# Patient Record
Sex: Male | Born: 1940
Health system: Southern US, Community
[De-identification: ages and names within clinical notes are randomized; demographics above are authoritative.]

## PROBLEM LIST (undated history)

## (undated) DIAGNOSIS — I10 Essential (primary) hypertension: Secondary | ICD-10-CM

## (undated) DIAGNOSIS — F419 Anxiety disorder, unspecified: Secondary | ICD-10-CM

## (undated) DIAGNOSIS — B399 Histoplasmosis, unspecified: Secondary | ICD-10-CM

## (undated) DIAGNOSIS — M4800 Spinal stenosis, site unspecified: Secondary | ICD-10-CM

## (undated) DIAGNOSIS — M1990 Unspecified osteoarthritis, unspecified site: Secondary | ICD-10-CM

## (undated) HISTORY — DX: Histoplasmosis, unspecified: B39.9

## (undated) HISTORY — PX: SHOULDER ARTHROSCOPY: SHX128

---

## 2016-07-20 DIAGNOSIS — H35362 Drusen (degenerative) of macula, left eye: Secondary | ICD-10-CM | POA: Diagnosis not present

## 2016-07-20 DIAGNOSIS — H35051 Retinal neovascularization, unspecified, right eye: Secondary | ICD-10-CM | POA: Diagnosis not present

## 2016-07-20 DIAGNOSIS — H43813 Vitreous degeneration, bilateral: Secondary | ICD-10-CM | POA: Diagnosis not present

## 2016-08-20 DIAGNOSIS — M25561 Pain in right knee: Secondary | ICD-10-CM | POA: Diagnosis not present

## 2016-08-20 DIAGNOSIS — R739 Hyperglycemia, unspecified: Secondary | ICD-10-CM | POA: Diagnosis not present

## 2016-08-20 DIAGNOSIS — I10 Essential (primary) hypertension: Secondary | ICD-10-CM | POA: Diagnosis not present

## 2016-08-20 DIAGNOSIS — Z79899 Other long term (current) drug therapy: Secondary | ICD-10-CM | POA: Diagnosis not present

## 2016-08-20 DIAGNOSIS — E559 Vitamin D deficiency, unspecified: Secondary | ICD-10-CM | POA: Diagnosis not present

## 2016-08-20 DIAGNOSIS — E7800 Pure hypercholesterolemia, unspecified: Secondary | ICD-10-CM | POA: Diagnosis not present

## 2016-11-10 DIAGNOSIS — H35051 Retinal neovascularization, unspecified, right eye: Secondary | ICD-10-CM | POA: Diagnosis not present

## 2016-11-10 DIAGNOSIS — H43813 Vitreous degeneration, bilateral: Secondary | ICD-10-CM | POA: Diagnosis not present

## 2016-11-10 DIAGNOSIS — H35362 Drusen (degenerative) of macula, left eye: Secondary | ICD-10-CM | POA: Diagnosis not present

## 2016-11-10 DIAGNOSIS — B399 Histoplasmosis, unspecified: Secondary | ICD-10-CM | POA: Diagnosis not present

## 2016-12-18 DIAGNOSIS — M25561 Pain in right knee: Secondary | ICD-10-CM | POA: Diagnosis not present

## 2016-12-18 DIAGNOSIS — E7800 Pure hypercholesterolemia, unspecified: Secondary | ICD-10-CM | POA: Diagnosis not present

## 2016-12-18 DIAGNOSIS — I10 Essential (primary) hypertension: Secondary | ICD-10-CM | POA: Diagnosis not present

## 2016-12-18 DIAGNOSIS — M25562 Pain in left knee: Secondary | ICD-10-CM | POA: Diagnosis not present

## 2016-12-18 DIAGNOSIS — Z79899 Other long term (current) drug therapy: Secondary | ICD-10-CM | POA: Diagnosis not present

## 2016-12-24 DIAGNOSIS — M7042 Prepatellar bursitis, left knee: Secondary | ICD-10-CM | POA: Diagnosis not present

## 2016-12-24 DIAGNOSIS — M1711 Unilateral primary osteoarthritis, right knee: Secondary | ICD-10-CM | POA: Diagnosis not present

## 2016-12-24 DIAGNOSIS — M11261 Other chondrocalcinosis, right knee: Secondary | ICD-10-CM | POA: Diagnosis not present

## 2016-12-24 DIAGNOSIS — M1712 Unilateral primary osteoarthritis, left knee: Secondary | ICD-10-CM | POA: Diagnosis not present

## 2016-12-24 DIAGNOSIS — M11262 Other chondrocalcinosis, left knee: Secondary | ICD-10-CM | POA: Diagnosis not present

## 2017-01-25 DIAGNOSIS — M94261 Chondromalacia, right knee: Secondary | ICD-10-CM | POA: Diagnosis not present

## 2017-01-25 DIAGNOSIS — M7042 Prepatellar bursitis, left knee: Secondary | ICD-10-CM | POA: Diagnosis not present

## 2017-04-18 DIAGNOSIS — I10 Essential (primary) hypertension: Secondary | ICD-10-CM | POA: Diagnosis not present

## 2017-04-18 DIAGNOSIS — F5104 Psychophysiologic insomnia: Secondary | ICD-10-CM | POA: Diagnosis not present

## 2017-04-18 DIAGNOSIS — Z79899 Other long term (current) drug therapy: Secondary | ICD-10-CM | POA: Diagnosis not present

## 2017-04-18 DIAGNOSIS — E7800 Pure hypercholesterolemia, unspecified: Secondary | ICD-10-CM | POA: Diagnosis not present

## 2017-04-27 DIAGNOSIS — H31011 Macula scars of posterior pole (postinflammatory) (post-traumatic), right eye: Secondary | ICD-10-CM | POA: Diagnosis not present

## 2017-04-27 DIAGNOSIS — B399 Histoplasmosis, unspecified: Secondary | ICD-10-CM | POA: Diagnosis not present

## 2017-04-27 DIAGNOSIS — H43813 Vitreous degeneration, bilateral: Secondary | ICD-10-CM | POA: Diagnosis not present

## 2017-04-27 DIAGNOSIS — H35362 Drusen (degenerative) of macula, left eye: Secondary | ICD-10-CM | POA: Diagnosis not present

## 2017-06-23 DIAGNOSIS — M170 Bilateral primary osteoarthritis of knee: Secondary | ICD-10-CM | POA: Diagnosis not present

## 2017-06-23 DIAGNOSIS — M659 Synovitis and tenosynovitis, unspecified: Secondary | ICD-10-CM | POA: Diagnosis not present

## 2017-06-23 DIAGNOSIS — M25461 Effusion, right knee: Secondary | ICD-10-CM | POA: Diagnosis not present

## 2017-07-27 DIAGNOSIS — H35051 Retinal neovascularization, unspecified, right eye: Secondary | ICD-10-CM | POA: Diagnosis not present

## 2017-07-27 DIAGNOSIS — B399 Histoplasmosis, unspecified: Secondary | ICD-10-CM | POA: Diagnosis not present

## 2017-07-27 DIAGNOSIS — H35362 Drusen (degenerative) of macula, left eye: Secondary | ICD-10-CM | POA: Diagnosis not present

## 2017-07-27 DIAGNOSIS — H43813 Vitreous degeneration, bilateral: Secondary | ICD-10-CM | POA: Diagnosis not present

## 2017-07-28 DIAGNOSIS — M25461 Effusion, right knee: Secondary | ICD-10-CM | POA: Diagnosis not present

## 2017-07-28 DIAGNOSIS — M1711 Unilateral primary osteoarthritis, right knee: Secondary | ICD-10-CM | POA: Diagnosis not present

## 2017-07-28 DIAGNOSIS — M1712 Unilateral primary osteoarthritis, left knee: Secondary | ICD-10-CM | POA: Diagnosis not present

## 2017-08-26 DIAGNOSIS — Z125 Encounter for screening for malignant neoplasm of prostate: Secondary | ICD-10-CM | POA: Diagnosis not present

## 2017-08-26 DIAGNOSIS — I10 Essential (primary) hypertension: Secondary | ICD-10-CM | POA: Diagnosis not present

## 2017-08-26 DIAGNOSIS — E7800 Pure hypercholesterolemia, unspecified: Secondary | ICD-10-CM | POA: Diagnosis not present

## 2017-08-26 DIAGNOSIS — Z79899 Other long term (current) drug therapy: Secondary | ICD-10-CM | POA: Diagnosis not present

## 2017-08-26 DIAGNOSIS — R0602 Shortness of breath: Secondary | ICD-10-CM | POA: Diagnosis not present

## 2017-08-26 DIAGNOSIS — Z0000 Encounter for general adult medical examination without abnormal findings: Secondary | ICD-10-CM | POA: Diagnosis not present

## 2017-09-05 DIAGNOSIS — M1711 Unilateral primary osteoarthritis, right knee: Secondary | ICD-10-CM | POA: Diagnosis not present

## 2017-12-23 DIAGNOSIS — I10 Essential (primary) hypertension: Secondary | ICD-10-CM | POA: Diagnosis not present

## 2017-12-23 DIAGNOSIS — F5104 Psychophysiologic insomnia: Secondary | ICD-10-CM | POA: Diagnosis not present

## 2017-12-23 DIAGNOSIS — Z79899 Other long term (current) drug therapy: Secondary | ICD-10-CM | POA: Diagnosis not present

## 2017-12-23 DIAGNOSIS — E349 Endocrine disorder, unspecified: Secondary | ICD-10-CM | POA: Diagnosis not present

## 2017-12-23 DIAGNOSIS — E7800 Pure hypercholesterolemia, unspecified: Secondary | ICD-10-CM | POA: Diagnosis not present

## 2018-01-25 DIAGNOSIS — H35362 Drusen (degenerative) of macula, left eye: Secondary | ICD-10-CM | POA: Diagnosis not present

## 2018-01-25 DIAGNOSIS — B399 Histoplasmosis, unspecified: Secondary | ICD-10-CM | POA: Diagnosis not present

## 2018-01-25 DIAGNOSIS — H31011 Macula scars of posterior pole (postinflammatory) (post-traumatic), right eye: Secondary | ICD-10-CM | POA: Diagnosis not present

## 2018-01-25 DIAGNOSIS — H35051 Retinal neovascularization, unspecified, right eye: Secondary | ICD-10-CM | POA: Diagnosis not present

## 2018-04-24 DIAGNOSIS — I10 Essential (primary) hypertension: Secondary | ICD-10-CM | POA: Diagnosis not present

## 2018-04-24 DIAGNOSIS — F5104 Psychophysiologic insomnia: Secondary | ICD-10-CM | POA: Diagnosis not present

## 2018-04-24 DIAGNOSIS — E349 Endocrine disorder, unspecified: Secondary | ICD-10-CM | POA: Diagnosis not present

## 2018-04-24 DIAGNOSIS — Z79899 Other long term (current) drug therapy: Secondary | ICD-10-CM | POA: Diagnosis not present

## 2018-04-24 DIAGNOSIS — E7800 Pure hypercholesterolemia, unspecified: Secondary | ICD-10-CM | POA: Diagnosis not present

## 2018-05-19 DIAGNOSIS — H35051 Retinal neovascularization, unspecified, right eye: Secondary | ICD-10-CM | POA: Diagnosis not present

## 2018-05-19 DIAGNOSIS — H35362 Drusen (degenerative) of macula, left eye: Secondary | ICD-10-CM | POA: Diagnosis not present

## 2018-05-19 DIAGNOSIS — H43813 Vitreous degeneration, bilateral: Secondary | ICD-10-CM | POA: Diagnosis not present

## 2018-05-19 DIAGNOSIS — B399 Histoplasmosis, unspecified: Secondary | ICD-10-CM | POA: Diagnosis not present

## 2018-06-21 DIAGNOSIS — H35051 Retinal neovascularization, unspecified, right eye: Secondary | ICD-10-CM | POA: Diagnosis not present

## 2018-06-21 DIAGNOSIS — B399 Histoplasmosis, unspecified: Secondary | ICD-10-CM | POA: Diagnosis not present

## 2018-06-21 DIAGNOSIS — H43813 Vitreous degeneration, bilateral: Secondary | ICD-10-CM | POA: Diagnosis not present

## 2018-06-21 DIAGNOSIS — H35362 Drusen (degenerative) of macula, left eye: Secondary | ICD-10-CM | POA: Diagnosis not present

## 2018-08-01 DIAGNOSIS — I10 Essential (primary) hypertension: Secondary | ICD-10-CM | POA: Diagnosis not present

## 2018-08-01 DIAGNOSIS — E7800 Pure hypercholesterolemia, unspecified: Secondary | ICD-10-CM | POA: Diagnosis not present

## 2018-08-01 DIAGNOSIS — F5104 Psychophysiologic insomnia: Secondary | ICD-10-CM | POA: Diagnosis not present

## 2018-08-01 DIAGNOSIS — Z79899 Other long term (current) drug therapy: Secondary | ICD-10-CM | POA: Diagnosis not present

## 2018-08-01 DIAGNOSIS — F339 Major depressive disorder, recurrent, unspecified: Secondary | ICD-10-CM | POA: Diagnosis not present

## 2018-08-02 DIAGNOSIS — B399 Histoplasmosis, unspecified: Secondary | ICD-10-CM | POA: Diagnosis not present

## 2018-08-02 DIAGNOSIS — H31011 Macula scars of posterior pole (postinflammatory) (post-traumatic), right eye: Secondary | ICD-10-CM | POA: Diagnosis not present

## 2018-08-02 DIAGNOSIS — H35051 Retinal neovascularization, unspecified, right eye: Secondary | ICD-10-CM | POA: Diagnosis not present

## 2018-08-02 DIAGNOSIS — H35362 Drusen (degenerative) of macula, left eye: Secondary | ICD-10-CM | POA: Diagnosis not present

## 2018-09-22 DIAGNOSIS — B399 Histoplasmosis, unspecified: Secondary | ICD-10-CM | POA: Diagnosis not present

## 2018-09-22 DIAGNOSIS — H35051 Retinal neovascularization, unspecified, right eye: Secondary | ICD-10-CM | POA: Diagnosis not present

## 2018-09-22 DIAGNOSIS — H31011 Macula scars of posterior pole (postinflammatory) (post-traumatic), right eye: Secondary | ICD-10-CM | POA: Diagnosis not present

## 2018-09-22 DIAGNOSIS — H35362 Drusen (degenerative) of macula, left eye: Secondary | ICD-10-CM | POA: Diagnosis not present

## 2018-11-07 DIAGNOSIS — Z79899 Other long term (current) drug therapy: Secondary | ICD-10-CM | POA: Diagnosis not present

## 2018-11-07 DIAGNOSIS — Z125 Encounter for screening for malignant neoplasm of prostate: Secondary | ICD-10-CM | POA: Diagnosis not present

## 2018-11-07 DIAGNOSIS — E7800 Pure hypercholesterolemia, unspecified: Secondary | ICD-10-CM | POA: Diagnosis not present

## 2018-11-07 DIAGNOSIS — R0602 Shortness of breath: Secondary | ICD-10-CM | POA: Diagnosis not present

## 2018-11-07 DIAGNOSIS — I10 Essential (primary) hypertension: Secondary | ICD-10-CM | POA: Diagnosis not present

## 2018-11-07 DIAGNOSIS — Z0000 Encounter for general adult medical examination without abnormal findings: Secondary | ICD-10-CM | POA: Diagnosis not present

## 2019-02-27 DIAGNOSIS — H43813 Vitreous degeneration, bilateral: Secondary | ICD-10-CM | POA: Diagnosis not present

## 2019-02-27 DIAGNOSIS — H35051 Retinal neovascularization, unspecified, right eye: Secondary | ICD-10-CM | POA: Diagnosis not present

## 2019-02-27 DIAGNOSIS — H31011 Macula scars of posterior pole (postinflammatory) (post-traumatic), right eye: Secondary | ICD-10-CM | POA: Diagnosis not present

## 2019-02-27 DIAGNOSIS — B399 Histoplasmosis, unspecified: Secondary | ICD-10-CM | POA: Diagnosis not present

## 2019-03-09 DIAGNOSIS — I10 Essential (primary) hypertension: Secondary | ICD-10-CM | POA: Diagnosis not present

## 2019-03-09 DIAGNOSIS — E7800 Pure hypercholesterolemia, unspecified: Secondary | ICD-10-CM | POA: Diagnosis not present

## 2019-03-09 DIAGNOSIS — F5104 Psychophysiologic insomnia: Secondary | ICD-10-CM | POA: Diagnosis not present

## 2019-03-09 DIAGNOSIS — Z79899 Other long term (current) drug therapy: Secondary | ICD-10-CM | POA: Diagnosis not present

## 2019-03-09 DIAGNOSIS — D539 Nutritional anemia, unspecified: Secondary | ICD-10-CM | POA: Diagnosis not present

## 2019-03-09 DIAGNOSIS — B029 Zoster without complications: Secondary | ICD-10-CM | POA: Diagnosis not present

## 2019-03-09 DIAGNOSIS — Z1159 Encounter for screening for other viral diseases: Secondary | ICD-10-CM | POA: Diagnosis not present

## 2019-06-22 DIAGNOSIS — B399 Histoplasmosis, unspecified: Secondary | ICD-10-CM | POA: Diagnosis not present

## 2019-06-22 DIAGNOSIS — H35051 Retinal neovascularization, unspecified, right eye: Secondary | ICD-10-CM | POA: Diagnosis not present

## 2019-07-09 DIAGNOSIS — Z79899 Other long term (current) drug therapy: Secondary | ICD-10-CM | POA: Diagnosis not present

## 2019-07-09 DIAGNOSIS — I10 Essential (primary) hypertension: Secondary | ICD-10-CM | POA: Diagnosis not present

## 2019-07-09 DIAGNOSIS — M129 Arthropathy, unspecified: Secondary | ICD-10-CM | POA: Diagnosis not present

## 2019-07-09 DIAGNOSIS — E7800 Pure hypercholesterolemia, unspecified: Secondary | ICD-10-CM | POA: Diagnosis not present

## 2019-07-09 DIAGNOSIS — Z1159 Encounter for screening for other viral diseases: Secondary | ICD-10-CM | POA: Diagnosis not present

## 2019-10-24 DIAGNOSIS — H31011 Macula scars of posterior pole (postinflammatory) (post-traumatic), right eye: Secondary | ICD-10-CM | POA: Diagnosis not present

## 2019-10-24 DIAGNOSIS — H35051 Retinal neovascularization, unspecified, right eye: Secondary | ICD-10-CM | POA: Diagnosis not present

## 2019-10-24 DIAGNOSIS — H35362 Drusen (degenerative) of macula, left eye: Secondary | ICD-10-CM | POA: Diagnosis not present

## 2019-10-24 DIAGNOSIS — B399 Histoplasmosis, unspecified: Secondary | ICD-10-CM | POA: Diagnosis not present

## 2019-11-27 DIAGNOSIS — Z1331 Encounter for screening for depression: Secondary | ICD-10-CM | POA: Diagnosis not present

## 2019-11-27 DIAGNOSIS — E7800 Pure hypercholesterolemia, unspecified: Secondary | ICD-10-CM | POA: Diagnosis not present

## 2019-11-27 DIAGNOSIS — Z0000 Encounter for general adult medical examination without abnormal findings: Secondary | ICD-10-CM | POA: Diagnosis not present

## 2019-11-27 DIAGNOSIS — Z1339 Encounter for screening examination for other mental health and behavioral disorders: Secondary | ICD-10-CM | POA: Diagnosis not present

## 2019-11-27 DIAGNOSIS — Z1159 Encounter for screening for other viral diseases: Secondary | ICD-10-CM | POA: Diagnosis not present

## 2019-11-27 DIAGNOSIS — Z125 Encounter for screening for malignant neoplasm of prostate: Secondary | ICD-10-CM | POA: Diagnosis not present

## 2019-11-27 DIAGNOSIS — Z79899 Other long term (current) drug therapy: Secondary | ICD-10-CM | POA: Diagnosis not present

## 2019-11-27 DIAGNOSIS — I10 Essential (primary) hypertension: Secondary | ICD-10-CM | POA: Diagnosis not present

## 2019-11-27 DIAGNOSIS — R0602 Shortness of breath: Secondary | ICD-10-CM | POA: Diagnosis not present

## 2020-03-28 DIAGNOSIS — M4800 Spinal stenosis, site unspecified: Secondary | ICD-10-CM | POA: Diagnosis not present

## 2020-03-28 DIAGNOSIS — Z79899 Other long term (current) drug therapy: Secondary | ICD-10-CM | POA: Diagnosis not present

## 2020-03-28 DIAGNOSIS — G479 Sleep disorder, unspecified: Secondary | ICD-10-CM | POA: Diagnosis not present

## 2020-03-28 DIAGNOSIS — I10 Essential (primary) hypertension: Secondary | ICD-10-CM | POA: Diagnosis not present

## 2020-03-28 DIAGNOSIS — G629 Polyneuropathy, unspecified: Secondary | ICD-10-CM | POA: Diagnosis not present

## 2020-05-21 DIAGNOSIS — M5136 Other intervertebral disc degeneration, lumbar region: Secondary | ICD-10-CM | POA: Diagnosis not present

## 2020-05-21 DIAGNOSIS — M545 Low back pain, unspecified: Secondary | ICD-10-CM | POA: Diagnosis not present

## 2020-05-27 DIAGNOSIS — G8929 Other chronic pain: Secondary | ICD-10-CM | POA: Diagnosis not present

## 2020-05-27 DIAGNOSIS — M545 Low back pain, unspecified: Secondary | ICD-10-CM | POA: Diagnosis not present

## 2020-06-03 DIAGNOSIS — G8929 Other chronic pain: Secondary | ICD-10-CM | POA: Diagnosis not present

## 2020-06-03 DIAGNOSIS — M545 Low back pain, unspecified: Secondary | ICD-10-CM | POA: Diagnosis not present

## 2020-06-09 DIAGNOSIS — M545 Low back pain, unspecified: Secondary | ICD-10-CM | POA: Diagnosis not present

## 2020-06-09 DIAGNOSIS — G8929 Other chronic pain: Secondary | ICD-10-CM | POA: Diagnosis not present

## 2020-08-10 DIAGNOSIS — I10 Essential (primary) hypertension: Secondary | ICD-10-CM | POA: Diagnosis not present

## 2020-08-10 DIAGNOSIS — G479 Sleep disorder, unspecified: Secondary | ICD-10-CM | POA: Diagnosis not present

## 2020-08-10 DIAGNOSIS — Z1159 Encounter for screening for other viral diseases: Secondary | ICD-10-CM | POA: Diagnosis not present

## 2020-08-10 DIAGNOSIS — G629 Polyneuropathy, unspecified: Secondary | ICD-10-CM | POA: Diagnosis not present

## 2020-08-10 DIAGNOSIS — Z79899 Other long term (current) drug therapy: Secondary | ICD-10-CM | POA: Diagnosis not present

## 2020-08-10 DIAGNOSIS — M4800 Spinal stenosis, site unspecified: Secondary | ICD-10-CM | POA: Diagnosis not present

## 2020-09-24 DIAGNOSIS — S93402A Sprain of unspecified ligament of left ankle, initial encounter: Secondary | ICD-10-CM | POA: Diagnosis not present

## 2020-09-24 DIAGNOSIS — I10 Essential (primary) hypertension: Secondary | ICD-10-CM | POA: Diagnosis not present

## 2020-09-24 DIAGNOSIS — M25572 Pain in left ankle and joints of left foot: Secondary | ICD-10-CM | POA: Diagnosis not present

## 2020-09-24 DIAGNOSIS — M79671 Pain in right foot: Secondary | ICD-10-CM | POA: Diagnosis not present

## 2020-10-21 DIAGNOSIS — M76821 Posterior tibial tendinitis, right leg: Secondary | ICD-10-CM | POA: Diagnosis not present

## 2020-10-21 DIAGNOSIS — M7741 Metatarsalgia, right foot: Secondary | ICD-10-CM | POA: Diagnosis not present

## 2020-10-21 DIAGNOSIS — M7742 Metatarsalgia, left foot: Secondary | ICD-10-CM | POA: Diagnosis not present

## 2020-11-22 DIAGNOSIS — G629 Polyneuropathy, unspecified: Secondary | ICD-10-CM | POA: Diagnosis not present

## 2020-11-22 DIAGNOSIS — Z1159 Encounter for screening for other viral diseases: Secondary | ICD-10-CM | POA: Diagnosis not present

## 2020-11-22 DIAGNOSIS — Z79899 Other long term (current) drug therapy: Secondary | ICD-10-CM | POA: Diagnosis not present

## 2020-11-22 DIAGNOSIS — I10 Essential (primary) hypertension: Secondary | ICD-10-CM | POA: Diagnosis not present

## 2020-11-22 DIAGNOSIS — R0602 Shortness of breath: Secondary | ICD-10-CM | POA: Diagnosis not present

## 2020-11-22 DIAGNOSIS — E559 Vitamin D deficiency, unspecified: Secondary | ICD-10-CM | POA: Diagnosis not present

## 2020-11-22 DIAGNOSIS — E7800 Pure hypercholesterolemia, unspecified: Secondary | ICD-10-CM | POA: Diagnosis not present

## 2020-12-23 DIAGNOSIS — G629 Polyneuropathy, unspecified: Secondary | ICD-10-CM | POA: Diagnosis not present

## 2020-12-23 DIAGNOSIS — F5104 Psychophysiologic insomnia: Secondary | ICD-10-CM | POA: Diagnosis not present

## 2020-12-23 DIAGNOSIS — F339 Major depressive disorder, recurrent, unspecified: Secondary | ICD-10-CM | POA: Diagnosis not present

## 2020-12-23 DIAGNOSIS — M5030 Other cervical disc degeneration, unspecified cervical region: Secondary | ICD-10-CM | POA: Diagnosis not present

## 2021-01-06 DIAGNOSIS — R0602 Shortness of breath: Secondary | ICD-10-CM | POA: Diagnosis not present

## 2021-01-06 DIAGNOSIS — R9431 Abnormal electrocardiogram [ECG] [EKG]: Secondary | ICD-10-CM | POA: Diagnosis not present

## 2021-02-23 DIAGNOSIS — Z79899 Other long term (current) drug therapy: Secondary | ICD-10-CM | POA: Diagnosis not present

## 2021-02-23 DIAGNOSIS — F339 Major depressive disorder, recurrent, unspecified: Secondary | ICD-10-CM | POA: Diagnosis not present

## 2021-02-23 DIAGNOSIS — I10 Essential (primary) hypertension: Secondary | ICD-10-CM | POA: Diagnosis not present

## 2021-02-23 DIAGNOSIS — Z1159 Encounter for screening for other viral diseases: Secondary | ICD-10-CM | POA: Diagnosis not present

## 2021-02-23 DIAGNOSIS — M5030 Other cervical disc degeneration, unspecified cervical region: Secondary | ICD-10-CM | POA: Diagnosis not present

## 2021-02-23 DIAGNOSIS — F5104 Psychophysiologic insomnia: Secondary | ICD-10-CM | POA: Diagnosis not present

## 2021-05-19 DIAGNOSIS — I639 Cerebral infarction, unspecified: Secondary | ICD-10-CM

## 2021-05-19 HISTORY — DX: Cerebral infarction, unspecified: I63.9

## 2021-05-26 DIAGNOSIS — Z1159 Encounter for screening for other viral diseases: Secondary | ICD-10-CM | POA: Diagnosis not present

## 2021-05-26 DIAGNOSIS — M5030 Other cervical disc degeneration, unspecified cervical region: Secondary | ICD-10-CM | POA: Diagnosis not present

## 2021-05-26 DIAGNOSIS — M779 Enthesopathy, unspecified: Secondary | ICD-10-CM | POA: Diagnosis not present

## 2021-05-26 DIAGNOSIS — M129 Arthropathy, unspecified: Secondary | ICD-10-CM | POA: Diagnosis not present

## 2021-05-26 DIAGNOSIS — M545 Low back pain, unspecified: Secondary | ICD-10-CM | POA: Diagnosis not present

## 2021-05-26 DIAGNOSIS — F339 Major depressive disorder, recurrent, unspecified: Secondary | ICD-10-CM | POA: Diagnosis not present

## 2021-05-26 DIAGNOSIS — I10 Essential (primary) hypertension: Secondary | ICD-10-CM | POA: Diagnosis not present

## 2021-05-26 DIAGNOSIS — R030 Elevated blood-pressure reading, without diagnosis of hypertension: Secondary | ICD-10-CM | POA: Diagnosis not present

## 2021-05-26 DIAGNOSIS — R202 Paresthesia of skin: Secondary | ICD-10-CM | POA: Diagnosis not present

## 2021-05-26 DIAGNOSIS — M79671 Pain in right foot: Secondary | ICD-10-CM | POA: Diagnosis not present

## 2021-05-26 DIAGNOSIS — Z79899 Other long term (current) drug therapy: Secondary | ICD-10-CM | POA: Diagnosis not present

## 2021-05-26 DIAGNOSIS — F5104 Psychophysiologic insomnia: Secondary | ICD-10-CM | POA: Diagnosis not present

## 2021-05-26 DIAGNOSIS — G8929 Other chronic pain: Secondary | ICD-10-CM | POA: Diagnosis not present

## 2021-06-09 DIAGNOSIS — G8929 Other chronic pain: Secondary | ICD-10-CM | POA: Diagnosis not present

## 2021-06-09 DIAGNOSIS — M545 Low back pain, unspecified: Secondary | ICD-10-CM | POA: Diagnosis not present

## 2021-06-09 DIAGNOSIS — M79671 Pain in right foot: Secondary | ICD-10-CM | POA: Diagnosis not present

## 2021-06-09 DIAGNOSIS — M779 Enthesopathy, unspecified: Secondary | ICD-10-CM | POA: Diagnosis not present

## 2021-06-09 DIAGNOSIS — I10 Essential (primary) hypertension: Secondary | ICD-10-CM | POA: Diagnosis not present

## 2021-06-15 ENCOUNTER — Encounter (HOSPITAL_BASED_OUTPATIENT_CLINIC_OR_DEPARTMENT_OTHER): Payer: Self-pay | Admitting: Urology

## 2021-06-15 ENCOUNTER — Other Ambulatory Visit: Payer: Self-pay

## 2021-06-15 ENCOUNTER — Emergency Department (HOSPITAL_BASED_OUTPATIENT_CLINIC_OR_DEPARTMENT_OTHER): Payer: PPO

## 2021-06-15 ENCOUNTER — Inpatient Hospital Stay (HOSPITAL_BASED_OUTPATIENT_CLINIC_OR_DEPARTMENT_OTHER)
Admission: EM | Admit: 2021-06-15 | Discharge: 2021-06-19 | DRG: 065 | Disposition: A | Discharge status: Skilled Nursing Facility | Payer: PPO | Source: Ambulatory Visit | Attending: Internal Medicine | Admitting: Internal Medicine

## 2021-06-15 DIAGNOSIS — E785 Hyperlipidemia, unspecified: Secondary | ICD-10-CM | POA: Diagnosis present

## 2021-06-15 DIAGNOSIS — Z20822 Contact with and (suspected) exposure to covid-19: Secondary | ICD-10-CM | POA: Diagnosis present

## 2021-06-15 DIAGNOSIS — M4800 Spinal stenosis, site unspecified: Secondary | ICD-10-CM | POA: Diagnosis present

## 2021-06-15 DIAGNOSIS — I6523 Occlusion and stenosis of bilateral carotid arteries: Secondary | ICD-10-CM | POA: Diagnosis present

## 2021-06-15 DIAGNOSIS — Z888 Allergy status to other drugs, medicaments and biological substances status: Secondary | ICD-10-CM

## 2021-06-15 DIAGNOSIS — I082 Rheumatic disorders of both aortic and tricuspid valves: Secondary | ICD-10-CM | POA: Diagnosis present

## 2021-06-15 DIAGNOSIS — I672 Cerebral atherosclerosis: Secondary | ICD-10-CM | POA: Diagnosis present

## 2021-06-15 DIAGNOSIS — I639 Cerebral infarction, unspecified: Secondary | ICD-10-CM | POA: Diagnosis not present

## 2021-06-15 DIAGNOSIS — G4700 Insomnia, unspecified: Secondary | ICD-10-CM | POA: Diagnosis present

## 2021-06-15 DIAGNOSIS — R531 Weakness: Secondary | ICD-10-CM | POA: Insufficient documentation

## 2021-06-15 DIAGNOSIS — R911 Solitary pulmonary nodule: Secondary | ICD-10-CM | POA: Diagnosis present

## 2021-06-15 DIAGNOSIS — G8194 Hemiplegia, unspecified affecting left nondominant side: Secondary | ICD-10-CM | POA: Diagnosis present

## 2021-06-15 DIAGNOSIS — R29703 NIHSS score 3: Secondary | ICD-10-CM | POA: Diagnosis present

## 2021-06-15 DIAGNOSIS — M6281 Muscle weakness (generalized): Secondary | ICD-10-CM | POA: Diagnosis not present

## 2021-06-15 DIAGNOSIS — I6330 Cerebral infarction due to thrombosis of unspecified cerebral artery: Secondary | ICD-10-CM

## 2021-06-15 DIAGNOSIS — M1990 Unspecified osteoarthritis, unspecified site: Secondary | ICD-10-CM | POA: Diagnosis present

## 2021-06-15 DIAGNOSIS — I6381 Other cerebral infarction due to occlusion or stenosis of small artery: Secondary | ICD-10-CM | POA: Diagnosis not present

## 2021-06-15 DIAGNOSIS — Z9181 History of falling: Secondary | ICD-10-CM

## 2021-06-15 DIAGNOSIS — I10 Essential (primary) hypertension: Secondary | ICD-10-CM | POA: Diagnosis present

## 2021-06-15 DIAGNOSIS — Z880 Allergy status to penicillin: Secondary | ICD-10-CM

## 2021-06-15 DIAGNOSIS — F32A Depression, unspecified: Secondary | ICD-10-CM | POA: Diagnosis present

## 2021-06-15 DIAGNOSIS — R9431 Abnormal electrocardiogram [ECG] [EKG]: Secondary | ICD-10-CM | POA: Diagnosis not present

## 2021-06-15 DIAGNOSIS — Z885 Allergy status to narcotic agent status: Secondary | ICD-10-CM

## 2021-06-15 DIAGNOSIS — R29898 Other symptoms and signs involving the musculoskeletal system: Secondary | ICD-10-CM

## 2021-06-15 DIAGNOSIS — F419 Anxiety disorder, unspecified: Secondary | ICD-10-CM | POA: Diagnosis present

## 2021-06-15 DIAGNOSIS — M25552 Pain in left hip: Secondary | ICD-10-CM | POA: Diagnosis not present

## 2021-06-15 HISTORY — DX: Essential (primary) hypertension: I10

## 2021-06-15 HISTORY — DX: Anxiety disorder, unspecified: F41.9

## 2021-06-15 HISTORY — DX: Unspecified osteoarthritis, unspecified site: M19.90

## 2021-06-15 HISTORY — DX: Spinal stenosis, site unspecified: M48.00

## 2021-06-15 LAB — CBC WITH DIFFERENTIAL/PLATELET
Abs Immature Granulocytes: 0.01 10*3/uL (ref 0.00–0.07)
Basophils Absolute: 0 10*3/uL (ref 0.0–0.1)
Basophils Relative: 1 %
Eosinophils Absolute: 0 10*3/uL (ref 0.0–0.5)
Eosinophils Relative: 0 %
HCT: 41.7 % (ref 39.0–52.0)
Hemoglobin: 14.4 g/dL (ref 13.0–17.0)
Immature Granulocytes: 0 %
Lymphocytes Relative: 9 %
Lymphs Abs: 0.4 10*3/uL — ABNORMAL LOW (ref 0.7–4.0)
MCH: 34.1 pg — ABNORMAL HIGH (ref 26.0–34.0)
MCHC: 34.5 g/dL (ref 30.0–36.0)
MCV: 98.8 fL (ref 80.0–100.0)
Monocytes Absolute: 0.3 10*3/uL (ref 0.1–1.0)
Monocytes Relative: 6 %
Neutro Abs: 3.7 10*3/uL (ref 1.7–7.7)
Neutrophils Relative %: 84 %
Platelets: 161 10*3/uL (ref 150–400)
RBC: 4.22 MIL/uL (ref 4.22–5.81)
RDW: 11.7 % (ref 11.5–15.5)
WBC: 4.4 10*3/uL (ref 4.0–10.5)
nRBC: 0 % (ref 0.0–0.2)

## 2021-06-15 LAB — URINALYSIS, ROUTINE W REFLEX MICROSCOPIC
Bilirubin Urine: NEGATIVE
Glucose, UA: NEGATIVE mg/dL
Hgb urine dipstick: NEGATIVE
Ketones, ur: 15 mg/dL — AB
Leukocytes,Ua: NEGATIVE
Nitrite: NEGATIVE
Protein, ur: NEGATIVE mg/dL
Specific Gravity, Urine: 1.03 (ref 1.005–1.030)
pH: 5.5 (ref 5.0–8.0)

## 2021-06-15 LAB — RESP PANEL BY RT-PCR (FLU A&B, COVID) ARPGX2
Influenza A by PCR: NEGATIVE
Influenza B by PCR: NEGATIVE
SARS Coronavirus 2 by RT PCR: NEGATIVE

## 2021-06-15 LAB — COMPREHENSIVE METABOLIC PANEL
ALT: 34 U/L (ref 0–44)
AST: 34 U/L (ref 15–41)
Albumin: 4.5 g/dL (ref 3.5–5.0)
Alkaline Phosphatase: 87 U/L (ref 38–126)
Anion gap: 9 (ref 5–15)
BUN: 19 mg/dL (ref 8–23)
CO2: 27 mmol/L (ref 22–32)
Calcium: 9.5 mg/dL (ref 8.9–10.3)
Chloride: 101 mmol/L (ref 98–111)
Creatinine, Ser: 0.81 mg/dL (ref 0.61–1.24)
GFR, Estimated: >60 mL/min (ref >60)
Glucose, Bld: 119 mg/dL — ABNORMAL HIGH (ref 70–99)
Potassium: 4.2 mmol/L (ref 3.5–5.1)
Sodium: 137 mmol/L (ref 135–145)
Total Bilirubin: 0.4 mg/dL (ref 0.3–1.2)
Total Protein: 7.4 g/dL (ref 6.5–8.1)

## 2021-06-15 LAB — ETHANOL: Alcohol, Ethyl (B): <10 mg/dL (ref <10)

## 2021-06-15 LAB — RAPID URINE DRUG SCREEN, HOSP PERFORMED
Amphetamines: NOT DETECTED
Barbiturates: NOT DETECTED
Benzodiazepines: POSITIVE — AB
Cocaine: NOT DETECTED
Opiates: NOT DETECTED
Tetrahydrocannabinol: NOT DETECTED

## 2021-06-15 LAB — PROTIME-INR
INR: 1.1 (ref 0.8–1.2)
Prothrombin Time: 13.9 seconds (ref 11.4–15.2)

## 2021-06-15 LAB — APTT: aPTT: 26 seconds (ref 24–36)

## 2021-06-15 MED ORDER — IOHEXOL 350 MG/ML SOLN
80.0000 mL | Freq: Once | INTRAVENOUS | Status: AC | PRN
  Administered 2021-06-15: 20:00:00 80 mL via INTRAVENOUS

## 2021-06-15 MED ORDER — ALPRAZOLAM 0.5 MG PO TABS
1.0000 mg | ORAL_TABLET | Freq: Once | ORAL | Status: AC
  Administered 2021-06-15: 22:00:00 1 mg via ORAL
  Filled 2021-06-15: qty 2

## 2021-06-15 NOTE — ED Notes (Signed)
Pt care taken, awaiting ct and x ray, no complaints at this time

## 2021-06-15 NOTE — ED Triage Notes (Signed)
Fall yesterday, states tingling and numbness to legs x 1 year.  States numbness to left leg made him fall, c/o left hip pain, unable to stand at this time, c/o left hip pain.   NAD at this time.

## 2021-06-15 NOTE — Progress Notes (Signed)
  [facility] will assume care on arrival to accepting facility. Until arrival, care as per EDP. However, [facility] available 24/7 for questions and assistance.   Nursing staff please page [facility] Admits and Consults ([phone]) as soon as the patient arrives to the hospital.  James Lafalce, DO [facility]  

## 2021-06-15 NOTE — ED Provider Notes (Signed)
Emergency Department Provider Note   I have reviewed the triage vital signs and the nursing notes.   HISTORY  Chief Complaint Fall   HPI Charles Hale is a 80 y.o. male presents to the ED with new onset left leg weakness and fall.  Patient tells me that he has had intermittent tingling/numbness to the feet primarily over the past year but he awoke yesterday with significant weakness in the left leg and difficulty walking.  He was able to use his elliptical machine the day before and walk without difficulty.  He is not experiencing left arm weakness.  He does note some tingling at times.  He is not having worsening numbness.  No back or neck pain.  No groin numbness. He had a fall today due to his difficulty walking and was sent here from his PCP with concern for CVA. No speech or vision change. No gait instability prior to yesterday.   Past Medical History:  Diagnosis Date   Anxiety    Arthritis    Hypertension    Spinal stenosis     Patient Active Problem List   Diagnosis Date Noted   Cerebral thrombosis with cerebral infarction 06/17/2021   Essential hypertension 06/16/2021   Depression 06/16/2021   Osteoarthritis 06/16/2021   Insomnia 06/16/2021   Acute left-sided weakness 06/15/2021    Past Surgical History:  Procedure Laterality Date   SHOULDER ARTHROSCOPY      Allergies Codeine, Penicillins, and Terbinafine hcl  History reviewed. No pertinent family history.  Social History Social History   Tobacco Use   Smoking status: Never    Passive exposure: Never   Smokeless tobacco: Never  Substance Use Topics   Alcohol use: Yes    Comment: occ   Drug use: Never    Review of Systems  Constitutional: No fever/chills Eyes: No visual changes. ENT: No sore throat. Cardiovascular: Denies chest pain. Respiratory: Denies shortness of breath. Gastrointestinal: No abdominal pain.  No nausea, no vomiting.  No diarrhea.  No constipation. Genitourinary: Negative  for dysuria. Musculoskeletal: Negative for back pain. Skin: Negative for rash. Neurological: Negative for headaches. Chronic bilateral foot tingling and left hand numbness intermittently. Left leg weakness and difficulty walking.   10-point ROS otherwise negative.  ____________________________________________   PHYSICAL EXAM:  VITAL SIGNS: ED Triage Vitals  Enc Vitals Group     BP 06/15/21 1338 (!) 167/77     Pulse Rate 06/15/21 1338 81     Resp 06/15/21 1338 18     Temp 06/15/21 1338 98 F (36.7 C)     Temp Source 06/15/21 1338 Oral     SpO2 06/15/21 1338 100 %     Weight 06/15/21 1333 182 lb (82.6 kg)     Height 06/15/21 1333 6\' 1"  (1.854 m)   Constitutional: Alert and oriented. Well appearing and in no acute distress. Eyes: Conjunctivae are normal.  Head: Atraumatic. Nose: No congestion/rhinnorhea. Mouth/Throat: Mucous membranes are moist.   Neck: No stridor.   Cardiovascular: Normal rate, regular rhythm. Good peripheral circulation. Grossly normal heart sounds.   Respiratory: Normal respiratory effort.  No retractions. Lungs CTAB. Gastrointestinal: Soft and nontender. No distention.  Musculoskeletal: No lower extremity tenderness nor edema. No gross deformities of extremities. Neurologic:  Normal speech and language. Left leg weakness and gait instability. No facial asymmetry or sensory deficit. Slight pronator drift in the LUE.  Skin:  Skin is warm, dry and intact. No rash noted.   ____________________________________________   LABS (all labs  ordered are listed, but only abnormal results are displayed)  Labs Reviewed  DIFFERENTIAL - Abnormal; Notable for the following components:      Result Value   Lymphs Abs 0.4 (*)    All other components within normal limits  COMPREHENSIVE METABOLIC PANEL - Abnormal; Notable for the following components:   Glucose, Bld 119 (*)    All other components within normal limits  RAPID URINE DRUG SCREEN, HOSP PERFORMED - Abnormal;  Notable for the following components:   Benzodiazepines POSITIVE (*)    All other components within normal limits  URINALYSIS, ROUTINE W REFLEX MICROSCOPIC - Abnormal; Notable for the following components:   Ketones, ur 15 (*)    All other components within normal limits  CBC WITH DIFFERENTIAL/PLATELET - Abnormal; Notable for the following components:   MCH 34.1 (*)    Lymphs Abs 0.4 (*)    All other components within normal limits  RESP PANEL BY RT-PCR (FLU A&B, COVID) ARPGX2  ETHANOL  PROTIME-INR  APTT  CBC  TSH  HEMOGLOBIN A1C  LIPID PANEL   ____________________________________________  EKG  EKG reviewed. Sinus rhythm. LAFB. Normal T waves. No STEMI.    ____________________________________________  RADIOLOGY  DG Hip Unilat With Pelvis 2-3 Views Left  Result Date: 06/15/2021 CLINICAL DATA:  Fall.  LEFT hip pain EXAM: DG HIP (WITH OR WITHOUT PELVIS) 2-3V LEFT COMPARISON:  None. FINDINGS: Hips are located. No evidence of pelvic fracture or sacral fracture. Dedicated view of the LEFT hip demonstrates no femoral neck fracture. IMPRESSION: No pelvic fracture or hip fracture Electronically Signed   By: Genevive Bi M.D.   On: 06/15/2021 14:22    CT head without acute hemorrhage. CTA reviewed with multiple vascular findings but no aneurysm or hemorrhage. NPH noted on differential as well.  ____________________________________________   PROCEDURES  Procedure(s) performed:   Procedures  None  ____________________________________________   INITIAL IMPRESSION / ASSESSMENT AND PLAN / ED COURSE  Pertinent labs & imaging results that were available during my care of the patient were reviewed by me and considered in my medical decision making (see chart for details).   Patient presents to the emergency department with leg weakness after waking yesterday.  He is outside of any stroke window.  Describes what sounds like neuropathy over the past year with symptoms today are  much different from his baseline neuropathy symptoms.  On exam he does seem weak in the left leg compared to the right with slight pronator drift in the left upper extremity.  No back or neck pain to strongly suggest spine issues.   Discussed with Dr. Wilford Corner who can place the patient on the consult list but requests TRH make him/team aware when patient arrives. Will need MRI.   Discussed patient's case with TR[facility] to request admission. Patient and family (if present) updated with plan. Care transferred to [facility] service.  I reviewed all nursing notes, vitals, pertinent old records, EKGs, labs, imaging (as available).  ____________________________________________  FINAL CLINICAL IMPRESSION(S) / ED DIAGNOSES  Final diagnoses:  Left leg weakness     MEDICATIONS GIVEN DURING THIS VISIT:  Medications  acetaminophen (TYLENOL) tablet 650 mg (has no administration in time range)    Or  acetaminophen (TYLENOL) 160 MG/5ML solution 650 mg (has no administration in time range)    Or  acetaminophen (TYLENOL) suppository 650 mg (has no administration in time range)  senna-docusate (Senokot-S) tablet 1 tablet (has no administration in time range)  enoxaparin (LOVENOX) injection 40 mg (40 mg  Subcutaneous Given 06/17/21 2005)  hydrALAZINE (APRESOLINE) injection 5 mg (has no administration in time range)  acetaminophen (TYLENOL) 500 MG tablet (has no administration in time range)  ALPRAZolam (XANAX) tablet 1-2 mg (1 mg Oral Given 06/18/21 0314)  sertraline (ZOLOFT) tablet 100 mg (100 mg Oral Given 06/17/21 2129)  traMADol (ULTRAM) tablet 50 mg (has no administration in time range)  clopidogrel (PLAVIX) tablet 75 mg (75 mg Oral Given 06/17/21 1119)  sertraline (ZOLOFT) 100 MG tablet (  Not Given 06/16/21 2219)  aspirin EC 81 MG tablet (  Not Given 06/16/21 2221)  enoxaparin (LOVENOX) 40 MG/0.4ML injection (  Not Given 06/16/21 2220)   stroke: mapping our early stages of recovery book (  Not Given 06/16/21  2221)  amLODipine (NORVASC) tablet 10 mg (10 mg Oral Given 06/17/21 1120)  atorvastatin (LIPITOR) tablet 40 mg (40 mg Oral Given 06/17/21 1120)  aspirin EC tablet 81 mg (81 mg Oral Given 06/17/21 1119)  iohexol (OMNIPAQUE) 350 MG/ML injection 80 mL (80 mLs Intravenous Contrast Given 06/15/21 2002)  ALPRAZolam (XANAX) tablet 1 mg (1 mg Oral Given 06/15/21 2207)  acetaminophen (TYLENOL) tablet 1,000 mg (1,000 mg Oral Given 06/16/21 0344)   stroke: mapping our early stages of recovery book ( Does not apply Given 06/16/21 2201)     Note:  This document was prepared using Dragon voice recognition software and may include unintentional dictation errors.  Alona Bene, MD, [facility]    Cordney Barstow, Arlyss Repress, MD 06/18/21 [phone]

## 2021-06-16 ENCOUNTER — Inpatient Hospital Stay (HOSPITAL_COMMUNITY): Payer: PPO

## 2021-06-16 DIAGNOSIS — I6389 Other cerebral infarction: Secondary | ICD-10-CM | POA: Diagnosis not present

## 2021-06-16 DIAGNOSIS — E785 Hyperlipidemia, unspecified: Secondary | ICD-10-CM | POA: Diagnosis present

## 2021-06-16 DIAGNOSIS — G319 Degenerative disease of nervous system, unspecified: Secondary | ICD-10-CM | POA: Diagnosis not present

## 2021-06-16 DIAGNOSIS — R531 Weakness: Secondary | ICD-10-CM

## 2021-06-16 DIAGNOSIS — M4800 Spinal stenosis, site unspecified: Secondary | ICD-10-CM | POA: Diagnosis present

## 2021-06-16 DIAGNOSIS — I679 Cerebrovascular disease, unspecified: Secondary | ICD-10-CM | POA: Diagnosis not present

## 2021-06-16 DIAGNOSIS — F32A Depression, unspecified: Secondary | ICD-10-CM | POA: Diagnosis present

## 2021-06-16 DIAGNOSIS — G4700 Insomnia, unspecified: Secondary | ICD-10-CM | POA: Diagnosis present

## 2021-06-16 DIAGNOSIS — Z888 Allergy status to other drugs, medicaments and biological substances status: Secondary | ICD-10-CM | POA: Diagnosis not present

## 2021-06-16 DIAGNOSIS — Z885 Allergy status to narcotic agent status: Secondary | ICD-10-CM | POA: Diagnosis not present

## 2021-06-16 DIAGNOSIS — F419 Anxiety disorder, unspecified: Secondary | ICD-10-CM | POA: Diagnosis present

## 2021-06-16 DIAGNOSIS — I6523 Occlusion and stenosis of bilateral carotid arteries: Secondary | ICD-10-CM | POA: Diagnosis present

## 2021-06-16 DIAGNOSIS — I672 Cerebral atherosclerosis: Secondary | ICD-10-CM | POA: Diagnosis present

## 2021-06-16 DIAGNOSIS — R29703 NIHSS score 3: Secondary | ICD-10-CM | POA: Diagnosis present

## 2021-06-16 DIAGNOSIS — I6330 Cerebral infarction due to thrombosis of unspecified cerebral artery: Secondary | ICD-10-CM | POA: Diagnosis not present

## 2021-06-16 DIAGNOSIS — I10 Essential (primary) hypertension: Secondary | ICD-10-CM

## 2021-06-16 DIAGNOSIS — G8194 Hemiplegia, unspecified affecting left nondominant side: Secondary | ICD-10-CM | POA: Diagnosis present

## 2021-06-16 DIAGNOSIS — M1990 Unspecified osteoarthritis, unspecified site: Secondary | ICD-10-CM

## 2021-06-16 DIAGNOSIS — Z880 Allergy status to penicillin: Secondary | ICD-10-CM | POA: Diagnosis not present

## 2021-06-16 DIAGNOSIS — R29818 Other symptoms and signs involving the nervous system: Secondary | ICD-10-CM | POA: Diagnosis not present

## 2021-06-16 DIAGNOSIS — I639 Cerebral infarction, unspecified: Secondary | ICD-10-CM

## 2021-06-16 DIAGNOSIS — I082 Rheumatic disorders of both aortic and tricuspid valves: Secondary | ICD-10-CM | POA: Diagnosis present

## 2021-06-16 DIAGNOSIS — Z20822 Contact with and (suspected) exposure to covid-19: Secondary | ICD-10-CM | POA: Diagnosis present

## 2021-06-16 DIAGNOSIS — I6381 Other cerebral infarction due to occlusion or stenosis of small artery: Secondary | ICD-10-CM | POA: Diagnosis present

## 2021-06-16 DIAGNOSIS — R911 Solitary pulmonary nodule: Secondary | ICD-10-CM | POA: Diagnosis present

## 2021-06-16 DIAGNOSIS — Z9181 History of falling: Secondary | ICD-10-CM | POA: Diagnosis not present

## 2021-06-16 LAB — CBC
HCT: 42.9 % (ref 39.0–52.0)
Hemoglobin: 14.6 g/dL (ref 13.0–17.0)
MCH: 33.3 pg (ref 26.0–34.0)
MCHC: 34 g/dL (ref 30.0–36.0)
MCV: 97.9 fL (ref 80.0–100.0)
Platelets: 173 10*3/uL (ref 150–400)
RBC: 4.38 MIL/uL (ref 4.22–5.81)
RDW: 11.6 % (ref 11.5–15.5)
WBC: 4.3 10*3/uL (ref 4.0–10.5)
nRBC: 0 % (ref 0.0–0.2)

## 2021-06-16 LAB — TSH: TSH: 1.001 u[IU]/mL (ref 0.350–4.500)

## 2021-06-16 LAB — DIFFERENTIAL
Abs Immature Granulocytes: 0.01 10*3/uL (ref 0.00–0.07)
Basophils Absolute: 0 10*3/uL (ref 0.0–0.1)
Basophils Relative: 1 %
Eosinophils Absolute: 0 10*3/uL (ref 0.0–0.5)
Eosinophils Relative: 1 %
Immature Granulocytes: 0 %
Lymphocytes Relative: 9 %
Lymphs Abs: 0.4 10*3/uL — ABNORMAL LOW (ref 0.7–4.0)
Monocytes Absolute: 0.3 10*3/uL (ref 0.1–1.0)
Monocytes Relative: 7 %
Neutro Abs: 3.6 10*3/uL (ref 1.7–7.7)
Neutrophils Relative %: 82 %

## 2021-06-16 MED ORDER — ACETAMINOPHEN 500 MG PO TABS
ORAL_TABLET | ORAL | Status: AC
  Filled 2021-06-16: qty 2

## 2021-06-16 MED ORDER — ACETAMINOPHEN 650 MG RE SUPP: 650.0000 mg | RECTAL | Status: DC | PRN

## 2021-06-16 MED ORDER — SERTRALINE HCL 100 MG PO TABS
ORAL_TABLET | ORAL | Status: AC
  Filled 2021-06-16: qty 1

## 2021-06-16 MED ORDER — ASPIRIN 325 MG PO TABS: 325.0000 mg | ORAL_TABLET | Freq: Every day | ORAL | Status: DC

## 2021-06-16 MED ORDER — ATORVASTATIN CALCIUM 80 MG PO TABS: 80.0000 mg | ORAL_TABLET | Freq: Every day | ORAL | Status: DC

## 2021-06-16 MED ORDER — SERTRALINE HCL 100 MG PO TABS
100.0000 mg | ORAL_TABLET | Freq: Every day | ORAL | Status: DC
  Administered 2021-06-16 – 2021-06-18 (×3): 100 mg via ORAL
  Filled 2021-06-16 (×2): qty 1

## 2021-06-16 MED ORDER — STROKE: EARLY STAGES OF RECOVERY BOOK: Freq: Once | Status: AC

## 2021-06-16 MED ORDER — CLOPIDOGREL BISULFATE 75 MG PO TABS
75.0000 mg | ORAL_TABLET | Freq: Every day | ORAL | Status: DC
  Administered 2021-06-17 – 2021-06-19 (×3): 75 mg via ORAL
  Filled 2021-06-16 (×3): qty 1

## 2021-06-16 MED ORDER — ENOXAPARIN SODIUM 40 MG/0.4ML IJ SOSY
PREFILLED_SYRINGE | INTRAMUSCULAR | Status: AC
  Filled 2021-06-16: qty 0.4

## 2021-06-16 MED ORDER — ASPIRIN EC 81 MG PO TBEC
DELAYED_RELEASE_TABLET | ORAL | Status: AC
  Filled 2021-06-16: qty 1

## 2021-06-16 MED ORDER — TRAMADOL HCL 50 MG PO TABS: 50.0000 mg | ORAL_TABLET | Freq: Four times a day (QID) | ORAL | Status: DC | PRN

## 2021-06-16 MED ORDER — HYDRALAZINE HCL 20 MG/ML IJ SOLN: 5.0000 mg | Freq: Four times a day (QID) | INTRAMUSCULAR | Status: DC | PRN

## 2021-06-16 MED ORDER — SENNOSIDES-DOCUSATE SODIUM 8.6-50 MG PO TABS: 1.0000 | ORAL_TABLET | Freq: Every evening | ORAL | Status: DC | PRN

## 2021-06-16 MED ORDER — ASPIRIN EC 81 MG PO TBEC
81.0000 mg | DELAYED_RELEASE_TABLET | Freq: Every day | ORAL | Status: DC
  Administered 2021-06-16: 81 mg via ORAL

## 2021-06-16 MED ORDER — STROKE: EARLY STAGES OF RECOVERY BOOK
Status: AC
  Filled 2021-06-16: qty 1

## 2021-06-16 MED ORDER — ACETAMINOPHEN 500 MG PO TABS
1000.0000 mg | ORAL_TABLET | Freq: Once | ORAL | Status: AC
  Administered 2021-06-16: 1000 mg via ORAL

## 2021-06-16 MED ORDER — ALPRAZOLAM 0.5 MG PO TABS
1.0000 mg | ORAL_TABLET | Freq: Every evening | ORAL | Status: DC | PRN
Start: 2021-06-16 — End: 2021-06-19
  Administered 2021-06-16: 2 mg via ORAL
  Administered 2021-06-17 – 2021-06-19 (×4): 1 mg via ORAL
  Filled 2021-06-16: qty 2
  Filled 2021-06-16: qty 4
  Filled 2021-06-16 (×3): qty 2

## 2021-06-16 MED ORDER — ACETAMINOPHEN 325 MG PO TABS: 650.0000 mg | ORAL_TABLET | ORAL | Status: DC | PRN

## 2021-06-16 MED ORDER — ENOXAPARIN SODIUM 40 MG/0.4ML IJ SOSY
40.0000 mg | PREFILLED_SYRINGE | INTRAMUSCULAR | Status: DC
  Administered 2021-06-16 – 2021-06-18 (×3): 40 mg via SUBCUTANEOUS
  Filled 2021-06-16 (×2): qty 0.4

## 2021-06-16 MED ORDER — ACETAMINOPHEN 160 MG/5ML PO SOLN: 650.0000 mg | ORAL | Status: DC | PRN

## 2021-06-16 NOTE — ED Notes (Signed)
Went in to check on patient and he was anxious, wanted to go home. Notified the md and he went in with me to talk to patient. Gave him something for anxiety and now he is agreeable to stay

## 2021-06-16 NOTE — ED Notes (Signed)
Filled out on wrong patient.

## 2021-06-16 NOTE — ED Notes (Signed)
Attempted to call report to [facility]. Staff states they will call back when RN is available.

## 2021-06-16 NOTE — ED Notes (Signed)
[facility] at bedside 

## 2021-06-16 NOTE — Plan of Care (Signed)

## 2021-06-16 NOTE — H&P (Addendum)
History and Physical    Charles Hale [id number] DOB: Jan 11, 1941 DOA: 06/15/2021  PCP: Egbert Garibaldi, PA-C Consultants:   Patient coming from:  [facility]. Lives at home alone   Chief Complaint: left sided weakness   HPI: Charles Hale is a 80 y.o. male with medical history significant of HTN, OA, depression and insomnia and ? Spinal stenosis that presented  to ED for left sided weakness. On Saturday night he got up to go the bathroom and reached for the door jam and he missed and fell onto his left hip. He got back up and was able to go back to bed.  Sunday morning he could not stand up and states his balance was so bad and felt like his left leg was very weak. He had to crawl because he couldn't walk initially and then was able to pull himself up and call his son. He states his left hand also feels weak. He denies any facial droop, slurred speech, confusion or dizziness.  Sensation remains intact.   He denies any fever, chills, vision changes, chest pain, or palpitations, shortness of breath or cough, abdominal pain, N/V/D, leg swelling, dysuria.   He has no known history of HLD or diabetes. He has never smoked. Has a past history of drinking, but stopped a few years ago.   ED Course: vitals: afebrile, bp: 134/72, HR: 64, RR: 18, oxygen: 99% room air  Pertinent labs: none Hip xray: no acute fracture CT head: no acute pathology. Dilation of lateral ventricles which appears out of proportion to degree of parenchymal loss.  CTA head/neck: Focal severe stenosis/occlusion of the left A2 segment with distal reconstitution. moderate stenosis of the proximal right P2 segment and severe stenosis of the left P1/P2 junction. No other high-grade stenosis of the intracranial vasculature Right carotid stenosis: 50-60% Left carotid stenosis: 40-50% 56mm pulmonary nodules in lung apices TRH was asked to admit and to let neurology know when patient arrived to [facility]    Review of Systems: As per HPI;  otherwise review of systems reviewed and negative.   Ambulatory Status:  Ambulates without assistance   Past Medical History:  Diagnosis Date   Anxiety    Arthritis    Hypertension    Spinal stenosis     Past Surgical History:  Procedure Laterality Date   SHOULDER ARTHROSCOPY      Social History   Socioeconomic History   Marital status: Widowed    Spouse name: Not on file   Number of children: Not on file   Years of education: Not on file   Highest education level: Not on file  Occupational History   Not on file  Tobacco Use   Smoking status: Never    Passive exposure: Never   Smokeless tobacco: Never  Substance and Sexual Activity   Alcohol use: Yes    Comment: occ   Drug use: Never   Sexual activity: Not on file  Other Topics Concern   Not on file  Social History Narrative   Not on file   Social Determinants of Health   Financial Resource Strain: Not on file  Food Insecurity: Not on file  Transportation Needs: Not on file  Physical Activity: Not on file  Stress: Not on file  Social Connections: Not on file  Intimate Partner Violence: Not on file    Allergies  Allergen Reactions   Codeine    Penicillins     History reviewed. No pertinent family history.  Prior to Admission  medications   Not on File    Physical Exam: Vitals:   06/16/21 1100 06/16/21 1200 06/16/21 1500 06/16/21 1702  BP: (!) 157/80 (!) 164/74 (!) 157/99 (!) 147/90  Pulse: 64 77 75 74  Resp:  17 16 18   Temp:    98.4 F (36.9 C)  TempSrc:    Oral  SpO2: 99% 98% 99% 100%  Weight:      Height:         General:  Appears calm and comfortable and is in NAD Eyes:  PERRL, EOMI, normal lids, iris ENT:  grossly normal hearing, lips & tongue, mmm; appropriate dentition Neck:  no LAD, masses or thyromegaly; no carotid bruits Cardiovascular:  RRR, no m/r/g. No LE edema.  Respiratory:   CTA bilaterally with no wheezes/rales/rhonchi.  Normal respiratory effort. Abdomen:  soft, NT,  ND, NABS Back:   normal alignment, no CVAT Skin:  no rash or induration seen on limited exam. Bruise over right lateral back, T4-5.  Musculoskeletal:  RLE: 5/5. LLE: 3/5. Can raise leg off bed, but no strength to resistance. RUE: 5/5, LUE: 4/5. Hand grip very mildly decreased on the left.  good ROM, no bony abnormality Lower extremity:  No LE edema.  Limited foot exam with no ulcerations.  2+ distal pulses. Psychiatric:  grossly normal mood and affect, speech fluent and appropriate, AOx3 Neurologic:  CN 2-12 grossly intact, moves all extremities in coordinated fashion, sensation intact. DTR 2+, heel to shin intact bilaterally. Finger to nose intact bilaterally. Negative pronator drift. Gait deferred.     Radiological Exams on Admission: Independently reviewed - see discussion in A/P where applicable  CT ANGIO HEAD NECK W WO CM  Result Date: 06/15/2021 CLINICAL DATA:  Stroke/TIA, fall yesterday with tingling and numbness in the legs EXAM: CT ANGIOGRAPHY HEAD AND NECK TECHNIQUE: Multidetector CT imaging of the head and neck was performed using the standard protocol during bolus administration of intravenous contrast. Multiplanar CT image reconstructions and MIPs were obtained to evaluate the vascular anatomy. Carotid stenosis measurements (when applicable) are obtained utilizing NASCET criteria, using the distal internal carotid diameter as the denominator. CONTRAST:  23mL OMNIPAQUE IOHEXOL 350 MG/ML SOLN COMPARISON:  None. FINDINGS: CT HEAD FINDINGS Brain: There is no evidence of acute intracranial hemorrhage, extra-axial fluid collection, or acute infarct. There is mild global parenchymal volume loss. The lateral ventricles are dilated which appears out of proportion to the degree of parenchymal volume loss, and there is crowding of the gyri at the vertex. Patchy hypodensity in the subcortical and periventricular white matter is nonspecific but likely reflects sequela of chronic white matter  microangiopathy. There is no solid mass lesion.  There is no midline shift. Vascular: There is calcification of the bilateral cavernous ICAs. Skull: Normal. Negative for fracture or focal lesion. Sinuses and orbits: The paranasal sinuses are clear. Bilateral lens implants are in place. The globes and orbits are otherwise unremarkable. Review of the MIP images confirms the above findings CTA NECK FINDINGS Aortic arch: There is mild calcified atherosclerotic plaque of the aortic arch. The origins of the major branch vessels are patent. There is no evidence of aneurysm or dissection to the level imaged. Right carotid system: There is soft and calcified atherosclerotic plaque in the proximal right internal carotid artery resulting in up to approximately 50-60% stenosis. There is severe stenosis at the origin of the right external carotid artery (13-77). The high cervical internal carotid artery is tortuous. There is no evidence of dissection or aneurysm.  Left carotid system: There is calcified atherosclerotic plaque in the proximal left internal carotid artery resulting in up to approximately 40-50% stenosis. The left external carotid artery is patent. There is no evidence of dissection or aneurysm. The high cervical left internal carotid artery is tortuous. Vertebral arteries: The vertebral arteries are patent, without hemodynamically significant stenosis, occlusion, dissection, or aneurysm. Skeleton: There is advanced multilevel degenerative change throughout the cervical spine, most severe at C5-C6. There is degenerative pannus about the dens resulting in mild narrowing of the craniocervical junction. There is no visible canal hematoma. There is no acute osseous abnormality or aggressive osseous lesion. Other neck: The soft tissues are unremarkable. Upper chest: There is biapical scarring. There are a few small nodules measuring up to 4 mm. Review of the MIP images confirms the above findings CTA HEAD FINDINGS  Anterior circulation: There is calcified atherosclerotic plaque in the bilateral intracranial ICAs without hemodynamically significant stenosis or occlusion. The bilateral MCAs are patent. There is focal high-grade stenosis/occlusion of the left A2 segment with distal reconstitution (11-282). The bilateral ACAs are otherwise patent. There is no aneurysm. Posterior circulation: There is a PICA termination of the right vertebral artery, a normal variant. There is mild calcified atherosclerotic plaque in the left V4 segment resulting in mild stenosis. The basilar artery is patent. There is focal moderate stenosis of the proximal right P2 segment (11-252). There is mild multifocal irregularity of the distal right PCA. There is focal severe stenosis at the left P1/P2 junction (11-247). The left PCA is otherwise patent with mild multifocal irregularity of the distal branches. There is no aneurysm. Venous sinuses: Patent. Anatomic variants: As above. Review of the MIP images confirms the above findings IMPRESSION: CT head: 1. No acute intracranial pathology. 2. Dilation of the lateral ventricles which appears out of proportion to degree of parenchymal volume loss. This may reflect central greater than peripheral parenchymal volume loss; however, normal pressure hydrocephalus should be considered. 3. Mild chronic white matter microangiopathy. CTA head and neck: 1. Focal severe stenosis/occlusion of the left A2 segment with distal reconstitution. 2. Additional intracranial atherosclerotic disease as detailed above resulting in up to moderate stenosis of the proximal right P2 segment and severe stenosis of the left P1/P2 junction. No other high-grade stenosis of the intracranial vasculature. 3. Calcified atherosclerotic plaque in the bilateral carotid bulbs resulting in up to approximately 50-60% stenosis on the right and 40-50% stenosis on the left. 4. Stenosis at the origin of the right external carotid artery. 5. Advanced  multilevel degenerative changes in the cervical spine, most severe at C5-C6. 6. Sub 6 mm pulmonary nodules in the lung apices. No follow-up needed if patient is low-risk. Non-contrast chest CT can be considered in 12 months if patient is high-risk. This recommendation follows the consensus statement: Guidelines for Management of Incidental Pulmonary Nodules Detected on CT Images: From the Fleischner Society 2017; Radiology 2017; 284:228-243. Electronically Signed   By: Valetta Mole M.D.   On: 06/15/2021 21:12   DG Hip Unilat With Pelvis 2-3 Views Left  Result Date: 06/15/2021 CLINICAL DATA:  Fall.  LEFT hip pain EXAM: DG HIP (WITH OR WITHOUT PELVIS) 2-3V LEFT COMPARISON:  None. FINDINGS: Hips are located. No evidence of pelvic fracture or sacral fracture. Dedicated view of the LEFT hip demonstrates no femoral neck fracture. IMPRESSION: No pelvic fracture or hip fracture Electronically Signed   By: Suzy Bouchard M.D.   On: 06/15/2021 14:22    EKG: Independently reviewed.  NSR with rate  71; nonspecific ST changes with no evidence of acute ischemia. No previous ekg.    Labs on Admission: I have personally reviewed the available labs and imaging studies at the time of the admission.  Pertinent labs:  None     Assessment/Plan Principal Problem:   Acute left-sided weakness 80 year old male presenting with acute left sided weakness and concerns for stroke vs. TIA. Risks factors include age and HTN. CT head negative for acute finging, but does focal severe stenosis/occlusion of the left A2 segment with distal reconstitution. moderate stenosis of the proximal right P2 segment and severe stenosis of the left P1/P2 junction -admitted to telemetry for TIA/stroke work-up -Neurochecks per protocol -Neurology consulted -MRI brain without contrast ordered as well as echo -start ASA 81mg  daily, f/u on neurology recommendations  -Permissive hypertension first 24 hours <220/110; however, he is about 2 days  out from symptoms onset and can likely start meds back tomorrow.  -passed bedside swallow screen  -PT/ OT consult    Active Problems:   Essential hypertension Allow for permissive HTN in setting of possible CVA. He is 2 days out from symptom onset, could start meds tomorrow.  Prn hydralazine with parameters if needed.     Depression Continue zoloft     Osteoarthritis Hold mobic. Tylenol and tramadol prn.    Insomnia  Has been on xanax for over 15 years, discussed this is not a drug for insomnia, but with longevity of use will be hard to wean him off Continue while inpatient.   Hx of spinal stenosis Hx of trauma to his back and he recalls being told he had spinal stenosis.  If MRI head negative, would MRI his lumbar spine to see if contributing to his left leg weakness.   2mm pulmonary nodule F/u outpatient. If low risk, no further screening recommended.   Body mass index is 24.01 kg/m.   Level of care: Telemetry Medical DVT prophylaxis:  Lovenox  Code Status:  Full - confirmed with patient Family Communication: daughter at bedside: denise Edison  Disposition Plan:  The patient is from: home  Anticipated d/c is to: home   Requires inpatient hospitalization and is at significant risk of neurological worsening, requires constant monitoring, assessment and MDM with specialists.    Patient is currently: stable  Consults called: neurology  Admission status:  inpatient    11m MD [facility]   How to contact the [facility] Attending or Consulting provider 7A - 7P or covering provider during after hours 7P -7A, for this patient?  Check the care team in [facility] and look for a) attending/consulting [facility] provider listed and b) the [facility] team listed Log into [email or web address] and use [place]'s universal password to access. If you do not have the password, please contact the hospital operator. Locate the [facility] provider you are looking for under [facility] and page to a  number that you can be directly reached. If you still have difficulty reaching the provider, please page the [facility] (Director on Call) for the Hospitalists listed on amion for assistance.   06/16/2021, 6:36 PM

## 2021-06-16 NOTE — ED Notes (Signed)
Medical necessity was filled out on wrong patient, please ignore previous entry.

## 2021-06-16 NOTE — Progress Notes (Signed)
MD performed bedside swallow eval, patient passed. Awaiting diet orders.

## 2021-06-16 NOTE — Consult Note (Addendum)
Neurology Consultation  Reason for Consult: Left-sided weakness Referring Physician: Dr. Orma Flaming, hospitalist/Dr. Nanda Quinton, EDP  CC: Left-sided weakness  History is obtained from: Patient, chart, patient's daughter-in-law  HPI: Charles Hale is a 80 y.o. male past medical history of hypertension, spinal stenosis, arthritis, presenting to the emergency room med center with can signs of a fall and left leg weakness.  Patient says that he was in his usual state of health somewhere on Saturday night 06/13/2021 and on the next morning he was having some difficulty more so with the left side and had a fall.  He says that he has been having trouble with his legs due to his arthritis for the past couple of weeks on his elliptical but this was very unilateral.  He said his hand grip on the left feels weak as well as left leg feels weak.  No tingling numbness or sensory issues.  He did not notice any slurred speech.  He did not notice any facial asymmetry. He lives by himself.  Family lives in [place].  Daughter-in-law was at bedside when he was in the MRI.  I spoke with her.  She corroborated the history above. No current headache chest pain nausea vomiting shortness of breath.  No preceding illnesses sicknesses fevers chills.  No abdominal pain nausea vomiting.  No bleeding or clotting disorders. Denies memory issues.  Denies any urinary incontinence.  Gait-has been somewhat worsening over the past few weeks but reports it being secondary to his arthritis.   LKW: Sometime on 06/14/2019 tpa given?: no, so the window Premorbid modified Rankin scale (mRS): 1-primarily due to arthritis  ROS: Full ROS was performed and is negative except as noted in the HPI.  Past Medical History:  Diagnosis Date   Anxiety    Arthritis    Hypertension    Spinal stenosis    History reviewed. No pertinent family history. Social History:   reports that he has never smoked. He has never been exposed to tobacco  smoke. He has never used smokeless tobacco. He reports current alcohol use. He reports that he does not use drugs.  Medications  Current Facility-Administered Medications:     stroke: mapping our early stages of recovery book, , Does not apply, Once, Orma Flaming, MD   acetaminophen (TYLENOL) tablet 650 mg, 650 mg, Oral, Q4H PRN **OR** acetaminophen (TYLENOL) 160 MG/5ML solution 650 mg, 650 mg, Per Tube, Q4H PRN **OR** acetaminophen (TYLENOL) suppository 650 mg, 650 mg, Rectal, Q4H PRN, Orma Flaming, MD   ALPRAZolam Duanne Moron) tablet 1-2 mg, 1-2 mg, Oral, QHS PRN,MR X 1, Orma Flaming, MD   aspirin EC tablet 81 mg, 81 mg, Oral, Daily, Orma Flaming, MD   enoxaparin (LOVENOX) injection 40 mg, 40 mg, Subcutaneous, Q24H, Orma Flaming, MD   hydrALAZINE (APRESOLINE) injection 5 mg, 5 mg, Intravenous, Q6H PRN, Orma Flaming, MD   senna-docusate (Senokot-S) tablet 1 tablet, 1 tablet, Oral, QHS PRN, Orma Flaming, MD   sertraline (ZOLOFT) tablet 100 mg, 100 mg, Oral, QHS, Orma Flaming, MD   traMADol Veatrice Bourbon) tablet 50 mg, 50 mg, Oral, Q6H PRN, Orma Flaming, MD   Exam: Current vital signs: BP (!) 147/90 (BP Location: Left Arm)   Pulse 74   Temp 98.4 F (36.9 C) (Oral)   Resp 18   Ht 6\' 1"  (1.854 m)   Wt 82.6 kg   SpO2 100%   BMI 24.01 kg/m  Vital signs in last 24 hours: Temp:  [98.2 F (36.8 C)-98.4 F (36.9  C)] 98.4 F (36.9 C) (11/29 1702) Pulse Rate:  [59-84] 74 (11/29 1702) Resp:  [10-23] 18 (11/29 1702) BP: (132-169)/(72-130) 147/90 (11/29 1702) SpO2:  [96 %-100 %] 100 % (11/29 1702) General: Awake alert in no distress HEENT: Normocephalic/atraumatic Lungs: Clear next cardiovascular: Regular rate rhythm Abdomen nondistended nontender soft with normoactive bowel sounds Extremities warm well perfused with intact pulses Neurological exam Mental status: Awake alert oriented x3 Speech and language: No evidence of aphasia.  No evidence of dysarthria. Cranial nerves II to  XII: Grossly intact with question subtle left lower facial weakness but symmetric smile. Motor examination with mild drift in the left upper extremity with weak left grip strength compared to the right.  Noticeable drift in the left lower extremity.  Normal right upper and lower extremity strength. Sensation intact to light touch all over without extinction No dysmetria NIH stroke scale 1a Level of Conscious.: 0 1b LOC Questions: 0 1c LOC Commands: 0 2 Best Gaze: 0 3 Visual: 0 4 Facial Palsy: 1 5a Motor Arm - left: 1 5b Motor Arm - Right: 0 6a Motor Leg - Left: 1 6b Motor Leg - Right: 0 7 Limb Ataxia: 0 8 Sensory: 00 9 Best Language: 0 10 Dysarthria: 0 11 Extinct. and Inatten.: 0 TOTAL:3    Labs I have reviewed labs in epic and the results pertinent to this consultation are:  CBC    Component Value Date/Time   WBC 4.3 06/16/2021 1715   RBC 4.38 06/16/2021 1715   HGB 14.6 06/16/2021 1715   HCT 42.9 06/16/2021 1715   PLT 173 06/16/2021 1715   MCV 97.9 06/16/2021 1715   MCH 33.3 06/16/2021 1715   MCHC 34.0 06/16/2021 1715   RDW 11.6 06/16/2021 1715   LYMPHSABS 0.4 (L) 06/16/2021 1715   MONOABS 0.3 06/16/2021 1715   EOSABS 0.0 06/16/2021 1715   BASOSABS 0.0 06/16/2021 1715    CMP     Component Value Date/Time   NA 137 06/15/2021 1902   K 4.2 06/15/2021 1902   CL 101 06/15/2021 1902   CO2 27 06/15/2021 1902   GLUCOSE 119 (H) 06/15/2021 1902   BUN 19 06/15/2021 1902   CREATININE 0.81 06/15/2021 1902   CALCIUM 9.5 06/15/2021 1902   PROT 7.4 06/15/2021 1902   ALBUMIN 4.5 06/15/2021 1902   AST 34 06/15/2021 1902   ALT 34 06/15/2021 1902   ALKPHOS 87 06/15/2021 1902   BILITOT 0.4 06/15/2021 1902   GFRNONAA >60 06/15/2021 1902   Imaging I have reviewed the images obtained:  CT head:  1. No acute intracranial pathology. 2. Dilation of the lateral ventricles which appears out of proportion to degree of parenchymal volume loss. This may reflect central greater  than peripheral parenchymal volume loss; however, normal pressure hydrocephalus should be considered. 3. Mild chronic white matter microangiopathy.   CTA head and neck:  1. Focal severe stenosis/occlusion of the left A2 segment with distal reconstitution. 2. Additional intracranial atherosclerotic disease as detailed above resulting in up to moderate stenosis of the proximal right P2 segment and severe stenosis of the left P1/P2 junction. No other high-grade stenosis of the intracranial vasculature. 3. Calcified atherosclerotic plaque in the bilateral carotid bulbs resulting in up to approximately 50-60% stenosis on the right and 40-50% stenosis on the left. 4. Stenosis at the origin of the right external carotid artery. 5. Advanced multilevel degenerative changes in the cervical spine, most severe at C5-C6. 6. Sub 6 mm pulmonary nodules in the lung apices. No  follow-up needed if patient is low-risk. Non-contrast chest CT can be considered in 12 months if patient is high-risk. This recommendation follows the consensus statement: Guidelines for Management of Incidental Pulmonary Nodules Detected on CT Images: From the Fleischner Society 2017; Radiology 2017; 284:228-243.  MRI BRAIN IMPRESSION: 1. Small focus of acute ischemia within the ventral right medulla oblongata. No hemorrhage or mass effect. 2. Advanced atrophy and chronic ischemic microangiopathy.  Assessment: 80 year old with above past medical history with sudden onset of left-sided weakness leading to fall.  On examination has mild left hemiparesis.  CT angiography head and neck with multifocal intracranial atherosclerosis as well as cervical carotid bulb plaque resulting in 50 to 60% stenosis on the right and 40 to 50% stenosis on the left.  Multiple intracranial atherosclerosis in the PCA territory. MRI of the brain with a small ventral right medullary acute/subacute stroke-likely the culprit for his  presentation.  Impression: - Acute ischemic stroke involving the right medulla oblongata-likely secondary to small vessel disease versus from the multiple intracranial atherosclerotic lesions. -CT head with concern for disproportionate dilatation of the ventricles.  Clinically, other than gait disturbance which he attributes to arthritis, no evidence of urinary incontinence or memory issues to make NPH as high differential but in the future, if he has gait issues, memory issues or urinary incontinence with it-outpatient neurological and neurosurgical work-up should be pursued.  Recommendations: Admit to hospitalist Frequent neurochecks Telemetry 2D echo A1c Lipid panel Aspirin 325+ Plavix 75+ high intensity statin for 3 months followed by single antiplatelet and statin only. PT OT Speech therapy No need for permissive hypertension-he is already day 3 from his last known well and at this point his blood pressures should be normalized. Stroke team to follow  -- Milon Dikes, MD Neurologist [facility] Pager: [phone]

## 2021-06-17 ENCOUNTER — Inpatient Hospital Stay (HOSPITAL_COMMUNITY): Payer: PPO

## 2021-06-17 DIAGNOSIS — I6389 Other cerebral infarction: Secondary | ICD-10-CM | POA: Diagnosis not present

## 2021-06-17 DIAGNOSIS — I6330 Cerebral infarction due to thrombosis of unspecified cerebral artery: Secondary | ICD-10-CM

## 2021-06-17 DIAGNOSIS — I10 Essential (primary) hypertension: Secondary | ICD-10-CM

## 2021-06-17 LAB — LIPID PANEL
Cholesterol: 176 mg/dL (ref 0–200)
HDL: 59 mg/dL (ref >40)
LDL Cholesterol: 96 mg/dL (ref 0–99)
Total CHOL/HDL Ratio: 3 RATIO
Triglycerides: 107 mg/dL (ref <150)
VLDL: 21 mg/dL (ref 0–40)

## 2021-06-17 LAB — HEMOGLOBIN A1C
Hgb A1c MFr Bld: 5.5 % (ref 4.8–5.6)
Mean Plasma Glucose: 111 mg/dL

## 2021-06-17 LAB — ECHOCARDIOGRAM COMPLETE
Area-P 1/2: 2.6 cm2
Height: 73 in
S' Lateral: 3.6 cm
Weight: 2912 oz

## 2021-06-17 MED ORDER — AMLODIPINE BESYLATE 10 MG PO TABS
10.0000 mg | ORAL_TABLET | Freq: Every day | ORAL | Status: DC
  Administered 2021-06-17 – 2021-06-19 (×3): 10 mg via ORAL
  Filled 2021-06-17 (×3): qty 1

## 2021-06-17 MED ORDER — ASPIRIN EC 81 MG PO TBEC
81.0000 mg | DELAYED_RELEASE_TABLET | Freq: Every day | ORAL | Status: DC
  Administered 2021-06-17 – 2021-06-19 (×3): 81 mg via ORAL
  Filled 2021-06-17 (×4): qty 1

## 2021-06-17 MED ORDER — ATORVASTATIN CALCIUM 40 MG PO TABS
40.0000 mg | ORAL_TABLET | Freq: Every day | ORAL | Status: DC
  Administered 2021-06-17 – 2021-06-19 (×3): 40 mg via ORAL
  Filled 2021-06-17 (×3): qty 1

## 2021-06-17 NOTE — Progress Notes (Addendum)
STROKE TEAM PROGRESS NOTE   ATTENDING NOTE: I reviewed above note and agree with the assessment and plan. Pt was seen and examined.   80 year old male with history of hypertension, spinal stenosis, admitted for left-sided weakness and a fall at home.  MRI showed ventral right medullary infarct.  CTA head and neck left V4 atherosclerosis, right VA ends up as PICA, bilateral PCA P1/P2 stenosis, left A2 stenosis, right ICA 50 to 60% and left ICA 40 to 50% stenosis.  LDL 96, UDS negative, A1c pending, creatinine 0.81, EF 60 to 65%.  On exam, patient awake alert, orientated x3, no aphasia, follows simple commands, no dysarthria.  No gaze palsy, no diplopia, visual fields full, facial symmetrical.  Left upper extremity and lower extremity 4+/5, left ankle DF 3-/5 and PF 4/5.  Sensation symmetrical, left FTN mild dysmetria not out of proportion to weakness.  Gait not tested.  Etiology for patient stroke likely due to small vessel disease in the setting of diffuse intracranial stenosis.  Recommend aspirin 81 and Plavix 75 DAPT for 3 weeks and then aspirin alone.  Add Lipitor 40.  PT OT recommend CIR.  Progressive risk factor modification.  For detailed assessment and plan, please refer to above as I have made changes wherever appropriate.   Neurology will sign off. Please call with questions. Pt will follow up with stroke clinic NP at [facility] in about 4 weeks. Thanks for the consult.   Marvel Plan, MD PhD Stroke Neurology 06/17/2021 7:40 PM    INTERVAL HISTORY Patient is seen in his room with no family at the bedside.  Yesterday, he experienced a fall and left-sided weakness.  He was admitted and found to have a right medullary stroke. He was not offered TNK as he presented outside of the window (LKN 3 days ago). He has been hemodynamically stable and his neurological exam has been stable.  Vitals:   06/16/21 2350 06/17/21 0417 06/17/21 0812 06/17/21 1318  BP: (!) 158/74 139/82 117/80 (!) 145/76   Pulse: 68 60 73 69  Resp: 16 14  16   Temp: 98.6 F (37 C) (!) 97.5 F (36.4 C) 97.9 F (36.6 C) 98.2 F (36.8 C)  TempSrc: Oral Oral Oral Oral  SpO2: 97% 97%  99%  Weight:      Height:       CBC:  Recent Labs  Lab 06/15/21 1902 06/16/21 1715  WBC 4.4 4.3  NEUTROABS 3.7 3.6  HGB 14.4 14.6  HCT 41.7 42.9  MCV 98.8 97.9  PLT 161 173   Basic Metabolic Panel:  Recent Labs  Lab 06/15/21 1902  NA 137  K 4.2  CL 101  CO2 27  GLUCOSE 119*  BUN 19  CREATININE 0.81  CALCIUM 9.5   Lipid Panel:  Recent Labs  Lab 06/17/21 0405  CHOL 176  TRIG 107  HDL 59  CHOLHDL 3.0  VLDL 21  LDLCALC 96   HgbA1c: No results for input(s): HGBA1C in the last 168 hours. Urine Drug Screen:  Recent Labs  Lab 06/15/21 1902  LABOPIA NONE DETECTED  COCAINSCRNUR NONE DETECTED  LABBENZ POSITIVE*  AMPHETMU NONE DETECTED  THCU NONE DETECTED  LABBARB NONE DETECTED    Alcohol Level  Recent Labs  Lab 06/15/21 1902  ETH <10    IMAGING past 24 hours MR BRAIN WO CONTRAST  Result Date: 06/16/2021 CLINICAL DATA:  Acute neurologic deficit EXAM: MRI HEAD WITHOUT CONTRAST TECHNIQUE: Multiplanar, multiecho pulse sequences of the brain and surrounding structures were obtained  without intravenous contrast. COMPARISON:  None. FINDINGS: Brain: Small focus of abnormal diffusion restriction within the ventral right medulla oblongata. No other diffusion abnormality. No acute or chronic hemorrhage. There is multifocal hyperintense T2-weighted signal within the white matter. Advanced atrophy for age. The midline structures are normal. Vascular: Major flow voids are preserved. Skull and upper cervical spine: Normal calvarium and skull base. Visualized upper cervical spine and soft tissues are normal. Sinuses/Orbits:No paranasal sinus fluid levels or advanced mucosal thickening. No mastoid or middle ear effusion. Normal orbits. IMPRESSION: 1. Small focus of acute ischemia within the ventral right medulla  oblongata. No hemorrhage or mass effect. 2. Advanced atrophy and chronic ischemic microangiopathy. Electronically Signed   By: Deatra Robinson M.D.   On: 06/16/2021 20:43   ECHOCARDIOGRAM COMPLETE  Result Date: 06/17/2021    ECHOCARDIOGRAM REPORT   Patient Name:   CHADDRICK BRUE Date of Exam: 06/17/2021 Medical Rec #:  [id number]       Height:       73.0 in Accession #:    [id number]      Weight:       182.0 lb Date of Birth:  1941/05/30      BSA:          2.067 m Patient Age:    78 years        BP:           117/80 mmHg Patient Gender: M               HR:           69 bpm. Exam Location:  Inpatient Procedure: 2D Echo, Color Doppler and Cardiac Doppler Indications:    stroke  History:        Patient has no prior history of Echocardiogram examinations.                 Risk Factors:Hypertension.  Sonographer:    Melissa Morford RDCS (AE, PE) Referring Phys: [id number] ALLISON WOLFE IMPRESSIONS  1. Left ventricular ejection fraction, by estimation, is 60 to 65%. Left ventricular ejection fraction by 3D volume is 66 %. The left ventricle has normal function. The left ventricle has no regional wall motion abnormalities. Left ventricular diastolic  parameters are consistent with Grade I diastolic dysfunction (impaired relaxation).  2. Right ventricular systolic function is normal. The right ventricular size is normal. There is normal pulmonary artery systolic pressure.  3. There is mild posterior annular calcification. No evidence of mitral valve regurgitation. No evidence of mitral stenosis.  4. The aortic valve is normal in structure. Aortic valve regurgitation is not visualized. Aortic valve sclerosis is present, with no evidence of aortic valve stenosis.  5. The inferior vena cava is normal in size with greater than 50% respiratory variability, suggesting right atrial pressure of 3 mmHg.  6. Tricuspid valve regurgitation is mild to moderate. FINDINGS  Left Ventricle: Left ventricular ejection fraction, by estimation,  is 60 to 65%. Left ventricular ejection fraction by 3D volume is 66 %. The left ventricle has normal function. The left ventricle has no regional wall motion abnormalities. The left ventricular internal cavity size was normal in size. There is no left ventricular hypertrophy. Left ventricular diastolic parameters are consistent with Grade I diastolic dysfunction (impaired relaxation). Right Ventricle: The right ventricular size is normal. No increase in right ventricular wall thickness. Right ventricular systolic function is normal. There is normal pulmonary artery systolic pressure. The tricuspid regurgitant velocity is 2.38 m/s, and  with  an assumed right atrial pressure of 3 mmHg, the estimated right ventricular systolic pressure is 25.7 mmHg. Left Atrium: Left atrial size was normal in size. Right Atrium: Right atrial size was normal in size. Pericardium: There is no evidence of pericardial effusion. Mitral Valve: The mitral valve is abnormal. Mild mitral annular calcification. Trivial mitral valve regurgitation. No evidence of mitral valve stenosis. Tricuspid Valve: The tricuspid valve is normal in structure. Tricuspid valve regurgitation is mild to moderate. No evidence of tricuspid stenosis. Aortic Valve: The aortic valve is normal in structure. Aortic valve regurgitation is not visualized. Aortic valve sclerosis is present, with no evidence of aortic valve stenosis. Pulmonic Valve: The pulmonic valve was not well visualized. Pulmonic valve regurgitation is not visualized. No evidence of pulmonic stenosis. Aorta: The aortic root is normal in size and structure. Venous: The inferior vena cava is normal in size with greater than 50% respiratory variability, suggesting right atrial pressure of 3 mmHg. IAS/Shunts: No atrial level shunt detected by color flow Doppler.  LEFT VENTRICLE PLAX 2D LVIDd:         4.60 cm         Diastology LVIDs:         3.60 cm         LV e' medial:    9.90 cm/s LV PW:         1.00 cm          LV E/e' medial:  6.2 LV IVS:        0.90 cm         LV e' lateral:   12.00 cm/s LVOT diam:     2.20 cm         LV E/e' lateral: 5.1 LV SV:         66 LV SV Index:   32 LVOT Area:     3.80 cm        3D Volume EF                                LV 3D EF:    Left                                             ventricul                                             ar                                             ejection                                             fraction                                             by 3D  volume is                                             66 %.                                 3D Volume EF:                                3D EF:        66 %                                LV EDV:       119 ml                                LV ESV:       41 ml                                LV SV:        78 ml RIGHT VENTRICLE RV S prime:     9.46 cm/s TAPSE (M-mode): 1.5 cm LEFT ATRIUM             Index        RIGHT ATRIUM           Index LA diam:        3.80 cm 1.84 cm/m   RA Area:     12.30 cm LA Vol (A2C):   34.8 ml 16.84 ml/m  RA Volume:   24.20 ml  11.71 ml/m LA Vol (A4C):   35.3 ml 17.08 ml/m LA Biplane Vol: 35.9 ml 17.37 ml/m  AORTIC VALVE LVOT Vmax:   95.80 cm/s LVOT Vmean:  63.200 cm/s LVOT VTI:    0.174 m  AORTA Ao Root diam: 3.10 cm MITRAL VALVE               TRICUSPID VALVE MV Area (PHT): 2.60 cm    TR Peak grad:   22.7 mmHg MV Decel Time: 292 msec    TR Vmax:        238.00 cm/s MV E velocity: 60.90 cm/s MV A velocity: 78.50 cm/s  SHUNTS MV E/A ratio:  0.78        Systemic VTI:  0.17 m                            Systemic Diam: 2.20 cm Kardie Tobb DO Electronically signed by Thomasene Ripple DO Signature Date/Time: 06/17/2021/1:34:34 PM    Final     PHYSICAL EXAM General: Alert, well-developed patient in no acute distress   NEURO:  Mental Status: AA&Ox3  Speech/Language: speech is without dysarthria or aphasia.  Naming, repetition, fluency,  and comprehension intact.  Cranial Nerves:  II: PERRL. Visual fields full.  III, IV, VI: EOMI. Eyelids elevate symmetrically.  V: Sensation is intact to light touch and symmetrical to face.  VII: Smile is symmetrical.  VIII: hearing intact to voice. IX, X:  Phonation is normal.  XII: tongue  is midline without fasciculations. Motor: 5/5 strength to RUE, RLE and LLE (with some weakness on dorsiflexion), 4+/5 in LUE  Sensation- Intact to light touch bilaterally. Extinction absent to light touch to DSS.  Coordination: FTN intact on right with ataxia on the left Gait- deferred   ASSESSMENT/PLAN Mr. LAVARR PRESIDENT is a 80 y.o. male with history of HTN, spinal stenosis and arthritis presenting with left sided weakness after a fall at home.  He was found to have a right medullary stroke with enlargement of the ventricles noted on imaging.  There was initially some concern for NPH, but patient has not had and memory issues, urinary incontinence or other symptoms typical of this condition.   Stroke:  right medullary infarct likely secondary secondary to small vessel disease secondary to atherosclerosis  CT head No acute abnormality.  Dilation of lateral ventricles Small vessel disease.  CTA head & neck Stenosis of left A2 segment, moderate stenosis of proximal P2 segment, 50-60% stenosis of right carotid bulb, 40-50% stenosis of left carotid bulb MRI  Small focus of acute ischemia in ventral right medulla oblongata, advanced atrophy and chronic microangiopathy 2D Echo EF 60-65%, grade 1 diastolic dysfunction, mild to moderate tricuspid valve regurgitation, aortic valve sclerosis without stenosis, no atrial level shunt LDL 96 HgbA1c No results found for requested labs within last [id number] hours. VTE prophylaxis - lovenox    Diet   Diet Heart Room service appropriate? Yes; Fluid consistency: Thin   No antithrombotic prior to admission, now on aspirin 81 mg daily and clopidogrel 75 mg daily.   Therapy recommendations:  CIR Disposition:  pending  Hypertension Home meds:  Norvasc 10 mg daily Stable Long-term BP goal normotensive  Hyperlipidemia Home meds:  none LDL 96, goal < 70 Add Atorvastatin 40 mg daily  High intensity statin initiated Continue statin at discharge   Other Stroke Risk Factors Advanced Age >/= 61   Other Active Problems Pulmonary nodules in lung apices Follow up chest CT in 12 months if patient is considered high risk Osteoarthritis PRN acetaminophen and tramodol Insomnia Continue home alprazolam   Hospital day # 1  Cortney E Ernestina Columbia , MSN, AGACNP-BC [facility] See Amion for schedule and pager information 06/17/2021 2:12 PM    To contact Stroke Continuity provider, please refer to [email or web address]. After hours, contact General Neurology

## 2021-06-17 NOTE — Progress Notes (Signed)
Inpatient Rehab Admissions Coordinator:   Per TOC Pt. Does not have 24/7 support at home and family prefers SNF. CIR will sign off.   Megan Salon, MS, CCC-SLP Rehab Admissions Coordinator  [phone] (celll) [phone] (office)

## 2021-06-17 NOTE — Evaluation (Signed)
Occupational Therapy Evaluation Patient Details Name: Charles Hale MRN: [id number] DOB: 04/26/1941 Today's Date: 06/17/2021   History of Present Illness Charles Hale is a 80 y.o. male with medical history significant of HTN, OA, depression and insomnia and ? Spinal stenosis that presented  to ED for left sided weakness. 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   Clinical Impression   Pt admitted for concern listed above. PTA pt reported that he was independent with all ADL's and IADL's. At this time, pt requiring mod-min A for sit<>stands due to poor balance, and min-max A for all ADL's due to weakness and balance concerns. Pt with poor awareness of deficits or safety, does not acknowledge that it is not safe that he falls back into the chair with every attempt to stand without assist. OT provided education on safety and deficits. Recommending Acute Inpatient Rehab to maximize pt's return to independence. OT will follow acutely to address concerns listed below.      Recommendations for follow up therapy are one component of a multi-disciplinary discharge planning process, led by the attending physician.  Recommendations may be updated based on patient status, additional functional criteria and insurance authorization.   Follow Up Recommendations  Acute inpatient rehab (3hours/day)    Assistance Recommended at Discharge Frequent or constant Supervision/Assistance  Functional Status Assessment  Patient has had a recent decline in their functional status and demonstrates the ability to make significant improvements in function in a reasonable and predictable amount of time.  Equipment Recommendations  Other (comment) (RW)    Recommendations for Other Services       Precautions / Restrictions Precautions Precautions: Fall Precaution Comments: L sided weakness Restrictions Weight Bearing Restrictions: No      Mobility Bed Mobility Overal bed mobility: Needs Assistance              General bed mobility comments: Pt up in recliner with PT on entry    Transfers Overall transfer level: Needs assistance Equipment used: Rolling walker (2 wheels) Transfers: Sit to/from Stand Sit to Stand: Min assist;Mod assist           General transfer comment: Pt initially requiring mod A to stand and steady, as pt quickly falls back down into chair on standing, due to retropulsing. Educated pt on nose over toes and pt is able to stand and steady with min A, practiced x5, including from toilet      Balance Overall balance assessment: Needs assistance Sitting-balance support: Feet supported;Single extremity supported Sitting balance-Leahy Scale: Fair Sitting balance - Comments: pt reliant on 1 UE to maintain EOB sitting   Standing balance support: Bilateral upper extremity supported Standing balance-Leahy Scale: Poor Standing balance comment: dependent on UEs/RW                           ADL either performed or assessed with clinical judgement   ADL Overall ADL's : Needs assistance/impaired Eating/Feeding: Set up;Sitting   Grooming: Minimal assistance;Standing   Upper Body Bathing: Minimal assistance;Sitting   Lower Body Bathing: Maximal assistance;Sitting/lateral leans;Sit to/from stand   Upper Body Dressing : Minimal assistance;Sitting   Lower Body Dressing: Maximal assistance;Sit to/from stand;Sitting/lateral leans   Toilet Transfer: Moderate assistance;Ambulation   Toileting- Clothing Manipulation and Hygiene: Moderate assistance;Maximal assistance;Sitting/lateral lean;Sit to/from stand       Functional mobility during ADLs: Minimal assistance;Moderate assistance;Rolling walker (2 wheels) General ADL Comments: Pt presenting with balance deficits, decreased awareness of safety, and  weakness, requiring min-max A     Vision Baseline Vision/History: 1 Wears glasses Ability to See in Adequate Light: 0 Adequate Patient Visual Report: No change  from baseline Vision Assessment?: No apparent visual deficits Additional Comments: Assessed peripherals, tracking, saccades, and convergence, no deficits noted     Perception     Praxis      Pertinent Vitals/Pain       Hand Dominance Right   Extremity/Trunk Assessment Upper Extremity Assessment Upper Extremity Assessment: LUE deficits/detail LUE Deficits / Details: LUE grossly weaker with decreased coordination compared to RUE. shoulder flex - 4-/5, elbow flex - 4/5, grip - 4/5 LUE Sensation: decreased light touch LUE Coordination: decreased fine motor   Lower Extremity Assessment Lower Extremity Assessment: Defer to PT evaluation LLE Deficits / Details: 2-/5 DF, 3-/5 quad and hip flexor LLE Coordination: decreased gross motor   Cervical / Trunk Assessment Cervical / Trunk Assessment: Normal   Communication Communication Communication: No difficulties   Cognition Arousal/Alertness: Awake/alert Behavior During Therapy: Impulsive Overall Cognitive Status: Impaired/Different from baseline Area of Impairment: Safety/judgement;Problem solving                         Safety/Judgement: Decreased awareness of safety   Problem Solving: Requires verbal cues;Requires tactile cues;Difficulty sequencing General Comments: pt quick to move/impulsive requiring max verbal cues and minA to prevent fall, pt with decreased insight to deficts wholistically and the level of assist he needs at this time     General Comments  VSS    Exercises     Shoulder Instructions      Home Living Family/patient expects to be discharged to:: Private residence Living Arrangements: Alone Available Help at Discharge:  (pt with dtr locally but can't assist, son lives in [place] and works) Type of Home: House Home Access: Stairs to enter Secretary/administrator of Steps: 2 Entrance Stairs-Rails: None Home Layout: One level (has a basement but doesn't have to go downt here)     Bathroom  Shower/Tub: Chief Strategy Officer: Standard     Home Equipment: Grab bars - tub/shower          Prior Functioning/Environment Prior Level of Function : Independent/Modified Independent             Mobility Comments: was indep without AD, driving ADLs Comments: indep        OT Problem List: Decreased strength;Decreased activity tolerance;Impaired balance (sitting and/or standing);Decreased coordination;Decreased cognition;Decreased safety awareness;Decreased knowledge of use of DME or AE      OT Treatment/Interventions: Self-care/ADL training;Therapeutic exercise;Energy conservation;DME and/or AE instruction;Therapeutic activities;Cognitive remediation/compensation;Patient/family education;Balance training    OT Goals(Current goals can be found in the care plan section) Acute Rehab OT Goals Patient Stated Goal: To go home OT Goal Formulation: With patient Time For Goal Achievement: 07/01/21 Potential to Achieve Goals: Good ADL Goals Pt Will Perform Grooming: with modified independence;standing Pt Will Perform Lower Body Bathing: with modified independence;sitting/lateral leans;sit to/from stand Pt Will Perform Lower Body Dressing: with modified independence;sitting/lateral leans;sit to/from stand Pt Will Transfer to Toilet: with modified independence;ambulating Pt Will Perform Toileting - Clothing Manipulation and hygiene: with modified independence;sitting/lateral leans;sit to/from stand Additional ADL Goal #1: Pt will complete medication management task with no errors. Additional ADL Goal #2: Pt will complete dynamic standing task with min guard assist as precursor for ADL's  OT Frequency: Min 2X/week   Barriers to D/C:    Pt lives alone  Co-evaluation              AM-PAC OT "6 Clicks" Daily Activity     Outcome Measure Help from another person eating meals?: A Little Help from another person taking care of personal grooming?: A  Little Help from another person toileting, which includes using toliet, bedpan, or urinal?: A Lot Help from another person bathing (including washing, rinsing, drying)?: A Lot Help from another person to put on and taking off regular upper body clothing?: A Little Help from another person to put on and taking off regular lower body clothing?: A Lot 6 Click Score: 15   End of Session Equipment Utilized During Treatment: Gait belt;Rolling walker (2 wheels) Nurse Communication: Mobility status  Activity Tolerance: Patient tolerated treatment well Patient left: in chair;with call bell/phone within reach;with chair alarm set  OT Visit Diagnosis: Unsteadiness on feet (R26.81);Other abnormalities of gait and mobility (R26.89);Muscle weakness (generalized) (M62.81)                Time: 1740-8144 OT Time Calculation (min): 34 min Charges:  OT General Charges $OT Visit: 1 Visit OT Evaluation $OT Eval Moderate Complexity: 1 Mod OT Treatments $Self Care/Home Management : 8-22 mins  Tawn Fitzner H., OTR/L Acute Rehabilitation  Nakeitha Milligan Elane Izaiyah Kleinman 06/17/2021, 4:06 PM

## 2021-06-17 NOTE — NC FL2 (Signed)
[place] MEDICAID FL2 LEVEL OF CARE SCREENING TOOL     IDENTIFICATION  Patient Name: Charles Hale Birthdate: December 19, 1940 Sex: male Admission Date (Current Location): 06/15/2021  [facility] and [place] Number:  Producer, television/film/video and Address:  The . [facility], [address] [place], [place] [address]      Provider Number: [id number]  Attending Physician Name and Address:  Leatha Gilding, MD  Relative Name and Phone Number:       Current Level of Care: Hospital Recommended Level of Care: Skilled Nursing Facility Prior Approval Number:    Date Approved/Denied:   PASRR Number: pending  Discharge Plan: SNF    Current Diagnoses: Patient Active Problem List   Diagnosis Date Noted   Cerebral thrombosis with cerebral infarction 06/17/2021   Essential hypertension 06/16/2021   Depression 06/16/2021   Osteoarthritis 06/16/2021   Insomnia 06/16/2021   Acute left-sided weakness 06/15/2021    Orientation RESPIRATION BLADDER Height & Weight     Self, Time, Situation, Place  Normal Continent Weight: 182 lb (82.6 kg) Height:  6\' 1"  (185.4 cm)  BEHAVIORAL SYMPTOMS/MOOD NEUROLOGICAL BOWEL NUTRITION STATUS      Continent Diet (See DC Summary)  AMBULATORY STATUS COMMUNICATION OF NEEDS Skin   Limited Assist Verbally Normal                       Personal Care Assistance Level of Assistance  Bathing, Feeding, Dressing Bathing Assistance: Limited assistance Feeding assistance: Limited assistance Dressing Assistance: Limited assistance     Functional Limitations Info  Sight Sight Info: Impaired        SPECIAL CARE FACTORS FREQUENCY  PT (By licensed PT), OT (By licensed OT)     PT Frequency: 5x/week OT Frequency: 5x/week            Contractures Contractures Info: Not present    Additional Factors Info  Code Status, Allergies, Psychotropic Code Status Info: Full Allergies Info: Codeine, Penicillins, Terbinafine Hcl Psychotropic  Info: Zoloft         Current Medications (06/17/2021):  This is the current hospital active medication list Current Facility-Administered Medications  Medication Dose Route Frequency Provider Last Rate Last Admin   acetaminophen (TYLENOL) tablet 650 mg  650 mg Oral Q4H PRN 06/19/2021, MD       Or   acetaminophen (TYLENOL) 160 MG/5ML solution 650 mg  650 mg Per Tube Q4H PRN Orland Mustard, MD       Or   acetaminophen (TYLENOL) suppository 650 mg  650 mg Rectal Q4H PRN Orland Mustard, MD       ALPRAZolam Orland Mustard) tablet 1-2 mg  1-2 mg Oral QHS PRN,MR X 1 Prudy Feeler, MD   2 mg at 06/16/21 2311   amLODipine (NORVASC) tablet 10 mg  10 mg Oral Daily 06/18/21, MD   10 mg at 06/17/21 1120   aspirin EC tablet 81 mg  81 mg Oral Daily 06/19/21, MD   81 mg at 06/17/21 1119   atorvastatin (LIPITOR) tablet 40 mg  40 mg Oral Daily 06/19/21, MD   40 mg at 06/17/21 1120   clopidogrel (PLAVIX) tablet 75 mg  75 mg Oral Daily 06/19/21, MD   75 mg at 06/17/21 1119   enoxaparin (LOVENOX) injection 40 mg  40 mg Subcutaneous Q24H 06/19/21, MD   40 mg at 06/16/21 2201   hydrALAZINE (APRESOLINE) injection 5 mg  5 mg Intravenous Q6H PRN 2202,  MD       senna-docusate (Senokot-S) tablet 1 tablet  1 tablet Oral QHS PRN Orland Mustard, MD       sertraline (ZOLOFT) tablet 100 mg  100 mg Oral QHS Orland Mustard, MD   100 mg at 06/16/21 2200   traMADol (ULTRAM) tablet 50 mg  50 mg Oral Q6H PRN Orland Mustard, MD         Discharge Medications: Please see discharge summary for a list of discharge medications.  Relevant Imaging Results:  Relevant Lab Results:   Additional Information SSN: [id number]. Pfizer vaccine 09/27/19  Mearl Latin, LCSW

## 2021-06-17 NOTE — Progress Notes (Addendum)
PROGRESS NOTE  Charles Hale PTW:[id number] DOB: [birth year] DOA: 06/15/2021 PCP: Doreen Salvage, PA-C   LOS: 1 day   Brief Narrative / Interim history: 80 year old male with HTN, OA, depression/anxiety who comes into the hospital with left-sided weakness for about 2 days prior to coming in.  He was found to have a stroke and admitted to the hospital.  Subjective / 24h Interval events: Doing well this morning, appreciates his left-sided weakness is starting to get a little bit better.  Asking about discharge.  Assessment & Plan: Principal Problem:   Cerebral thrombosis with cerebral infarction Active Problems:   Essential hypertension   Depression   Osteoarthritis   Insomnia  Principal Problem Acute CVA within the ventral right medulla oblongata-this was noted on the MRI on admission.  There was no hemorrhagic or mass-effect.  Neurology consulted. CT head negative for acute finging, but does focal severe stenosis/occlusion of the left A2 segment with distal reconstitution. moderate stenosis of the proximal right P2 segment and severe stenosis of the left P1/P2 junction.  Lipid panel shows an LDL of 96, continue atorvastatin.  He was not on statin prior to admission.  2D echo shows EF 60-65%, no WMA, grade I DD.  Therapy recommends CIR.  He is on dual antiplatelet therapy with aspirin and Plavix for now, appreciate neurology recommendations.   Active Problems Essential hypertension-for now allow permissive hypertension Home regimen includes amlodipine 10 mg daily. Depression -Continue zoloft  Osteoarthritis -Hold mobic. Tylenol and tramadol prn. Insomnia -Has been on xanax for over 15 years, discussed this is not a drug for insomnia, but with longevity of use will be hard to wean him off. Continue while inpatient.  Hx of spinal stenosis-Hx of trauma to his back and he recalls being told he had spinal stenosis.  3mm pulmonary nodule -F/u outpatient. If low risk, no further screening  recommended.    Body mass index is 24.01 kg/m.  Scheduled Meds:  amLODipine  10 mg Oral Daily   aspirin EC  81 mg Oral Daily   atorvastatin  40 mg Oral Daily   clopidogrel  75 mg Oral Daily   enoxaparin (LOVENOX) injection  40 mg Subcutaneous Q24H   sertraline  100 mg Oral QHS   Continuous Infusions: PRN Meds:.acetaminophen **OR** acetaminophen (TYLENOL) oral liquid 160 mg/5 mL **OR** acetaminophen, alprazolam, hydrALAZINE, senna-docusate, traMADol  Diet Orders (From admission, onward)     Start     Ordered   06/16/21 1845  Diet Heart Room service appropriate? Yes; Fluid consistency: Thin  Diet effective now       Question Answer Comment  Room service appropriate? Yes   Fluid consistency: Thin      06/16/21 1844            DVT prophylaxis: enoxaparin (LOVENOX) 40 MG/0.4ML injection Start: 06/16/21 2158 enoxaparin (LOVENOX) injection 40 mg Start: 06/16/21 1915     Code Status: Full Code  Family Communication: No family at bedside  Status is: Inpatient  Remains inpatient appropriate because: Persistent weakness  Level of care: Telemetry Medical  Consultants:  Neurology   Procedures:  2D echo: pending  Microbiology  none  Antimicrobials: none    Objective: Vitals:   06/16/21 1702 06/16/21 2350 06/17/21 0417 06/17/21 0812  BP: (!) 147/90 (!) 158/74 139/82 117/80  Pulse: 74 68 60 73  Resp: 18 16 14    Temp: 98.4 F (36.9 C) 98.6 F (37 C) (!) 97.5 F (36.4 C) 97.9 F (36.6 C)  TempSrc: Oral  Oral Oral Oral  SpO2: 100% 97% 97%   Weight:      Height:       No intake or output data in the 24 hours ending 06/17/21 1200 Filed Weights   06/15/21 1333  Weight: 82.6 kg    Examination:  Constitutional: NAD Eyes: no scleral icterus ENMT: Mucous membranes are moist.  Neck: normal, supple Respiratory: clear to auscultation bilaterally, no wheezing, no crackles. Normal respiratory effort. No accessory muscle use.  Cardiovascular: Regular rate and  rhythm, no murmurs / rubs / gallops. No LE edema. Good peripheral pulses Abdomen: non distended, no tenderness. Bowel sounds positive.  Musculoskeletal: no clubbing / cyanosis.  Skin: no rashes Neurologic: Left facial droop.  Weakness on the left side, good strength on right  Data Reviewed: I have independently reviewed following labs and imaging studies   CBC: Recent Labs  Lab 06/15/21 1902 06/16/21 1715  WBC 4.4 4.3  NEUTROABS 3.7 3.6  HGB 14.4 14.6  HCT 41.7 42.9  MCV 98.8 97.9  PLT 161 173   Basic Metabolic Panel: Recent Labs  Lab 06/15/21 1902  NA 137  K 4.2  CL 101  CO2 27  GLUCOSE 119*  BUN 19  CREATININE 0.81  CALCIUM 9.5   Liver Function Tests: Recent Labs  Lab 06/15/21 1902  AST 34  ALT 34  ALKPHOS 87  BILITOT 0.4  PROT 7.4  ALBUMIN 4.5   Coagulation Profile: Recent Labs  Lab 06/15/21 1902  INR 1.1   HbA1C: No results for input(s): HGBA1C in the last 72 hours. CBG: No results for input(s): GLUCAP in the last 168 hours.  Recent Results (from the past 240 hour(s))  Resp Panel by RT-PCR (Flu A&B, Covid) Nasopharyngeal Swab     Status: None   Collection Time: 06/15/21  7:02 PM   Specimen: Nasopharyngeal Swab; Nasopharyngeal(NP) swabs in vial transport medium  Result Value Ref Range Status   SARS Coronavirus 2 by RT PCR NEGATIVE NEGATIVE Final    Comment: (NOTE) SARS-CoV-2 target nucleic acids are NOT DETECTED.  The SARS-CoV-2 RNA is generally detectable in upper respiratory specimens during the acute phase of infection. The lowest concentration of SARS-CoV-2 viral copies this assay can detect is 138 copies/mL. A negative result does not preclude SARS-Cov-2 infection and should not be used as the sole basis for treatment or other patient management decisions. A negative result may occur with  improper specimen collection/handling, submission of specimen other than nasopharyngeal swab, presence of viral mutation(s) within the areas targeted  by this assay, and inadequate number of viral copies(<138 copies/mL). A negative result must be combined with clinical observations, patient history, and epidemiological information. The expected result is Negative.  Fact Sheet for Patients:  BloggerCourse.com  Fact Sheet for Healthcare Providers:  SeriousBroker.it  This test is no t yet approved or cleared by the [place] FDA and  has been authorized for detection and/or diagnosis of SARS-CoV-2 by FDA under an Emergency Use Authorization (EUA). This EUA will remain  in effect (meaning this test can be used) for the duration of the COVID-19 declaration under Section 564(b)(1) of the Act, 21 U.S.C.section 360bbb-3(b)(1), unless the authorization is terminated  or revoked sooner.       Influenza A by PCR NEGATIVE NEGATIVE Final   Influenza B by PCR NEGATIVE NEGATIVE Final    Comment: (NOTE) The Xpert Xpress SARS-CoV-2/FLU/RSV plus assay is intended as an aid in the diagnosis of influenza from Nasopharyngeal swab specimens and should not be  used as a sole basis for treatment. Nasal washings and aspirates are unacceptable for Xpert Xpress SARS-CoV-2/FLU/RSV testing.  Fact Sheet for Patients: BloggerCourse.com  Fact Sheet for Healthcare Providers: SeriousBroker.it  This test is not yet approved or cleared by the [place] FDA and has been authorized for detection and/or diagnosis of SARS-CoV-2 by FDA under an Emergency Use Authorization (EUA). This EUA will remain in effect (meaning this test can be used) for the duration of the COVID-19 declaration under Section 564(b)(1) of the Act, 21 U.S.C. section 360bbb-3(b)(1), unless the authorization is terminated or revoked.  Performed at [facility], [address] [place], [place] [address]      Radiology Studies: MR BRAIN WO CONTRAST  Result Date:  06/16/2021 CLINICAL DATA:  Acute neurologic deficit EXAM: MRI HEAD WITHOUT CONTRAST TECHNIQUE: Multiplanar, multiecho pulse sequences of the brain and surrounding structures were obtained without intravenous contrast. COMPARISON:  None. FINDINGS: Brain: Small focus of abnormal diffusion restriction within the ventral right medulla oblongata. No other diffusion abnormality. No acute or chronic hemorrhage. There is multifocal hyperintense T2-weighted signal within the white matter. Advanced atrophy for age. The midline structures are normal. Vascular: Major flow voids are preserved. Skull and upper cervical spine: Normal calvarium and skull base. Visualized upper cervical spine and soft tissues are normal. Sinuses/Orbits:No paranasal sinus fluid levels or advanced mucosal thickening. No mastoid or middle ear effusion. Normal orbits. IMPRESSION: 1. Small focus of acute ischemia within the ventral right medulla oblongata. No hemorrhage or mass effect. 2. Advanced atrophy and chronic ischemic microangiopathy. Electronically Signed   By: Deatra Robinson M.D.   On: 06/16/2021 20:43     Pamella Pert, MD, PhD [facility]  Between 7 am - 7 pm I am available, please contact me via Amion (for emergencies) or Securechat (non urgent messages)  Between 7 pm - 7 am I am not available, please contact night coverage MD/APP via Amion

## 2021-06-17 NOTE — Progress Notes (Signed)
Inpatient Rehab Admissions Coordinator:  ? ?Per therapy recommendations,  patient was screened for CIR candidacy by Taison Celani, MS, CCC-SLP. At this time, Pt. Appears to be a a potential candidate for CIR. I will place   order for rehab consult per protocol for full assessment. Please contact me any with questions. ? ?Renly Guedes, MS, CCC-SLP ?Rehab Admissions Coordinator  ?[phone] (celll) ?[phone] (office) ? ?

## 2021-06-17 NOTE — Evaluation (Signed)
Physical Therapy Evaluation Patient Details Name: Charles Hale MRN: [id number] DOB: 11/10/1940 Today's Date: 06/17/2021  History of Present Illness  Charles Hale is a 80 y.o. male with medical history significant of HTN, OA, depression and insomnia and ? Spinal stenosis that presented  to ED for left sided weakness. 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   Clinical Impression  Pt admitted with above. Pt was indep and living home alone PTA. Pt now presenting with L sided weakness, impaired L UE and LE co-ordination, impaired balance, increased falls risk, decreased insight to safety and deficits, and now requires assist for all mobility and ADLs. Recommend CIR upon d/c for aggressive rehab to achieve safe mod I level of function. Acute PT to cont to follow.       Recommendations for follow up therapy are one component of a multi-disciplinary discharge planning process, led by the attending physician.  Recommendations may be updated based on patient status, additional functional criteria and insurance authorization.  Follow Up Recommendations Acute inpatient rehab (3hours/day)    Assistance Recommended at Discharge    Functional Status Assessment Patient has had a recent decline in their functional status and demonstrates the ability to make significant improvements in function in a reasonable and predictable amount of time.  Equipment Recommendations  Rolling walker (2 wheels)    Recommendations for Other Services Rehab consult     Precautions / Restrictions Precautions Precautions: Fall Precaution Comments: L sided weakness Restrictions Weight Bearing Restrictions: No      Mobility  Bed Mobility Overal bed mobility: Needs Assistance Bed Mobility: Supine to Sit     Supine to sit: Min guard;HOB elevated     General bed mobility comments: HOB all the way up, pt pulling self quickly to EOB via hand rail and scooting bottom, increased time, impulsive requiring min guard for  safety and verbal cues not to go too far    Transfers Overall transfer level: Needs assistance Equipment used: Rolling walker (2 wheels) Transfers: Sit to/from Stand;Bed to chair/wheelchair/BSC Sit to Stand: Min assist;Mod assist Stand pivot transfers: Mod assist         General transfer comment: pt initially grabbed chair and asked if it was locked and then proceeded to pull self up and threw self into chair impulsivly requiring modA to prevent fall. spoke to pt about safety. with max verbal and tactile cues pt minA with sit to stand from chair to power up and steady during transition of hands, increased time    Ambulation/Gait Ambulation/Gait assistance: Mod assist;+2 safety/equipment Gait Distance (Feet): 30 Feet Assistive device: Rolling walker (2 wheels) Gait Pattern/deviations: Step-to pattern;Decreased step length - left;Decreased stance time - left;Decreased dorsiflexion - left;Narrow base of support Gait velocity: slow Gait velocity interpretation: <1.8 ft/sec, indicate of risk for recurrent falls   General Gait Details: pt with drop foot on the L causing pt to land on outside of foot and roll over to flat foot, pt also with adduction requiring tactile cues to maintain shoulder width apart base of support, modA for walker management and stability at hips due to L LE weakness and difficulty with advancing L LE, RN followed with chair  Stairs            Wheelchair Mobility    Modified Rankin (Stroke Patients Only) Modified Rankin (Stroke Patients Only) Pre-Morbid Rankin Score: No significant disability Modified Rankin: Moderately severe disability     Balance Overall balance assessment: Needs assistance Sitting-balance support: Feet supported;Single extremity supported  Sitting balance-Leahy Scale: Poor Sitting balance - Comments: pt reliant on UEs to maintain EOB sitting   Standing balance support: Bilateral upper extremity supported Standing balance-Leahy  Scale: Poor Standing balance comment: dependent on UEs/RW                             Pertinent Vitals/Pain Pain Assessment: No/denies pain    Home Living Family/patient expects to be discharged to:: Private residence Living Arrangements: Alone Available Help at Discharge:  (pt with dtr locally but can't assist, son lives in [place] and works) Type of Home: House Home Access: Stairs to enter Entrance Stairs-Rails: None Secretary/administrator of Steps: 2   Home Layout: One level (has a basement but doesn't have to go downt here) Home Equipment: Grab bars - tub/shower      Prior Function Prior Level of Function : Independent/Modified Independent             Mobility Comments: was indep without AD, driving ADLs Comments: indep     Hand Dominance   Dominant Hand: Right    Extremity/Trunk Assessment   Upper Extremity Assessment Upper Extremity Assessment: Defer to OT evaluation (noted L UE weakness)    Lower Extremity Assessment Lower Extremity Assessment: LLE deficits/detail LLE Deficits / Details: 2-/5 DF, 3-/5 quad and hip flexor LLE Coordination: decreased gross motor    Cervical / Trunk Assessment Cervical / Trunk Assessment: Normal  Communication   Communication: No difficulties  Cognition Arousal/Alertness: Awake/alert Behavior During Therapy: Impulsive Overall Cognitive Status: Impaired/Different from baseline Area of Impairment: Safety/judgement;Problem solving                         Safety/Judgement: Decreased awareness of safety   Problem Solving: Requires verbal cues;Requires tactile cues;Difficulty sequencing General Comments: pt quick to move/impulsive requiring max verbal cues and minA to prevent fall, pt with decreased insight to deficts wholistically and the level of assist he needs at this time        General Comments General comments (skin integrity, edema, etc.): VSS    Exercises     Assessment/Plan    PT  Assessment Patient needs continued PT services  PT Problem List Decreased strength;Decreased range of motion;Decreased activity tolerance;Decreased balance;Decreased mobility       PT Treatment Interventions DME instruction;Gait training;Stair training;Functional mobility training;Therapeutic activities;Therapeutic exercise;Balance training;Neuromuscular re-education    PT Goals (Current goals can be found in the Care Plan section)  Acute Rehab PT Goals Patient Stated Goal: home PT Goal Formulation: With patient Time For Goal Achievement: 07/01/21 Potential to Achieve Goals: Good    Frequency Min 4X/week   Barriers to discharge Decreased caregiver support son works and lives in [place], [place] is a diabetic and unable to assist per pt    Co-evaluation               AM-PAC PT "6 Clicks" Mobility  Outcome Measure Help needed turning from your back to your side while in a flat bed without using bedrails?: A Little Help needed moving from lying on your back to sitting on the side of a flat bed without using bedrails?: A Little Help needed moving to and from a bed to a chair (including a wheelchair)?: A Lot Help needed standing up from a chair using your arms (e.g., wheelchair or bedside chair)?: A Lot Help needed to walk in hospital room?: A Lot Help needed climbing 3-5 steps with a  railing? : A Lot 6 Click Score: 14    End of Session Equipment Utilized During Treatment: Gait belt Activity Tolerance: Patient tolerated treatment well Patient left: in chair;with call bell/phone within reach;with chair alarm set (with OT) Nurse Communication: Mobility status PT Visit Diagnosis: Unsteadiness on feet (R26.81);Muscle weakness (generalized) (M62.81);Difficulty in walking, not elsewhere classified (R26.2)    Time: 7106-2694 PT Time Calculation (min) (ACUTE ONLY): 34 min   Charges:   PT Evaluation $PT Eval Moderate Complexity: 1 Mod PT Treatments $Gait Training: 8-22 mins         Lewis Shock, PT, DPT Acute Rehabilitation Services Pager #: [phone] Office #: [phone]   Iona Hansen 06/17/2021, 1:33 PM

## 2021-06-18 DIAGNOSIS — I679 Cerebrovascular disease, unspecified: Secondary | ICD-10-CM

## 2021-06-18 MED ORDER — MELOXICAM 7.5 MG PO TABS
15.0000 mg | ORAL_TABLET | Freq: Every day | ORAL | Status: DC
  Administered 2021-06-18 – 2021-06-19 (×2): 15 mg via ORAL
  Filled 2021-06-18 (×2): qty 2

## 2021-06-18 NOTE — Plan of Care (Signed)
°  Problem: Education: °Goal: Knowledge of General Education information will improve °Description: Including pain rating scale, medication(s)/side effects and non-pharmacologic comfort measures °Outcome: Progressing °  °Problem: Health Behavior/Discharge Planning: °Goal: Ability to manage health-related needs will improve °Outcome: Progressing °  °Problem: Clinical Measurements: °Goal: Ability to maintain clinical measurements within normal limits will improve °Outcome: Progressing °Goal: Will remain free from infection °Outcome: Progressing °Goal: Diagnostic test results will improve °Outcome: Progressing °Goal: Respiratory complications will improve °Outcome: Progressing °Goal: Cardiovascular complication will be avoided °Outcome: Progressing °  °Problem: Activity: °Goal: Risk for activity intolerance will decrease °Outcome: Progressing °  °Problem: Nutrition: °Goal: Adequate nutrition will be maintained °Outcome: Progressing °  °Problem: Coping: °Goal: Level of anxiety will decrease °Outcome: Progressing °  °Problem: Elimination: °Goal: Will not experience complications related to bowel motility °Outcome: Progressing °Goal: Will not experience complications related to urinary retention °Outcome: Progressing °  °Problem: Pain Managment: °Goal: General experience of comfort will improve °Outcome: Progressing °  °Problem: Safety: °Goal: Ability to remain free from injury will improve °Outcome: Progressing °  °Problem: Skin Integrity: °Goal: Risk for impaired skin integrity will decrease °Outcome: Progressing °  °Problem: Education: °Goal: Knowledge of disease or condition will improve °Outcome: Progressing °Goal: Knowledge of secondary prevention will improve (SELECT ALL) °Outcome: Progressing °Goal: Knowledge of patient specific risk factors will improve (INDIVIDUALIZE FOR PATIENT) °Outcome: Progressing °Goal: Individualized Educational Video(s) °Outcome: Progressing °  °Problem: Coping: °Goal: Will verbalize  positive feelings about self °Outcome: Progressing °  °Problem: Health Behavior/Discharge Planning: °Goal: Ability to manage health-related needs will improve °Outcome: Progressing °  °Problem: Self-Care: °Goal: Ability to participate in self-care as condition permits will improve °Outcome: Progressing °Goal: Verbalization of feelings and concerns over difficulty with self-care will improve °Outcome: Progressing °  °Problem: Ischemic Stroke/TIA Tissue Perfusion: °Goal: Complications of ischemic stroke/TIA will be minimized °Outcome: Progressing °  °

## 2021-06-18 NOTE — Plan of Care (Signed)
  Problem: Pain Managment: Goal: General experience of comfort will improve Outcome: Progressing   Problem: Safety: Goal: Ability to remain free from injury will improve Outcome: Progressing   Problem: Skin Integrity: Goal: Risk for impaired skin integrity will decrease Outcome: Progressing   Problem: Education: Goal: Knowledge of disease or condition will improve Outcome: Progressing Goal: Knowledge of secondary prevention will improve (SELECT ALL) Outcome: Progressing   Problem: Coping: Goal: Will verbalize positive feelings about self Outcome: Progressing

## 2021-06-18 NOTE — Plan of Care (Signed)
  Problem: Pain Managment: Goal: General experience of comfort will improve Outcome: Progressing   Problem: Safety: Goal: Ability to remain free from injury will improve Outcome: Progressing   Problem: Skin Integrity: Goal: Risk for impaired skin integrity will decrease Outcome: Progressing   

## 2021-06-18 NOTE — Progress Notes (Signed)
RE: Charles Hale Date of Birth: 2041/03/05 Date: 06/18/21  Please be advised that the above-named patient will require a short-term nursing home stay - anticipated 30 days or less for rehabilitation and strengthening.  The plan is for return home.

## 2021-06-18 NOTE — Progress Notes (Signed)
PROGRESS NOTE  Charles Hale HCW:[id number] DOB: 1941/05/03 DOA: 06/15/2021 PCP: Doreen Salvage, PA-C   LOS: 2 days   Brief Narrative / Interim history: 80 year old male with HTN, OA, depression/anxiety who comes into the hospital with left-sided weakness for about 2 days prior to coming in.  He was found to have a stroke and admitted to the hospital.  Subjective / 24h Interval events: Doing well, no chest pain, no shortness of breath.  Still weak on the left side  Assessment & Plan: Principal Problem:   Cerebral thrombosis with cerebral infarction Active Problems:   Essential hypertension   Depression   Osteoarthritis   Insomnia  Principal Problem Acute CVA within the ventral right medulla oblongata-this was noted on the MRI on admission.  There was no hemorrhagic or mass-effect.  Neurology consulted. CT head negative for acute finging, but does focal severe stenosis/occlusion of the left A2 segment with distal reconstitution. moderate stenosis of the proximal right P2 segment and severe stenosis of the left P1/P2 junction.  Lipid panel shows an LDL of 96, continue atorvastatin 40 mg.  He was not on statin prior to admission.  2D echo shows EF 60-65%, no WMA, grade I DD.  Therapy recommends CIR, however due to lack of outpatient support and now has been changed to SNF.neurology recommends dual antiplatelet therapy with aspirin 81 and Plavix 75 for 3 weeks followed by aspirin alone.    Active Problems Essential hypertension-continue amlodipine Hyperlipidemia-now on atorvastatin 40 Depression -Continue zoloft  Osteoarthritis -continue mobic. Tylenol and tramadol prn. Insomnia -Has been on xanax for over 15 years, discussed this is not a drug for insomnia, but with longevity of use will be hard to wean him off. Continue while inpatient.  Hx of spinal stenosis-Hx of trauma to his back and he recalls being told he had spinal stenosis.  19mm pulmonary nodule -F/u outpatient. If low risk, no  further screening recommended.    Body mass index is 24.01 kg/m.  Scheduled Meds:  amLODipine  10 mg Oral Daily   aspirin EC  81 mg Oral Daily   atorvastatin  40 mg Oral Daily   clopidogrel  75 mg Oral Daily   enoxaparin (LOVENOX) injection  40 mg Subcutaneous Q24H   meloxicam  15 mg Oral Daily   sertraline  100 mg Oral QHS   Continuous Infusions: PRN Meds:.acetaminophen **OR** acetaminophen (TYLENOL) oral liquid 160 mg/5 mL **OR** acetaminophen, alprazolam, hydrALAZINE, senna-docusate, traMADol  Diet Orders (From admission, onward)     Start     Ordered   06/16/21 1845  Diet Heart Room service appropriate? Yes; Fluid consistency: Thin  Diet effective now       Question Answer Comment  Room service appropriate? Yes   Fluid consistency: Thin      06/16/21 1844            DVT prophylaxis: enoxaparin (LOVENOX) injection 40 mg Start: 06/16/21 1915     Code Status: Full Code  Family Communication: No family at bedside  Status is: Inpatient  Remains inpatient appropriate because: Persistent weakness  Level of care: Telemetry Medical  Consultants:  Neurology   Procedures:  2D echo: pending  Microbiology  none  Antimicrobials: none    Objective: Vitals:   06/17/21 1912 06/17/21 2334 06/18/21 0354 06/18/21 0805  BP: (!) 146/81 (!) 154/80 125/82 (!) 152/83  Pulse: 80 65 63   Resp: 19 16 20 11   Temp: 98.2 F (36.8 C) 98.1 F (36.7 C) 97.9 F (36.6  C)   TempSrc: Oral Oral Oral   SpO2: 98% 97% 96%   Weight:      Height:        Intake/Output Summary (Last 24 hours) at 06/18/2021 1132 Last data filed at 06/18/2021 0930 Gross per 24 hour  Intake --  Output 875 ml  Net -875 ml   Filed Weights   06/15/21 1333  Weight: 82.6 kg    Examination:  Constitutional: No distress Eyes: Anicteric ENMT: Moist membranes t.  Neck: normal, supple Respiratory: Lungs are clear bilaterally without wheezing or crackles Cardiovascular: Regular rate and rhythm, no  murmurs, no edema Abdomen: Soft, nontender, nondistended, bowel sounds positive Musculoskeletal: no clubbing / cyanosis.  Skin: no rashes seen Neurologic: Left facial droop.  Weakness on the left side, good strength on right, no new focal deficits  Data Reviewed: I have independently reviewed following labs and imaging studies   CBC: Recent Labs  Lab 06/15/21 1902 06/16/21 1715  WBC 4.4 4.3  NEUTROABS 3.7 3.6  HGB 14.4 14.6  HCT 41.7 42.9  MCV 98.8 97.9  PLT 161 173    Basic Metabolic Panel: Recent Labs  Lab 06/15/21 1902  NA 137  K 4.2  CL 101  CO2 27  GLUCOSE 119*  BUN 19  CREATININE 0.81  CALCIUM 9.5    Liver Function Tests: Recent Labs  Lab 06/15/21 1902  AST 34  ALT 34  ALKPHOS 87  BILITOT 0.4  PROT 7.4  ALBUMIN 4.5    Coagulation Profile: Recent Labs  Lab 06/15/21 1902  INR 1.1    HbA1C: Recent Labs    06/17/21 0405  HGBA1C 5.5   CBG: No results for input(s): GLUCAP in the last 168 hours.  Recent Results (from the past 240 hour(s))  Resp Panel by RT-PCR (Flu A&B, Covid) Nasopharyngeal Swab     Status: None   Collection Time: 06/15/21  7:02 PM   Specimen: Nasopharyngeal Swab; Nasopharyngeal(NP) swabs in vial transport medium  Result Value Ref Range Status   SARS Coronavirus 2 by RT PCR NEGATIVE NEGATIVE Final    Comment: (NOTE) SARS-CoV-2 target nucleic acids are NOT DETECTED.  The SARS-CoV-2 RNA is generally detectable in upper respiratory specimens during the acute phase of infection. The lowest concentration of SARS-CoV-2 viral copies this assay can detect is 138 copies/mL. A negative result does not preclude SARS-Cov-2 infection and should not be used as the sole basis for treatment or other patient management decisions. A negative result may occur with  improper specimen collection/handling, submission of specimen other than nasopharyngeal swab, presence of viral mutation(s) within the areas targeted by this assay, and  inadequate number of viral copies(<138 copies/mL). A negative result must be combined with clinical observations, patient history, and epidemiological information. The expected result is Negative.  Fact Sheet for Patients:  BloggerCourse.com  Fact Sheet for Healthcare Providers:  SeriousBroker.it  This test is no t yet approved or cleared by the [place] FDA and  has been authorized for detection and/or diagnosis of SARS-CoV-2 by FDA under an Emergency Use Authorization (EUA). This EUA will remain  in effect (meaning this test can be used) for the duration of the COVID-19 declaration under Section 564(b)(1) of the Act, 21 U.S.C.section 360bbb-3(b)(1), unless the authorization is terminated  or revoked sooner.       Influenza A by PCR NEGATIVE NEGATIVE Final   Influenza B by PCR NEGATIVE NEGATIVE Final    Comment: (NOTE) The Xpert Xpress SARS-CoV-2/FLU/RSV plus assay is intended  as an aid in the diagnosis of influenza from Nasopharyngeal swab specimens and should not be used as a sole basis for treatment. Nasal washings and aspirates are unacceptable for Xpert Xpress SARS-CoV-2/FLU/RSV testing.  Fact Sheet for Patients: BloggerCourse.com  Fact Sheet for Healthcare Providers: SeriousBroker.it  This test is not yet approved or cleared by the [place] FDA and has been authorized for detection and/or diagnosis of SARS-CoV-2 by FDA under an Emergency Use Authorization (EUA). This EUA will remain in effect (meaning this test can be used) for the duration of the COVID-19 declaration under Section 564(b)(1) of the Act, 21 U.S.C. section 360bbb-3(b)(1), unless the authorization is terminated or revoked.  Performed at [facility], [address] [place], [place] [address]       Radiology Studies: No results found.   Pamella Pert, MD, PhD [facility]  Between 7 am - 7 pm I am available, please contact me via Amion (for emergencies) or Securechat (non urgent messages)  Between 7 pm - 7 am I am not available, please contact night coverage MD/APP via Amion

## 2021-06-18 NOTE — TOC Initial Note (Addendum)
Transition of Care [facility]) - Initial/Assessment Note    Patient Details  Name: Charles Hale MRN: [id number] Date of Birth: 12-02-1940  [facility] CM/SW Contact:    Kermit Balo, RN Phone Number: 06/18/2021, 11:25 AM  Clinical Narrative:                 CM spoke to patients son per pt preference yesterday.  Son states patient will not have support after a CIR stay is that will be needed. Son asking for information on SNF in this [facility] area. CM sent the choices via Email per son preference.  CM called this am and son has not reviewed the information. CM has encouraged him to review the choices and get back with a decision today as pt will be ready for d/c to SNF tomorrow.  TOC following.  1522: Son asked for [facility] in [place]. CM has sent them the referral. They will update CM after reviewing.   Expected Discharge Plan: Skilled Nursing Facility Barriers to Discharge: Continued Medical Work up   Patient Goals and CMS Choice   CMS Medicare.gov Compare Post Acute Care list provided to:: Patient Choice offered to / list presented to : Adult Children  Expected Discharge Plan and Services Expected Discharge Plan: Skilled Nursing Facility In-house Referral: Clinical Social Work Discharge Planning Services: CM Consult Post Acute Care Choice: Skilled Nursing Facility Living arrangements for the past 2 months: Single Family Home                                      Prior Living Arrangements/Services Living arrangements for the past 2 months: Single Family Home Lives with:: Self Patient language and need for interpreter reviewed:: Yes Do you feel safe going back to the place where you live?: Yes      Need for Family Participation in Patient Care: Yes (Comment) Care giver support system in place?: No (comment)   Criminal Activity/Legal Involvement Pertinent to Current Situation/Hospitalization: No - Comment as needed  Activities of Daily  Living      Permission Sought/Granted                  Emotional Assessment Appearance:: Appears stated age     Orientation: : Oriented to Self, Oriented to Place, Oriented to  Time, Oriented to Situation   Psych Involvement: No (comment)  Admission diagnosis:  Stroke [facility]) [I63.9] Left leg weakness [R29.898] Patient Active Problem List   Diagnosis Date Noted   Cerebral thrombosis with cerebral infarction 06/17/2021   Essential hypertension 06/16/2021   Depression 06/16/2021   Osteoarthritis 06/16/2021   Insomnia 06/16/2021   Acute left-sided weakness 06/15/2021   PCP:  Perley Jain Pharmacy:   [id number] - [place], [place] - [address] [place] [address] Phone: [phone] Fax: [phone]     Social Determinants of Health (SDOH) Interventions    Readmission Risk Interventions No flowsheet data found.

## 2021-06-19 LAB — RESP PANEL BY RT-PCR (FLU A&B, COVID) ARPGX2
Influenza A by PCR: NEGATIVE
Influenza B by PCR: NEGATIVE
SARS Coronavirus 2 by RT PCR: NEGATIVE

## 2021-06-19 MED ORDER — ALPRAZOLAM 2 MG PO TABS: 1.0000 mg | ORAL_TABLET | ORAL | 0 refills | Status: DC

## 2021-06-19 MED ORDER — ASPIRIN 81 MG PO TBEC: 81.0000 mg | DELAYED_RELEASE_TABLET | Freq: Every day | ORAL | 11 refills | Status: DC

## 2021-06-19 MED ORDER — ATORVASTATIN CALCIUM 40 MG PO TABS: 40.0000 mg | ORAL_TABLET | Freq: Every day | ORAL | Status: DC

## 2021-06-19 MED ORDER — CLOPIDOGREL BISULFATE 75 MG PO TABS: 75.0000 mg | ORAL_TABLET | Freq: Every day | ORAL | 0 refills | Status: AC

## 2021-06-19 NOTE — Discharge Summary (Signed)
Physician Discharge Summary  Charles Hale VWP:[id number] DOB: [birth year] DOA: 06/15/2021  PCP: Doreen Salvage, PA-C  Admit date: 06/15/2021 Discharge date: 06/19/2021  Admitted From: home Disposition:  SNF  Recommendations for Outpatient Follow-up:  Follow up with PCP in 1-2 weeks Follow up with neurology in 1 month  Home Health: none Equipment/Devices: none  Discharge Condition: stable CODE STATUS: Full code Diet recommendation: heart healthy  HPI: Per admitting MD, Charles Hale is a 80 y.o. male with medical history significant of HTN, OA, depression and insomnia and ? Spinal stenosis that presented  to ED for left sided weakness. On Saturday night he got up to go the bathroom and reached for the door jam and he missed and fell onto his left hip. He got back up and was able to go back to bed.  Sunday morning he could not stand up and states his balance was so bad and felt like his left leg was very weak. He had to crawl because he couldn't walk initially and then was able to pull himself up and call his son. He states his left hand also feels weak. He denies any facial droop, slurred speech, confusion or dizziness. Sensation remains intact. He denies any fever, chills, vision changes, chest pain, or palpitations, shortness of breath or cough, abdominal pain, N/V/D, leg swelling, dysuria. He has no known history of HLD or diabetes. He has never smoked. Has a past history of drinking, but stopped a few years ago.   Hospital Course / Discharge diagnoses:   Principal Problem Acute CVA within the ventral right medulla oblongata-this was noted on the MRI on admission.  There was no hemorrhagic or mass-effect.  Neurology consulted. CT head negative for acute finging, but does focal severe stenosis/occlusion of the left A2 segment with distal reconstitution. moderate stenosis of the proximal right P2 segment and severe stenosis of the left P1/P2 junction.  Lipid panel shows an LDL of 96,  continue atorvastatin 40 mg.  He was not on statin prior to admission.  2D echo shows EF 60-65%, no WMA, grade I DD.  He will be discharged to ST rehab. Neurology recommends dual antiplatelet therapy with aspirin 81 and Plavix 75 for 3 weeks followed by aspirin alone.     Active Problems Essential hypertension-continue amlodipine Hyperlipidemia-now on atorvastatin 40 Depression -Continue zoloft  Osteoarthritis -continue mobic. Tylenol and tramadol prn. Insomnia -Has been on xanax for over 15 years, discussed this is not a drug for insomnia, but with longevity of use will be hard to wean him off. Continue while inpatient.  Hx of spinal stenosis-Hx of trauma to his back and he recalls being told he had spinal stenosis.  35mm pulmonary nodule -F/u outpatient. If low risk, no further screening recommended.   Sepsis ruled out   Discharge Instructions  Discharge Instructions     Ambulatory referral to Neurology   Complete by: As directed    Follow up with stroke clinic NP (Jessica Vanschaick or Darrol Angel, if both not available, consider Manson Allan, or Ahern) at [facility] in about 4 weeks. Thanks.      Allergies as of 06/19/2021       Reactions   Codeine Nausea Only   Penicillins Hives   Terbinafine Hcl Hives, Other (See Comments)   "Hives for 2 weeks"        Medication List     TAKE these medications    alprazolam 2 MG tablet Commonly known as: XANAX Take 0.5 tablets (1  mg total) by mouth See admin instructions. Take 1 mg by mouth at bedtime and additional 1 mg three to four hours later   amLODipine 10 MG tablet Commonly known as: NORVASC Take 10 mg by mouth at bedtime.   aspirin 81 MG EC tablet Take 1 tablet (81 mg total) by mouth daily. Swallow whole. Start taking on: June 20, 2021   atorvastatin 40 MG tablet Commonly known as: LIPITOR Take 1 tablet (40 mg total) by mouth daily. Start taking on: June 20, 2021   clopidogrel 75 MG tablet Commonly known as:  PLAVIX Take 1 tablet (75 mg total) by mouth daily for 21 days. Start taking on: June 20, 2021   meloxicam 15 MG tablet Commonly known as: MOBIC Take 15 mg by mouth in the morning.   One-A-Day Mens 50+ Tabs Take 1 tablet by mouth daily.   sertraline 100 MG tablet Commonly known as: ZOLOFT Take 100 mg by mouth at bedtime.   VITAMIN B-12 PO Take 1 tablet by mouth daily.   VITAMIN E PO Take 1 capsule by mouth daily.        Follow-up Information     [facility]. Schedule an appointment as soon as possible for a visit in 1 month(s).   Specialty: Neurology Why: stroke clinic Contact information: [place] [phone]                Consultations: Neurology   Procedures/Studies:   CT ANGIO HEAD NECK W WO CM  Result Date: 06/15/2021 CLINICAL DATA:  Stroke/TIA, fall yesterday with tingling and numbness in the legs EXAM: CT ANGIOGRAPHY HEAD AND NECK TECHNIQUE: Multidetector CT imaging of the head and neck was performed using the standard protocol during bolus administration of intravenous contrast. Multiplanar CT image reconstructions and MIPs were obtained to evaluate the vascular anatomy. Carotid stenosis measurements (when applicable) are obtained utilizing NASCET criteria, using the distal internal carotid diameter as the denominator. CONTRAST:  2mL OMNIPAQUE IOHEXOL 350 MG/ML SOLN COMPARISON:  None. FINDINGS: CT HEAD FINDINGS Brain: There is no evidence of acute intracranial hemorrhage, extra-axial fluid collection, or acute infarct. There is mild global parenchymal volume loss. The lateral ventricles are dilated which appears out of proportion to the degree of parenchymal volume loss, and there is crowding of the gyri at the vertex. Patchy hypodensity in the subcortical and periventricular white matter is nonspecific but likely reflects sequela of chronic white matter microangiopathy. There is no  solid mass lesion.  There is no midline shift. Vascular: There is calcification of the bilateral cavernous ICAs. Skull: Normal. Negative for fracture or focal lesion. Sinuses and orbits: The paranasal sinuses are clear. Bilateral lens implants are in place. The globes and orbits are otherwise unremarkable. Review of the MIP images confirms the above findings CTA NECK FINDINGS Aortic arch: There is mild calcified atherosclerotic plaque of the aortic arch. The origins of the major branch vessels are patent. There is no evidence of aneurysm or dissection to the level imaged. Right carotid system: There is soft and calcified atherosclerotic plaque in the proximal right internal carotid artery resulting in up to approximately 50-60% stenosis. There is severe stenosis at the origin of the right external carotid artery (13-77). The high cervical internal carotid artery is tortuous. There is no evidence of dissection or aneurysm. Left carotid system: There is calcified atherosclerotic plaque in the proximal left internal carotid artery resulting in up to approximately 40-50% stenosis. The left external carotid  artery is patent. There is no evidence of dissection or aneurysm. The high cervical left internal carotid artery is tortuous. Vertebral arteries: The vertebral arteries are patent, without hemodynamically significant stenosis, occlusion, dissection, or aneurysm. Skeleton: There is advanced multilevel degenerative change throughout the cervical spine, most severe at C5-C6. There is degenerative pannus about the dens resulting in mild narrowing of the craniocervical junction. There is no visible canal hematoma. There is no acute osseous abnormality or aggressive osseous lesion. Other neck: The soft tissues are unremarkable. Upper chest: There is biapical scarring. There are a few small nodules measuring up to 4 mm. Review of the MIP images confirms the above findings CTA HEAD FINDINGS Anterior circulation: There is  calcified atherosclerotic plaque in the bilateral intracranial ICAs without hemodynamically significant stenosis or occlusion. The bilateral MCAs are patent. There is focal high-grade stenosis/occlusion of the left A2 segment with distal reconstitution (11-282). The bilateral ACAs are otherwise patent. There is no aneurysm. Posterior circulation: There is a PICA termination of the right vertebral artery, a normal variant. There is mild calcified atherosclerotic plaque in the left V4 segment resulting in mild stenosis. The basilar artery is patent. There is focal moderate stenosis of the proximal right P2 segment (11-252). There is mild multifocal irregularity of the distal right PCA. There is focal severe stenosis at the left P1/P2 junction (11-247). The left PCA is otherwise patent with mild multifocal irregularity of the distal branches. There is no aneurysm. Venous sinuses: Patent. Anatomic variants: As above. Review of the MIP images confirms the above findings IMPRESSION: CT head: 1. No acute intracranial pathology. 2. Dilation of the lateral ventricles which appears out of proportion to degree of parenchymal volume loss. This may reflect central greater than peripheral parenchymal volume loss; however, normal pressure hydrocephalus should be considered. 3. Mild chronic white matter microangiopathy. CTA head and neck: 1. Focal severe stenosis/occlusion of the left A2 segment with distal reconstitution. 2. Additional intracranial atherosclerotic disease as detailed above resulting in up to moderate stenosis of the proximal right P2 segment and severe stenosis of the left P1/P2 junction. No other high-grade stenosis of the intracranial vasculature. 3. Calcified atherosclerotic plaque in the bilateral carotid bulbs resulting in up to approximately 50-60% stenosis on the right and 40-50% stenosis on the left. 4. Stenosis at the origin of the right external carotid artery. 5. Advanced multilevel degenerative changes  in the cervical spine, most severe at C5-C6. 6. Sub 6 mm pulmonary nodules in the lung apices. No follow-up needed if patient is low-risk. Non-contrast chest CT can be considered in 12 months if patient is high-risk. This recommendation follows the consensus statement: Guidelines for Management of Incidental Pulmonary Nodules Detected on CT Images: From the Fleischner Society 2017; Radiology 2017; 284:228-243. Electronically Signed   By: Valetta Mole M.D.   On: 06/15/2021 21:12   MR BRAIN WO CONTRAST  Result Date: 06/16/2021 CLINICAL DATA:  Acute neurologic deficit EXAM: MRI HEAD WITHOUT CONTRAST TECHNIQUE: Multiplanar, multiecho pulse sequences of the brain and surrounding structures were obtained without intravenous contrast. COMPARISON:  None. FINDINGS: Brain: Small focus of abnormal diffusion restriction within the ventral right medulla oblongata. No other diffusion abnormality. No acute or chronic hemorrhage. There is multifocal hyperintense T2-weighted signal within the white matter. Advanced atrophy for age. The midline structures are normal. Vascular: Major flow voids are preserved. Skull and upper cervical spine: Normal calvarium and skull base. Visualized upper cervical spine and soft tissues are normal. Sinuses/Orbits:No paranasal sinus fluid levels or advanced  mucosal thickening. No mastoid or middle ear effusion. Normal orbits. IMPRESSION: 1. Small focus of acute ischemia within the ventral right medulla oblongata. No hemorrhage or mass effect. 2. Advanced atrophy and chronic ischemic microangiopathy. Electronically Signed   By: Ulyses Jarred M.D.   On: 06/16/2021 20:43   ECHOCARDIOGRAM COMPLETE  Result Date: 06/17/2021    ECHOCARDIOGRAM REPORT   Patient Name:   THEODOROS HENIGE Date of Exam: 06/17/2021 Medical Rec #:  [id number]       Height:       73.0 in Accession #:    [id number]      Weight:       182.0 lb Date of Birth:  [birth year]      BSA:          2.067 m Patient Age:    24 years         BP:           117/80 mmHg Patient Gender: M               HR:           69 bpm. Exam Location:  Inpatient Procedure: 2D Echo, Color Doppler and Cardiac Doppler Indications:    stroke  History:        Patient has no prior history of Echocardiogram examinations.                 Risk Factors:Hypertension.  Sonographer:    Melissa Morford RDCS (AE, PE) Referring Phys: [id number] Lavina  1. Left ventricular ejection fraction, by estimation, is 60 to 65%. Left ventricular ejection fraction by 3D volume is 66 %. The left ventricle has normal function. The left ventricle has no regional wall motion abnormalities. Left ventricular diastolic  parameters are consistent with Grade I diastolic dysfunction (impaired relaxation).  2. Right ventricular systolic function is normal. The right ventricular size is normal. There is normal pulmonary artery systolic pressure.  3. There is mild posterior annular calcification. No evidence of mitral valve regurgitation. No evidence of mitral stenosis.  4. The aortic valve is normal in structure. Aortic valve regurgitation is not visualized. Aortic valve sclerosis is present, with no evidence of aortic valve stenosis.  5. The inferior vena cava is normal in size with greater than 50% respiratory variability, suggesting right atrial pressure of 3 mmHg.  6. Tricuspid valve regurgitation is mild to moderate. FINDINGS  Left Ventricle: Left ventricular ejection fraction, by estimation, is 60 to 65%. Left ventricular ejection fraction by 3D volume is 66 %. The left ventricle has normal function. The left ventricle has no regional wall motion abnormalities. The left ventricular internal cavity size was normal in size. There is no left ventricular hypertrophy. Left ventricular diastolic parameters are consistent with Grade I diastolic dysfunction (impaired relaxation). Right Ventricle: The right ventricular size is normal. No increase in right ventricular wall thickness. Right  ventricular systolic function is normal. There is normal pulmonary artery systolic pressure. The tricuspid regurgitant velocity is 2.38 m/s, and  with an assumed right atrial pressure of 3 mmHg, the estimated right ventricular systolic pressure is [id number] mmHg. Left Atrium: Left atrial size was normal in size. Right Atrium: Right atrial size was normal in size. Pericardium: There is no evidence of pericardial effusion. Mitral Valve: The mitral valve is abnormal. Mild mitral annular calcification. Trivial mitral valve regurgitation. No evidence of mitral valve stenosis. Tricuspid Valve: The tricuspid valve is normal in structure. Tricuspid valve regurgitation is mild to moderate.  No evidence of tricuspid stenosis. Aortic Valve: The aortic valve is normal in structure. Aortic valve regurgitation is not visualized. Aortic valve sclerosis is present, with no evidence of aortic valve stenosis. Pulmonic Valve: The pulmonic valve was not well visualized. Pulmonic valve regurgitation is not visualized. No evidence of pulmonic stenosis. Aorta: The aortic root is normal in size and structure. Venous: The inferior vena cava is normal in size with greater than 50% respiratory variability, suggesting right atrial pressure of 3 mmHg. IAS/Shunts: No atrial level shunt detected by color flow Doppler.  LEFT VENTRICLE PLAX 2D LVIDd:         4.60 cm         Diastology LVIDs:         3.60 cm         LV e' medial:    9.90 cm/s LV PW:         1.00 cm         LV E/e' medial:  6.2 LV IVS:        0.90 cm         LV e' lateral:   12.00 cm/s LVOT diam:     2.20 cm         LV E/e' lateral: 5.1 LV SV:         66 LV SV Index:   32 LVOT Area:     3.80 cm        3D Volume EF                                LV 3D EF:    Left                                             ventricul                                             ar                                             ejection                                             fraction                                              by 3D                                             volume is  66 %.                                 3D Volume EF:                                3D EF:        66 %                                LV EDV:       119 ml                                LV ESV:       41 ml                                LV SV:        78 ml RIGHT VENTRICLE RV S prime:     9.46 cm/s TAPSE (M-mode): 1.5 cm LEFT ATRIUM             Index        RIGHT ATRIUM           Index LA diam:        3.80 cm 1.84 cm/m   RA Area:     12.30 cm LA Vol (A2C):   34.8 ml 16.84 ml/m  RA Volume:   24.20 ml  11.71 ml/m LA Vol (A4C):   35.3 ml 17.08 ml/m LA Biplane Vol: 35.9 ml 17.37 ml/m  AORTIC VALVE LVOT Vmax:   95.80 cm/s LVOT Vmean:  63.200 cm/s LVOT VTI:    0.174 m  AORTA Ao Root diam: 3.10 cm MITRAL VALVE               TRICUSPID VALVE MV Area (PHT): 2.60 cm    TR Peak grad:   22.7 mmHg MV Decel Time: 292 msec    TR Vmax:        238.00 cm/s MV E velocity: 60.90 cm/s MV A velocity: 78.50 cm/s  SHUNTS MV E/A ratio:  0.78        Systemic VTI:  0.17 m                            Systemic Diam: 2.20 cm Kardie Tobb DO Electronically signed by Berniece Salines DO Signature Date/Time: 06/17/2021/1:34:34 PM    Final    DG Hip Unilat With Pelvis 2-3 Views Left  Result Date: 06/15/2021 CLINICAL DATA:  Fall.  LEFT hip pain EXAM: DG HIP (WITH OR WITHOUT PELVIS) 2-3V LEFT COMPARISON:  None. FINDINGS: Hips are located. No evidence of pelvic fracture or sacral fracture. Dedicated view of the LEFT hip demonstrates no femoral neck fracture. IMPRESSION: No pelvic fracture or hip fracture Electronically Signed   By: Suzy Bouchard M.D.   On: 06/15/2021 14:22     Subjective: - no chest pain, shortness of breath, no abdominal pain, nausea or vomiting.   Discharge Exam: BP 137/80 (BP Location: Left Arm)   Pulse 70   Temp 97.9 F (36.6 C) (Tympanic)   Resp 13   Ht 6\' 1"  (1.854 m)  Wt 82.6 kg   SpO2  97%   BMI 24.01 kg/m   General: Pt is alert, awake, not in acute distress Cardiovascular: RRR, S1/S2 +, no rubs, no gallops Respiratory: CTA bilaterally, no wheezing, no rhonchi Abdominal: Soft, NT, ND, bowel sounds + Extremities: no edema, no cyanosis    The results of significant diagnostics from this hospitalization (including imaging, microbiology, ancillary and laboratory) are listed below for reference.     Microbiology: Recent Results (from the past 240 hour(s))  Resp Panel by RT-PCR (Flu A&B, Covid) Nasopharyngeal Swab     Status: None   Collection Time: 06/15/21  7:02 PM   Specimen: Nasopharyngeal Swab; Nasopharyngeal(NP) swabs in vial transport medium  Result Value Ref Range Status   SARS Coronavirus 2 by RT PCR NEGATIVE NEGATIVE Final    Comment: (NOTE) SARS-CoV-2 target nucleic acids are NOT DETECTED.  The SARS-CoV-2 RNA is generally detectable in upper respiratory specimens during the acute phase of infection. The lowest concentration of SARS-CoV-2 viral copies this assay can detect is 138 copies/mL. A negative result does not preclude SARS-Cov-2 infection and should not be used as the sole basis for treatment or other patient management decisions. A negative result may occur with  improper specimen collection/handling, submission of specimen other than nasopharyngeal swab, presence of viral mutation(s) within the areas targeted by this assay, and inadequate number of viral copies(<138 copies/mL). A negative result must be combined with clinical observations, patient history, and epidemiological information. The expected result is Negative.  Fact Sheet for Patients:  EntrepreneurPulse.com.au  Fact Sheet for Healthcare Providers:  IncredibleEmployment.be  This test is no t yet approved or cleared by the [place] FDA and  has been authorized for detection and/or diagnosis of SARS-CoV-2 by FDA under an Emergency Use  Authorization (EUA). This EUA will remain  in effect (meaning this test can be used) for the duration of the COVID-19 declaration under Section 564(b)(1) of the Act, 21 U.S.C.section 360bbb-3(b)(1), unless the authorization is terminated  or revoked sooner.       Influenza A by PCR NEGATIVE NEGATIVE Final   Influenza B by PCR NEGATIVE NEGATIVE Final    Comment: (NOTE) The Xpert Xpress SARS-CoV-2/FLU/RSV plus assay is intended as an aid in the diagnosis of influenza from Nasopharyngeal swab specimens and should not be used as a sole basis for treatment. Nasal washings and aspirates are unacceptable for Xpert Xpress SARS-CoV-2/FLU/RSV testing.  Fact Sheet for Patients: EntrepreneurPulse.com.au  Fact Sheet for Healthcare Providers: IncredibleEmployment.be  This test is not yet approved or cleared by the [place] FDA and has been authorized for detection and/or diagnosis of SARS-CoV-2 by FDA under an Emergency Use Authorization (EUA). This EUA will remain in effect (meaning this test can be used) for the duration of the COVID-19 declaration under Section 564(b)(1) of the Act, 21 U.S.C. section 360bbb-3(b)(1), unless the authorization is terminated or revoked.  Performed at [facility], [address] [place], [place] [address]      Labs: Basic Metabolic Panel: Recent Labs  Lab 06/15/21 1902  NA 137  K 4.2  CL 101  CO2 27  GLUCOSE 119*  BUN 19  CREATININE 0.81  CALCIUM 9.5   Liver Function Tests: Recent Labs  Lab 06/15/21 1902  AST 34  ALT 34  ALKPHOS 87  BILITOT 0.4  PROT 7.4  ALBUMIN 4.5   CBC: Recent Labs  Lab 06/15/21 1902 06/16/21 1715  WBC 4.4 4.3  NEUTROABS 3.7 3.6  HGB  14.4 14.6  HCT 41.7 42.9  MCV 98.8 97.9  PLT 161 173   CBG: No results for input(s): GLUCAP in the last 168 hours. Hgb A1c Recent Labs    06/17/21 0405  HGBA1C 5.5   Lipid Profile Recent Labs    06/17/21 0405   CHOL 176  HDL 59  LDLCALC 96  TRIG 107  CHOLHDL 3.0   Thyroid function studies No results for input(s): TSH, T4TOTAL, T3FREE, THYROIDAB in the last 72 hours.  Invalid input(s): FREET3 Urinalysis    Component Value Date/Time   COLORURINE YELLOW 06/15/2021 1902   APPEARANCEUR CLEAR 06/15/2021 1902   LABSPEC 1.030 06/15/2021 1902   PHURINE 5.5 06/15/2021 1902   GLUCOSEU NEGATIVE 06/15/2021 [place] NEGATIVE 06/15/2021 [place] NEGATIVE 06/15/2021 1902   KETONESUR 15 (A) 06/15/2021 1902   PROTEINUR NEGATIVE 06/15/2021 1902   NITRITE NEGATIVE 06/15/2021 1902   LEUKOCYTESUR NEGATIVE 06/15/2021 1902    FURTHER DISCHARGE INSTRUCTIONS:   Get Medicines reviewed and adjusted: Please take all your medications with you for your next visit with your Primary MD   Laboratory/radiological data: Please request your Primary MD to go over all hospital tests and procedure/radiological results at the follow up, please ask your Primary MD to get all Hospital records sent to his/her office.   In some cases, they will be blood work, cultures and biopsy results pending at the time of your discharge. Please request that your primary care M.D. goes through all the records of your hospital data and follows up on these results.   Also Note the following: If you experience worsening of your admission symptoms, develop shortness of breath, life threatening emergency, suicidal or homicidal thoughts you must seek medical attention immediately by calling 911 or calling your MD immediately  if symptoms less severe.   You must read complete instructions/literature along with all the possible adverse reactions/side effects for all the Medicines you take and that have been prescribed to you. Take any new Medicines after you have completely understood and accpet all the possible adverse reactions/side effects.    Do not drive when taking Pain medications or sleeping medications (Benzodaizepines)    Do not take more than prescribed Pain, Sleep and Anxiety Medications. It is not advisable to combine anxiety,sleep and pain medications without talking with your primary care practitioner   Special Instructions: If you have smoked or chewed Tobacco  in the last 2 yrs please stop smoking, stop any regular Alcohol  and or any Recreational drug use.   Wear Seat belts while driving.   Please note: You were cared for by a hospitalist during your hospital stay. Once you are discharged, your primary care physician will handle any further medical issues. Please note that NO REFILLS for any discharge medications will be authorized once you are discharged, as it is imperative that you return to your primary care physician (or establish a relationship with a primary care physician if you do not have one) for your post hospital discharge needs so that they can reassess your need for medications and monitor your lab values.  Time coordinating discharge: 40 minutes  SIGNED:  Marzetta Board, MD, PhD 06/19/2021, 2:49 PM

## 2021-06-19 NOTE — Progress Notes (Signed)
Occupational Therapy Treatment Patient Details Name: LEOTHA VOELTZ MRN: [id number] DOB: 10-07-1940 Today's Date: 06/19/2021   History of present illness GRIFF BADLEY is a 80 y.o. male with medical history significant of HTN, OA, depression and insomnia and ? Spinal stenosis that presented  to ED for left sided weakness. 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   OT comments  Pt making progress with OT goals this session. He presented with increased activity tolerance and strength, however continues to demonstrate impulsivity, decreased safety/deficit awareness, and poor balance. Pt requiring min guard to mod A for all ADL's and functional mobility. He worked on transfers this session and maintaining his balance as he comes to standing. OT will continue to follow acutely. D/C recommendations updated to SNF level therapies.    Recommendations for follow up therapy are one component of a multi-disciplinary discharge planning process, led by the attending physician.  Recommendations may be updated based on patient status, additional functional criteria and insurance authorization.    Follow Up Recommendations  Skilled nursing-short term rehab (<3 hours/day)    Assistance Recommended at Discharge Frequent or constant Supervision/Assistance  Equipment Recommendations  Other (comment) (RW)    Recommendations for Other Services      Precautions / Restrictions Precautions Precautions: Fall Precaution Comments: L sided weakness Restrictions Weight Bearing Restrictions: No       Mobility Bed Mobility               General bed mobility comments: Pt received and left in chair    Transfers Overall transfer level: Needs assistance Equipment used: Rolling walker (2 wheels) Transfers: Sit to/from Stand;Bed to chair/wheelchair/BSC Sit to Stand: Min assist;Mod assist Stand pivot transfers: Mod assist         General transfer comment: Pt impulsively stood before therapist cued, max  cues for safety, up top modA for steadying upon standing     Balance Overall balance assessment: Needs assistance Sitting-balance support: Feet supported;Single extremity supported Sitting balance-Leahy Scale: Fair     Standing balance support: Bilateral upper extremity supported;During functional activity;Reliant on assistive device for balance Standing balance-Leahy Scale: Poor Standing balance comment: dependent on UEs/RW, 1 LOB posterior/left with modA to correct                           ADL either performed or assessed with clinical judgement   ADL Overall ADL's : Needs assistance/impaired     Grooming: Wash/dry hands;Wash/dry face;Oral care;Minimal assistance;Standing Grooming Details (indicate cue type and reason): Min A due to poor balance at sink                 Toilet Transfer: Minimal assistance;Ambulation Toilet Transfer Details (indicate cue type and reason): Pt able to ambulate with min guard to bathroom, requiring min/mod A x3 due to loss of balance Toileting- Clothing Manipulation and Hygiene: Min guard;Minimal assistance;Sitting/lateral lean;Sit to/from stand Toileting - Clothing Manipulation Details (indicate cue type and reason): Min g to min A when completing clothing management in standing     Functional mobility during ADLs: Minimal assistance;Moderate assistance;Rolling walker (2 wheels) General ADL Comments: Pt with increased impulsivity this session and poor balance, requiring ming-mod A for all ADL's and functional mobility    Extremity/Trunk Assessment              Vision   Vision Assessment?: No apparent visual deficits   Perception     Praxis      Cognition Arousal/Alertness:  Awake/alert Behavior During Therapy: Impulsive Overall Cognitive Status: Impaired/Different from baseline Area of Impairment: Safety/judgement;Problem solving                         Safety/Judgement: Decreased awareness of  safety;Decreased awareness of deficits   Problem Solving: Requires verbal cues;Requires tactile cues;Difficulty sequencing General Comments: pt quick to move/impulsive requiring max verbal cues and minA-modA to prevent fall, pt with decreased insight to many of his deficts and the level of assist he needs at this time          Exercises Exercises: Other exercises Other Exercises Other Exercises: Seated: LAQ, ankle pumps x10; marching x2 for teach back, heel raise/toe raise x2 for teach back   Shoulder Instructions       General Comments Educated on "nose over toes" to stand and increase balance.    Pertinent Vitals/ Pain          Home Living                                          Prior Functioning/Environment              Frequency  Min 2X/week        Progress Toward Goals  OT Goals(current goals can now be found in the care plan section)  Progress towards OT goals: Progressing toward goals  Acute Rehab OT Goals Patient Stated Goal: To get stronger OT Goal Formulation: With patient Time For Goal Achievement: 07/01/21 Potential to Achieve Goals: Good ADL Goals Pt Will Perform Grooming: with modified independence;standing Pt Will Perform Lower Body Bathing: with modified independence;sitting/lateral leans;sit to/from stand Pt Will Perform Lower Body Dressing: with modified independence;sitting/lateral leans;sit to/from stand Pt Will Transfer to Toilet: with modified independence;ambulating Pt Will Perform Toileting - Clothing Manipulation and hygiene: with modified independence;sitting/lateral leans;sit to/from stand Additional ADL Goal #1: Pt will complete medication management task with no errors. Additional ADL Goal #2: Pt will complete dynamic standing task with min guard assist as precursor for ADL's  Plan Discharge plan needs to be updated;Frequency remains appropriate    Co-evaluation    PT/OT/SLP Co-Evaluation/Treatment:  Yes Reason for Co-Treatment: For patient/therapist safety;To address functional/ADL transfers PT goals addressed during session: Mobility/safety with mobility;Balance;Proper use of DME;Strengthening/ROM OT goals addressed during session: ADL's and self-care;Proper use of Adaptive equipment and DME      AM-PAC OT "6 Clicks" Daily Activity     Outcome Measure   Help from another person eating meals?: A Little Help from another person taking care of personal grooming?: A Little Help from another person toileting, which includes using toliet, bedpan, or urinal?: A Lot Help from another person bathing (including washing, rinsing, drying)?: A Lot Help from another person to put on and taking off regular upper body clothing?: A Little Help from another person to put on and taking off regular lower body clothing?: A Lot 6 Click Score: 15    End of Session Equipment Utilized During Treatment: Gait belt;Rolling walker (2 wheels)  OT Visit Diagnosis: Unsteadiness on feet (R26.81);Other abnormalities of gait and mobility (R26.89);Muscle weakness (generalized) (M62.81)   Activity Tolerance Patient tolerated treatment well   Patient Left in chair;with call bell/phone within reach;with chair alarm set   Nurse Communication Mobility status        Time: 0947-0962 OT Time Calculation (min): 26 min  Charges:  OT General Charges $OT Visit: 1 Visit OT Treatments $Self Care/Home Management : 8-22 mins  Amaliya Whitelaw H., OTR/L Acute Rehabilitation  Kazuko Clemence Elane Bing Plume 06/19/2021, 3:39 PM

## 2021-06-19 NOTE — Plan of Care (Signed)
°  Problem: Education: °Goal: Knowledge of General Education information will improve °Description: Including pain rating scale, medication(s)/side effects and non-pharmacologic comfort measures °Outcome: Progressing °  °Problem: Health Behavior/Discharge Planning: °Goal: Ability to manage health-related needs will improve °Outcome: Progressing °  °Problem: Clinical Measurements: °Goal: Ability to maintain clinical measurements within normal limits will improve °Outcome: Progressing °Goal: Will remain free from infection °Outcome: Progressing °Goal: Diagnostic test results will improve °Outcome: Progressing °Goal: Respiratory complications will improve °Outcome: Progressing °Goal: Cardiovascular complication will be avoided °Outcome: Progressing °  °Problem: Activity: °Goal: Risk for activity intolerance will decrease °Outcome: Progressing °  °Problem: Nutrition: °Goal: Adequate nutrition will be maintained °Outcome: Progressing °  °Problem: Coping: °Goal: Level of anxiety will decrease °Outcome: Progressing °  °Problem: Elimination: °Goal: Will not experience complications related to bowel motility °Outcome: Progressing °Goal: Will not experience complications related to urinary retention °Outcome: Progressing °  °Problem: Pain Managment: °Goal: General experience of comfort will improve °Outcome: Progressing °  °Problem: Safety: °Goal: Ability to remain free from injury will improve °Outcome: Progressing °  °Problem: Skin Integrity: °Goal: Risk for impaired skin integrity will decrease °Outcome: Progressing °  °Problem: Education: °Goal: Knowledge of disease or condition will improve °Outcome: Progressing °Goal: Knowledge of secondary prevention will improve (SELECT ALL) °Outcome: Progressing °Goal: Knowledge of patient specific risk factors will improve (INDIVIDUALIZE FOR PATIENT) °Outcome: Progressing °Goal: Individualized Educational Video(s) °Outcome: Progressing °  °Problem: Coping: °Goal: Will verbalize  positive feelings about self °Outcome: Progressing °  °Problem: Health Behavior/Discharge Planning: °Goal: Ability to manage health-related needs will improve °Outcome: Progressing °  °Problem: Self-Care: °Goal: Ability to participate in self-care as condition permits will improve °Outcome: Progressing °Goal: Verbalization of feelings and concerns over difficulty with self-care will improve °Outcome: Progressing °  °Problem: Ischemic Stroke/TIA Tissue Perfusion: °Goal: Complications of ischemic stroke/TIA will be minimized °Outcome: Progressing °  °

## 2021-06-19 NOTE — Progress Notes (Deleted)
PROGRESS NOTE  GIRARD KOONTZ ELF:[id number] DOB: 25-Jan-1941 DOA: 06/15/2021 PCP: Doreen Salvage, PA-C   LOS: 3 days   Brief Narrative / Interim history: 80 year old male with HTN, OA, depression/anxiety who comes into the hospital with left-sided weakness for about 2 days prior to coming in.  He was found to have a stroke and admitted to the hospital.  Subjective / 24h Interval events: Doing well, no chest pain, no shortness of breath.  Still weak on the left side.    Assessment & Plan: Principal Problem:   Cerebral thrombosis with cerebral infarction Active Problems:   Essential hypertension   Depression   Osteoarthritis   Insomnia  Principal Problem Acute CVA within the ventral right medulla oblongata-this was noted on the MRI on admission.  There was no hemorrhagic or mass-effect.  Neurology consulted. CT head negative for acute finging, but does focal severe stenosis/occlusion of the left A2 segment with distal reconstitution. moderate stenosis of the proximal right P2 segment and severe stenosis of the left P1/P2 junction.  Lipid panel shows an LDL of 96, continue atorvastatin 40 mg.  He was not on statin prior to admission.  2D echo shows EF 60-65%, no WMA, grade I DD.  Therapy recommends CIR, however due to lack of outpatient support and now has been changed to SNF.neurology recommends dual antiplatelet therapy with aspirin 81 and Plavix 75 for 3 weeks followed by aspirin alone.    Active Problems Essential hypertension-continue amlodipine Hyperlipidemia-now on atorvastatin 40 Depression -Continue zoloft  Osteoarthritis -continue mobic. Tylenol and tramadol prn. Insomnia -Has been on xanax for over 15 years, discussed this is not a drug for insomnia, but with longevity of use will be hard to wean him off. Continue while inpatient.  Hx of spinal stenosis-Hx of trauma to his back and he recalls being told he had spinal stenosis.  73mm pulmonary nodule -F/u outpatient. If low risk,  no further screening recommended.    Body mass index is 24.01 kg/m.  Scheduled Meds:  amLODipine  10 mg Oral Daily   aspirin EC  81 mg Oral Daily   atorvastatin  40 mg Oral Daily   clopidogrel  75 mg Oral Daily   enoxaparin (LOVENOX) injection  40 mg Subcutaneous Q24H   meloxicam  15 mg Oral Daily   sertraline  100 mg Oral QHS   Continuous Infusions: PRN Meds:.acetaminophen **OR** acetaminophen (TYLENOL) oral liquid 160 mg/5 mL **OR** acetaminophen, alprazolam, hydrALAZINE, senna-docusate, traMADol  Diet Orders (From admission, onward)     Start     Ordered   06/18/21 1601  Diet Heart Room service appropriate? Yes; Fluid consistency: Thin  Diet effective now       Comments: Pt request no tea or milk please  Question Answer Comment  Room service appropriate? Yes   Fluid consistency: Thin      06/18/21 1600            DVT prophylaxis: enoxaparin (LOVENOX) injection 40 mg Start: 06/16/21 1915     Code Status: Full Code  Family Communication: No family at bedside  Status is: Inpatient  Remains inpatient appropriate because: Persistent weakness  Level of care: Telemetry Medical  Consultants:  Neurology   Procedures:  2D echo: pending  Microbiology  none  Antimicrobials: none    Objective: Vitals:   06/18/21 1700 06/18/21 1945 06/18/21 2327 06/19/21 0316  BP:  (!) 143/81 130/80 (!) 142/73  Pulse:   61 62  Resp:  (!) 24 14 15   Temp:  98.4 F (36.9 C) 98.2 F (36.8 C) 98 F (36.7 C) 97.6 F (36.4 C)  TempSrc: Oral Oral Oral Oral  SpO2: 99% 99% 98% 98%  Weight:      Height:       No intake or output data in the 24 hours ending 06/19/21 0956  Filed Weights   06/15/21 1333  Weight: 82.6 kg    Examination:  Constitutional: No distress, appears comfortable Neurologic: No new deficits  Data Reviewed: I have independently reviewed following labs and imaging studies   CBC: Recent Labs  Lab 06/15/21 1902 06/16/21 1715  WBC 4.4 4.3   NEUTROABS 3.7 3.6  HGB 14.4 14.6  HCT 41.7 42.9  MCV 98.8 97.9  PLT 161 173    Basic Metabolic Panel: Recent Labs  Lab 06/15/21 1902  NA 137  K 4.2  CL 101  CO2 27  GLUCOSE 119*  BUN 19  CREATININE 0.81  CALCIUM 9.5    Liver Function Tests: Recent Labs  Lab 06/15/21 1902  AST 34  ALT 34  ALKPHOS 87  BILITOT 0.4  PROT 7.4  ALBUMIN 4.5    Coagulation Profile: Recent Labs  Lab 06/15/21 1902  INR 1.1    HbA1C: Recent Labs    06/17/21 0405  HGBA1C 5.5    CBG: No results for input(s): GLUCAP in the last 168 hours.  Recent Results (from the past 240 hour(s))  Resp Panel by RT-PCR (Flu A&B, Covid) Nasopharyngeal Swab     Status: None   Collection Time: 06/15/21  7:02 PM   Specimen: Nasopharyngeal Swab; Nasopharyngeal(NP) swabs in vial transport medium  Result Value Ref Range Status   SARS Coronavirus 2 by RT PCR NEGATIVE NEGATIVE Final    Comment: (NOTE) SARS-CoV-2 target nucleic acids are NOT DETECTED.  The SARS-CoV-2 RNA is generally detectable in upper respiratory specimens during the acute phase of infection. The lowest concentration of SARS-CoV-2 viral copies this assay can detect is 138 copies/mL. A negative result does not preclude SARS-Cov-2 infection and should not be used as the sole basis for treatment or other patient management decisions. A negative result may occur with  improper specimen collection/handling, submission of specimen other than nasopharyngeal swab, presence of viral mutation(s) within the areas targeted by this assay, and inadequate number of viral copies(<138 copies/mL). A negative result must be combined with clinical observations, patient history, and epidemiological information. The expected result is Negative.  Fact Sheet for Patients:  BloggerCourse.com  Fact Sheet for Healthcare Providers:  SeriousBroker.it  This test is no t yet approved or cleared by the  [place] FDA and  has been authorized for detection and/or diagnosis of SARS-CoV-2 by FDA under an Emergency Use Authorization (EUA). This EUA will remain  in effect (meaning this test can be used) for the duration of the COVID-19 declaration under Section 564(b)(1) of the Act, 21 U.S.C.section 360bbb-3(b)(1), unless the authorization is terminated  or revoked sooner.       Influenza A by PCR NEGATIVE NEGATIVE Final   Influenza B by PCR NEGATIVE NEGATIVE Final    Comment: (NOTE) The Xpert Xpress SARS-CoV-2/FLU/RSV plus assay is intended as an aid in the diagnosis of influenza from Nasopharyngeal swab specimens and should not be used as a sole basis for treatment. Nasal washings and aspirates are unacceptable for Xpert Xpress SARS-CoV-2/FLU/RSV testing.  Fact Sheet for Patients: BloggerCourse.com  Fact Sheet for Healthcare Providers: SeriousBroker.it  This test is not yet approved or cleared by the [place] FDA  and has been authorized for detection and/or diagnosis of SARS-CoV-2 by FDA under an Emergency Use Authorization (EUA). This EUA will remain in effect (meaning this test can be used) for the duration of the COVID-19 declaration under Section 564(b)(1) of the Act, 21 U.S.C. section 360bbb-3(b)(1), unless the authorization is terminated or revoked.  Performed at [facility], [address] [place], [place] [address]       Radiology Studies: No results found.   Pamella Pert, MD, PhD [facility]  Between 7 am - 7 pm I am available, please contact me via Amion (for emergencies) or Securechat (non urgent messages)  Between 7 pm - 7 am I am not available, please contact night coverage MD/APP via Amion

## 2021-06-19 NOTE — TOC Transition Note (Signed)
Transition of Care [facility]) - CM/SW Discharge Note   Patient Details  Name: Charles Hale MRN: [id number] Date of Birth: March 21, 1941  [facility]) CM/SW Contact:  Kermit Balo, RN Phone Number: 06/19/2021, 3:04 PM   Clinical Narrative:    Patient is discharging to [facility] today. Son is going to provide the transport. Bedside RN updated and dc packet is at the desk.   Number for report: [phone]   Final next level of care: Skilled Nursing Facility Barriers to Discharge: No Barriers Identified   Patient Goals and CMS Choice   CMS Medicare.gov Compare Post Acute Care list provided to:: Patient Choice offered to / list presented to : Patient  Discharge Placement PASRR number recieved: 06/17/21            Patient chooses bed at: [facility] Patient to be transferred to facility by: son--Charles Hale Name of family member notified: son--Charles Hale Patient and family notified of of transfer: 06/19/21  Discharge Plan and Services In-house Referral: Clinical Social Work Discharge Planning Services: [employer] Consult Post Acute Care Choice: Skilled Nursing Facility                               Social Determinants of Health (SDOH) Interventions     Readmission Risk Interventions No flowsheet data found.

## 2021-06-19 NOTE — Progress Notes (Addendum)
Physical Therapy Treatment Patient Details Name: Charles Hale MRN: [id number] DOB: 30-Mar-1941 Today's Date: 06/19/2021   History of Present Illness Charles Hale is a 80 y.o. male with medical history significant of HTN, OA, depression and insomnia and ? Spinal stenosis that presented  to ED for left sided weakness. 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    PT Comments    Pt received sitting in chair and agreeable to therapy session. Pt remains impulsive, standing before therapist cued to do so. Pt demonstrated 1 LOB this session when standing with unilateral UE support requiring modA to recover. Pt progress distance with household gait task, emphasis on foot clearance due to L foot drop and upright posture. Spoke with supervising PT, Doreene Nest., about updating DC recommendation to SNF and family in agreement upon chart review. Pt continues to benefit from PT services to progress toward functional mobility goals.      Recommendations for follow up therapy are one component of a multi-disciplinary discharge planning process, led by the attending physician.  Recommendations may be updated based on patient status, additional functional criteria and insurance authorization.  Follow Up Recommendations  Skilled nursing-short term rehab (<3 hours/day)     Assistance Recommended at Discharge Intermittent Supervision/Assistance  Equipment Recommendations  Rolling walker (2 wheels)    Recommendations for Other Services       Precautions / Restrictions Precautions Precautions: Fall Precaution Comments: L sided weakness Restrictions Weight Bearing Restrictions: No     Mobility  Bed Mobility  General bed mobility comments: Pt received and left in chair    Transfers Overall transfer level: Needs assistance Equipment used: Rolling walker (2 wheels) Transfers: Sit to/from Stand;Bed to chair/wheelchair/BSC Sit to Stand: Min assist;Mod assist Stand pivot transfers: Mod assist   General  transfer comment: Pt impulsively stood before therapist cued, max cues for safety, up top modA for steadying upon standing    Ambulation/Gait Ambulation/Gait assistance: Mod assist;+2 safety/equipment (chair follow) Gait Distance (Feet): 75 Feet Assistive device: Rolling walker (2 wheels) Gait Pattern/deviations: Decreased step length - left;Decreased stance time - left;Decreased dorsiflexion - left;Narrow base of support;Step-through pattern Gait velocity: decreased     General Gait Details: pt with foot drop on the L causing pt to supinate late in swing phase, pt also with adduction and 1 episode of scissoring while turning, modA for walker management  Modified Rankin (Stroke Patients Only) Modified Rankin (Stroke Patients Only) Pre-Morbid Rankin Score: No significant disability Modified Rankin: Moderately severe disability     Balance Overall balance assessment: Needs assistance Sitting-balance support: Feet supported;Single extremity supported Sitting balance-Leahy Scale: Fair     Standing balance support: Bilateral upper extremity supported;During functional activity;Reliant on assistive device for balance Standing balance-Leahy Scale: Poor Standing balance comment: dependent on UEs/RW, 1 LOB posterior/left with modA to correct       Cognition Arousal/Alertness: Awake/alert Behavior During Therapy: Impulsive Overall Cognitive Status: Impaired/Different from baseline Area of Impairment: Safety/judgement;Problem solving  Safety/Judgement: Decreased awareness of safety;Decreased awareness of deficits   Problem Solving: Requires verbal cues;Requires tactile cues;Difficulty sequencing General Comments: pt quick to move/impulsive requiring max verbal cues and minA-modA to prevent fall, pt with decreased insight to many of his deficts and the level of assist he needs at this time        Exercises Other Exercises Other Exercises: Seated: LAQ, ankle pumps x10; marching x2 for  teach back, heel raise/toe raise x2 for teach back    General Comments General comments (skin integrity, edema,  etc.): Pt given HEP: [email or web address] Access Code: [email or web address]      PT Goals (current goals can now be found in the care plan section) Acute Rehab PT Goals Patient Stated Goal: home PT Goal Formulation: With patient Time For Goal Achievement: 07/01/21 Progress towards PT goals: Progressing toward goals    Frequency    Min 4X/week      PT Plan Discharge plan needs to be updated    Co-evaluation PT/OT/SLP Co-Evaluation/Treatment: Yes Reason for Co-Treatment: For patient/therapist safety;To address functional/ADL transfers PT goals addressed during session: Mobility/safety with mobility;Balance;Proper use of DME;Strengthening/ROM       AM-PAC PT "6 Clicks" Mobility   Outcome Measure  Help needed turning from your back to your side while in a flat bed without using bedrails?: A Little Help needed moving from lying on your back to sitting on the side of a flat bed without using bedrails?: A Little Help needed moving to and from a bed to a chair (including a wheelchair)?: A Lot Help needed standing up from a chair using your arms (e.g., wheelchair or bedside chair)?: A Lot Help needed to walk in hospital room?: A Lot Help needed climbing 3-5 steps with a railing? : A Lot 6 Click Score: 14    End of Session Equipment Utilized During Treatment: Gait belt Activity Tolerance: Patient tolerated treatment well Patient left: in chair;with call bell/phone within reach;with chair alarm set (with OT) Nurse Communication: Mobility status PT Visit Diagnosis: Unsteadiness on feet (R26.81);Muscle weakness (generalized) (M62.81);Difficulty in walking, not elsewhere classified (R26.2)     Time: 0177-9390 PT Time Calculation (min) (ACUTE ONLY): 28 min  Charges:  $Gait Training: 8-22 mins                     Harland German, Student PTA CI: Carly P., PTA  Carly M  Poff 06/19/2021, 3:15 PM

## 2021-06-19 NOTE — TOC Progression Note (Signed)
Transition of Care [facility]) - Progression Note    Patient Details  Name: Charles Hale MRN: [id number] Date of Birth: 04/12/1941  [facility]) CM/SW Contact  Kermit Balo, RN Phone Number: 06/19/2021, 11:17 AM  Clinical Narrative:    Son wanted [facility] [place] but patient asked for [facility] in [place]. They dont have a bed available. Pt then picked [facility]. Lowella Bandy has accepted. CM has started insurance through [facility] and asked for a rush.  TOC following.   Expected Discharge Plan: Skilled Nursing Facility Barriers to Discharge: Continued Medical Work up  Expected Discharge Plan and Services Expected Discharge Plan: Skilled Nursing Facility In-house Referral: Clinical Social Work Discharge Planning Services: CM Consult Post Acute Care Choice: Skilled Nursing Facility Living arrangements for the past 2 months: Single Family Home                                       Social Determinants of Health (SDOH) Interventions    Readmission Risk Interventions No flowsheet data found.

## 2021-06-19 NOTE — Progress Notes (Signed)
Report called to [facility], Johney Maine RN. All questions answered. Son Julian Reil to pick up about 5 pm and transport.

## 2021-06-22 DIAGNOSIS — I69354 Hemiplegia and hemiparesis following cerebral infarction affecting left non-dominant side: Secondary | ICD-10-CM | POA: Diagnosis not present

## 2021-06-22 DIAGNOSIS — I10 Essential (primary) hypertension: Secondary | ICD-10-CM | POA: Diagnosis not present

## 2021-06-22 DIAGNOSIS — R5381 Other malaise: Secondary | ICD-10-CM | POA: Diagnosis not present

## 2021-06-22 DIAGNOSIS — E785 Hyperlipidemia, unspecified: Secondary | ICD-10-CM | POA: Diagnosis not present

## 2021-06-25 DIAGNOSIS — F339 Major depressive disorder, recurrent, unspecified: Secondary | ICD-10-CM | POA: Diagnosis not present

## 2021-06-25 DIAGNOSIS — I69354 Hemiplegia and hemiparesis following cerebral infarction affecting left non-dominant side: Secondary | ICD-10-CM | POA: Diagnosis not present

## 2021-06-25 DIAGNOSIS — M1990 Unspecified osteoarthritis, unspecified site: Secondary | ICD-10-CM | POA: Diagnosis not present

## 2021-06-25 DIAGNOSIS — I10 Essential (primary) hypertension: Secondary | ICD-10-CM | POA: Diagnosis not present

## 2021-06-25 DIAGNOSIS — E785 Hyperlipidemia, unspecified: Secondary | ICD-10-CM | POA: Diagnosis not present

## 2021-06-29 DIAGNOSIS — G8929 Other chronic pain: Secondary | ICD-10-CM | POA: Diagnosis not present

## 2021-06-29 DIAGNOSIS — M545 Low back pain, unspecified: Secondary | ICD-10-CM | POA: Diagnosis not present

## 2021-06-29 DIAGNOSIS — I63329 Cerebral infarction due to thrombosis of unspecified anterior cerebral artery: Secondary | ICD-10-CM | POA: Diagnosis not present

## 2021-06-29 DIAGNOSIS — M79671 Pain in right foot: Secondary | ICD-10-CM | POA: Diagnosis not present

## 2021-06-29 DIAGNOSIS — Z7689 Persons encountering health services in other specified circumstances: Secondary | ICD-10-CM | POA: Diagnosis not present

## 2021-08-03 ENCOUNTER — Other Ambulatory Visit (HOSPITAL_COMMUNITY): Payer: Self-pay

## 2021-08-07 ENCOUNTER — Other Ambulatory Visit (HOSPITAL_COMMUNITY): Payer: Self-pay

## 2021-08-07 MED ORDER — GABAPENTIN 100 MG PO CAPS
100.0000 mg | ORAL_CAPSULE | Freq: Three times a day (TID) | ORAL | 3 refills | Status: DC
  Filled 2021-08-07: qty 270, 90d supply, fill #0

## 2021-08-07 MED ORDER — SERTRALINE HCL 100 MG PO TABS
100.0000 mg | ORAL_TABLET | Freq: Every day | ORAL | 1 refills | Status: DC
  Filled 2021-08-07: qty 90, 90d supply, fill #0
  Filled 2022-01-01: qty 90, 90d supply, fill #1

## 2021-08-07 MED ORDER — ALPRAZOLAM 2 MG PO TABS
2.0000 mg | ORAL_TABLET | Freq: Every evening | ORAL | 1 refills | Status: DC | PRN
  Filled 2021-08-07 – 2021-08-18 (×3): qty 30, 30d supply, fill #0
  Filled 2021-09-14: qty 30, 30d supply, fill #1

## 2021-08-07 MED ORDER — ATORVASTATIN CALCIUM 40 MG PO TABS
40.0000 mg | ORAL_TABLET | Freq: Every day | ORAL | 1 refills | Status: DC
  Filled 2021-08-07: qty 90, 90d supply, fill #0

## 2021-08-07 MED ORDER — AMLODIPINE BESYLATE 10 MG PO TABS
10.0000 mg | ORAL_TABLET | Freq: Every day | ORAL | 3 refills | Status: DC
  Filled 2021-08-07: qty 90, 90d supply, fill #0

## 2021-08-08 ENCOUNTER — Other Ambulatory Visit (HOSPITAL_COMMUNITY): Payer: Self-pay

## 2021-08-13 ENCOUNTER — Other Ambulatory Visit (HOSPITAL_COMMUNITY): Payer: Self-pay

## 2021-08-13 ENCOUNTER — Inpatient Hospital Stay: Payer: Self-pay | Admitting: Adult Health

## 2021-08-17 ENCOUNTER — Other Ambulatory Visit (HOSPITAL_COMMUNITY): Payer: Self-pay

## 2021-08-18 ENCOUNTER — Other Ambulatory Visit (HOSPITAL_COMMUNITY): Payer: Self-pay

## 2021-08-20 ENCOUNTER — Other Ambulatory Visit (HOSPITAL_COMMUNITY): Payer: Self-pay

## 2021-08-27 ENCOUNTER — Other Ambulatory Visit (HOSPITAL_COMMUNITY): Payer: Self-pay

## 2021-08-28 ENCOUNTER — Ambulatory Visit: Payer: PPO | Attending: Internal Medicine | Admitting: Physical Therapy

## 2021-08-28 ENCOUNTER — Encounter: Payer: Self-pay | Admitting: Physical Therapy

## 2021-08-28 ENCOUNTER — Other Ambulatory Visit: Payer: Self-pay

## 2021-08-28 DIAGNOSIS — M6281 Muscle weakness (generalized): Secondary | ICD-10-CM | POA: Insufficient documentation

## 2021-08-28 DIAGNOSIS — R262 Difficulty in walking, not elsewhere classified: Secondary | ICD-10-CM | POA: Diagnosis present

## 2021-08-28 DIAGNOSIS — R2681 Unsteadiness on feet: Secondary | ICD-10-CM | POA: Insufficient documentation

## 2021-08-28 NOTE — Therapy (Signed)
[facility] Outpatient Rehabilitation [facility] [address]  [address] [place], [place], [address] Phone: [phone]   Fax:  [phone]  Physical Therapy Evaluation  Patient Details  Name: Charles Hale MRN: [id number] Date of Birth: 01-19-1941 Referring Provider (PT): Doreen Salvage PA-C   Encounter Date: 08/28/2021   PT End of Session - 08/28/21 1213     Visit Number 1    Number of Visits 24    Date for PT Re-Evaluation 11/20/21    Authorization Type HT advantage    Progress Note Due on Visit 10    PT Start Time 1101    PT Stop Time 1150    PT Time Calculation (min) 49 min    Activity Tolerance Patient tolerated treatment well    Behavior During Therapy [facility] for tasks assessed/performed             Past Medical History:  Diagnosis Date   Anxiety    Arthritis    Hypertension    Spinal stenosis     Past Surgical History:  Procedure Laterality Date   SHOULDER ARTHROSCOPY      There were no vitals filed for this visit.    Subjective Assessment - 08/28/21 1116     Subjective Pt. reports he had a stroke during the night on 08/14/21, but had delay in care due to lack of hospital beds in transition from ED, did not have MRI until 08/16/21 which demonstrated CVA in R medulla.  He was discharged to SNF where he reported substandard care, only getting 2 hours of PT total during 7 days, and having foot infected.  He was discharged to HHPT where he reports they were short-staffed and he felt someone different was seeing each time.  He reports he has been doing his HEP, just feels very frustrated that his balance is bad and he cannot do the things he likes, which includes wooddworking (shop in basement, 14 steps hard to get downstairs), and exercise.    Pertinent History history chronic LBP, stenosis, L ankle fracture, L ankle severe sprain, OA R knee    Diagnostic tests 08/16/21- IMPRESSION:  1. Small focus of acute ischemia within the ventral right  medulla  oblongata. No hemorrhage or mass effect.  2. Advanced atrophy and chronic ischemic microangiopathy.    Patient Stated Goals "walk"    Currently in Pain? No/denies                [facility] PT Assessment - 08/28/21 0001       Assessment   Medical Diagnosis I63.329 (ICD-10-CM) - Cerebral infarction due to thrombosis of unspecified anterior cerebral artery    Referring Provider (PT) Doreen Salvage PA-C    Onset Date/Surgical Date 08/14/21    Hand Dominance Right    Prior Therapy SNF & HHPT      Precautions   Precautions Fall      Restrictions   Weight Bearing Restrictions No      Balance Screen   Has the patient fallen in the past 6 months Yes    How many times? 1    Has the patient had a decrease in activity level because of a fear of falling?  Yes    Is the patient reluctant to leave their home because of a fear of falling?  Yes      Home Environment   Living Environment Private residence    Living Arrangements Alone    Type of Home House  Home Access Stairs to enter    Entrance Stairs-Number of Steps 2    Entrance Stairs-Rails --   has strap to grab entering from garage   Home Layout Multi-level;Able to live on main level with bedroom/bathroom    Alternate Level Stairs-Number of Steps 14    Alternate Level Stairs-Rails Right;Left   switches side, starts on one side then landing onto another side   Home Equipment Walker - 2 wheels;Cane - single point;Toilet riser;Shower seat;Grab bars - tub/shower      Prior Function   Level of Independence Independent    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  Predicted outcome 62% after 17 visits.      Posture/Postural Control   Posture/Postural Control Postural  limitations    Postural Limitations Rounded Shoulders;Forward head;Decreased lumbar lordosis;Weight shift right      ROM / Strength   AROM / PROM / Strength Strength      Strength   Overall Strength Deficits    Overall Strength Comments tested in sitting    Strength Assessment Site Hip;Knee;Ankle    Right/Left Hip Right;Left    Right Hip Flexion 5/5    Right Hip ABduction 5/5    Right Hip ADduction 5/5    Left Hip Flexion 4+/5    Left Hip ABduction 5/5    Left Hip ADduction 5/5    Right/Left Knee Right;Left    Right Knee Flexion 5/5    Right Knee Extension 5/5    Left Knee Flexion 4+/5    Left Knee Extension 5/5    Right/Left Ankle Right;Left    Right Ankle Dorsiflexion 5/5    Right Ankle Plantar Flexion 5/5    Right Ankle Inversion 5/5    Right Ankle Eversion 5/5    Left Ankle Dorsiflexion 4/5    Left Ankle Plantar Flexion 4/5    Left Ankle Inversion 4/5    Left Ankle Eversion 1/5      Ambulation/Gait   Ambulation/Gait Yes    Ambulation/Gait Assistance 5: Supervision    Ambulation/Gait Assistance Details SBA, unsteady, gait deviaitons    Ambulation Distance (Feet) 50 Feet    Assistive device Straight cane    Gait Pattern Step-through pattern;Decreased weight shift to left;Lateral trunk lean to right;Wide base of support      Balance   Balance Assessed Yes      Static Standing Balance   Static Standing - Balance Support No upper extremity supported    Static Standing - Level of Assistance 5: Stand by assistance    Static Standing Balance -  Activities  Single Leg Stance - Right Leg;Single Leg Stance - Left Leg;Tandam Stance - Right Leg;Tandam Stance - Left Leg;Romberg - Eyes Opened    Static Standing - Comment/# of Minutes able to stand 30 seconds without assist, unable to get into tandem stance, SLS x 3 sec R, O sec L      Standardized Balance Assessment   Standardized Balance Assessment Timed Up and Go Test;Five Times Sit to Stand    Five times sit to stand  comments  UE assist 13.5 sec      Timed Up and Go Test   TUG Normal TUG  Normal TUG (seconds) 20   with SPC                       Objective measurements completed on examination: See above findings.                PT Education - 08/28/21 1228     Education Details education on safety, continue to perform current HEP    Person(s) Educated Patient    Methods Explanation    Comprehension Verbalized understanding              PT Short Term Goals - 08/28/21 1220       PT SHORT TERM GOAL #1   Title Ind. with initial HEP    Baseline has HEP from HHPT    Time 2    Period Weeks    Status New    Target Date 09/11/21      PT SHORT TERM GOAL #2   Title Pt. will demonstrate improved midline orientation with no weight shift to R with standing, transfers, or walking.    Baseline weight shifts and leans to R    Time 6    Period Weeks    Status New    Target Date 10/09/21      PT SHORT TERM GOAL #3   Title Pt. will demonstrate improved L ankle eversion strength to 3/5 for safety in gait    Baseline 1/5 L ankle, rolls, using brace    Time 6    Period Weeks    Status New    Target Date 10/09/21               PT Long Term Goals - 08/28/21 1221       PT LONG TERM GOAL #1   Title Pt. will be independent with progressed HEP to improve outcomes.    Time 12    Period Weeks    Status New    Target Date 11/20/21      PT LONG TERM GOAL #2   Title Pt. will be able to ambulate 1000' safely without AD for safety with community ambulation.    Time 12    Period Weeks    Status New    Target Date 11/20/21      PT LONG TERM GOAL #3   Title Pt. will be able to ascend/descend 12 stairs safely with 1 HR to access home.    Time 12    Period Weeks    Status New    Target Date 11/20/21      PT LONG TERM GOAL #4   Title Pt. will be able to maintain tandem stance x 30 sec bil to demonstrate improved balance.    Baseline unable to maintain tandem  stance    Time 12    Period Weeks    Status New    Target Date 11/20/21      PT LONG TERM GOAL #5   Title Pt. will score at least 62% on FOTO to demonstrate improved functional mobility    Baseline 53% (47% disability)    Time 12    Period Weeks    Status New    Target Date 11/20/21                    Plan - 08/28/21 1214     Clinical Impression Statement Mr. 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 He presents with sigificant impairments in gait and balance and weakness in L ankle eversion, resulting in increased risk of falls with injury.  On FOTO he reports 47% disability.  He was not able to maintain tandem stance or single leg stance.  He also demonstrates poor midline orientation and weight shift to R.  He would benefit from skilled physical therapy to improve balance, gait, and safety in order to be able to return to desired activities.    Personal Factors and Comorbidities Comorbidity 3+    Comorbidities knee OA, history L ankle injuries, spinal stenosis, HTN    Examination-Activity Limitations Bend;Carry;Lift;Locomotion Level;Transfers;Stairs;Stand    Examination-Participation Restrictions Cleaning;Meal Prep;Yard Work;Community Activity;Laundry;Shop    Stability/Clinical Decision Making Evolving/Moderate complexity    Clinical Decision Making Moderate    Rehab Potential Good    PT Frequency 2x / week    PT Duration 12 weeks    PT Treatment/Interventions ADLs/Self Care Home Management;Moist Heat;Cryotherapy;Gait training;Stair training;Functional mobility training;Therapeutic activities;Therapeutic exercise;Balance training;Neuromuscular re-education;Patient/family education;Manual techniques    PT Next Visit Plan standing corner balance exercises    Consulted and Agree with Plan of Care Patient             Patient will benefit from skilled therapeutic intervention in order to improve the  following deficits and impairments:  Abnormal gait, Decreased endurance, Decreased strength, Decreased balance, Decreased mobility, Decreased safety awareness, Difficulty walking, Improper body mechanics, Impaired vision/preception  Visit Diagnosis: Unsteadiness on feet  Difficulty in walking, not elsewhere classified  Muscle weakness (generalized)     Problem List Patient Active Problem List   Diagnosis Date Noted   Cerebral thrombosis with cerebral infarction 06/17/2021   Essential hypertension 06/16/2021   Depression 06/16/2021   Osteoarthritis 06/16/2021   Insomnia 06/16/2021   Acute left-sided weakness 06/15/2021    Jena Gauss, PT, DPT  08/28/2021, 12:28 PM  [facility] Outpatient Rehabilitation [facility] [address]  [address] [place], [place], [address] Phone: [phone]   Fax:  [phone]  Name: CAMAREN TAI MRN: [id number] Date of Birth: [birth year]

## 2021-09-01 ENCOUNTER — Encounter: Payer: Self-pay | Admitting: Physical Therapy

## 2021-09-01 ENCOUNTER — Other Ambulatory Visit: Payer: Self-pay

## 2021-09-01 ENCOUNTER — Ambulatory Visit: Payer: PPO | Admitting: Physical Therapy

## 2021-09-01 DIAGNOSIS — M6281 Muscle weakness (generalized): Secondary | ICD-10-CM

## 2021-09-01 DIAGNOSIS — R2681 Unsteadiness on feet: Secondary | ICD-10-CM | POA: Diagnosis not present

## 2021-09-01 DIAGNOSIS — R262 Difficulty in walking, not elsewhere classified: Secondary | ICD-10-CM

## 2021-09-01 NOTE — Therapy (Signed)
[facility] Outpatient Rehabilitation [facility] [address]  [address] [place], [place], [address] Phone: [phone]   Fax:  [phone]  Physical Therapy Treatment  Patient Details  Name: Charles Hale MRN: [id number] Date of Birth: [birth year] Referring Provider (PT): Doreen Salvage PA-C   Encounter Date: 09/01/2021   PT End of Session - 09/01/21 1319     Visit Number 2    Number of Visits 24    Date for PT Re-Evaluation 11/20/21    Authorization Type HT advantage    Progress Note Due on Visit 10    PT Start Time 1316    PT Stop Time 1400    PT Time Calculation (min) 44 min    Equipment Utilized During Treatment Gait belt    Activity Tolerance Patient tolerated treatment well    Behavior During Therapy WFL for tasks assessed/performed             Past Medical History:  Diagnosis Date   Anxiety    Arthritis    Hypertension    Spinal stenosis     Past Surgical History:  Procedure Laterality Date   SHOULDER ARTHROSCOPY      There were no vitals filed for this visit.   Subjective Assessment - 09/01/21 1318     Subjective Pt. reports he put his trashcan out for the first time and has a long driveway.  Denies falls.    Pertinent History history chronic LBP, stenosis, L ankle fracture, L ankle severe sprain, OA R knee    Diagnostic tests 08/16/21- IMPRESSION:  1. Small focus of acute ischemia within the ventral right medulla  oblongata. No hemorrhage or mass effect.  2. Advanced atrophy and chronic ischemic microangiopathy.    Patient Stated Goals "walk"    Currently in Pain? No/denies                [facility] PT Assessment - 09/01/21 0001       Strength   Left Ankle Dorsiflexion 4+/5    Left Ankle Eversion 2+/5      Ambulation/Gait   Ambulation/Gait Yes    Ambulation/Gait Assistance 5: Supervision    Ambulation/Gait Assistance Details SBA for safety, cues for posture    Ambulation Distance (Feet) 75 Feet    Assistive device  Straight cane    Gait Pattern Step-to pattern                           [facility] Adult PT Treatment/Exercise - 09/01/21 0001       Neuro Re-ed    Neuro Re-ed Details  see balance exercises.      Exercises   Exercises Knee/Hip      Knee/Hip Exercises: Aerobic   Nustep L5 x 6 min      Knee/Hip Exercises: Seated   Sit to Sand 20 reps;with UE support;Other (comment)   LLE biased, mirror for feedback                Balance Exercises - 09/01/21 0001       Balance Exercises: Standing   Standing, One Foot on a Step Eyes open;Head turns;Limitations    Standing, One Foot on a Step Limitations standing with RLE on step to encourage W/S to LLE, with head turns, cues to turn head slower to avoid LOB, CGA for safety.    Stepping Strategy Anterior;Lateral;10 reps;Limitations    Stepping Strategy Limitations stepping over 1/2 foam roller 2 x  10 bil, side stepping over theraband 2 x 10 bil, CGA for safety, very challenged with side stepping.    Sit to Stand --    Sit to Stand Limitations --    Other Standing Exercises reaching outside BOS using LLE starting with reaching foward for targets on mirror, progressed to reaching for cones on floor.  CGA for safety.    Other Standing Exercises Comments toe taps onto airex 2 x 10 bil, VC and tactile cues for w/s to LLE, CGA for safety                  PT Short Term Goals - 09/01/21 1324       PT SHORT TERM GOAL #1   Title Ind. with initial HEP    Baseline has HEP from HHPT    Time 2    Period Weeks    Status On-going    Target Date 09/11/21      PT SHORT TERM GOAL #2   Title Pt. will demonstrate improved midline orientation with no weight shift to R with standing, transfers, or walking.    Baseline weight shifts and leans to R    Time 6    Period Weeks    Status On-going    Target Date 10/09/21      PT SHORT TERM GOAL #3   Title Pt. will demonstrate improved L ankle eversion strength to 3/5 for safety in  gait    Baseline 1/5 L ankle, rolls, using brace    Time 6    Period Weeks    Status On-going    Target Date 10/09/21               PT Long Term Goals - 09/01/21 1334       PT LONG TERM GOAL #1   Title Pt. will be independent with progressed HEP to improve outcomes.    Time 12    Period Weeks    Status On-going    Target Date 11/20/21      PT LONG TERM GOAL #2   Title Pt. will be able to ambulate 1000' safely without AD for safety with community ambulation.    Time 12    Period Weeks    Status On-going    Target Date 11/20/21      PT LONG TERM GOAL #3   Title Pt. will be able to ascend/descend 12 stairs safely with 1 HR to access home.    Time 12    Period Weeks    Status On-going    Target Date 11/20/21      PT LONG TERM GOAL #4   Title Pt. will be able to maintain tandem stance x 30 sec bil to demonstrate improved balance.    Baseline unable to maintain tandem stance    Time 12    Period Weeks    Status On-going    Target Date 11/20/21      PT LONG TERM GOAL #5   Title Pt. will score at least 62% on FOTO to demonstrate improved functional mobility    Baseline 53% (47% disability)    Time 12    Period Weeks    Status On-going    Target Date 11/20/21                   Plan - 09/01/21 1457     Clinical Impression Statement Mr. Leece is already demonstrating improving L ankle strength, midline orientation, and is compliant  with HEP given previously at home.  Today focused on progressing balance.  He demonstrated good static standing balance, able to reach outside BOS with good control all directions.  He was much more challenged with dynamic balance activities, needing constant cues for weight shifting to LLE to pick up R foot (toe taps on airex, stool), and also had difficulty with coordination for sidestepping with LLE.  Needed constant CGA to minA to steady and prevent falls throughout session.   He would benefit from continued skilled therapy.     Personal Factors and Comorbidities Comorbidity 3+    Comorbidities knee OA, history L ankle injuries, spinal stenosis, HTN    Examination-Activity Limitations Bend;Carry;Lift;Locomotion Level;Transfers;Stairs;Stand    Examination-Participation Restrictions Cleaning;Meal Prep;Yard Work;Community Activity;Laundry;Shop    Stability/Clinical Decision Making Evolving/Moderate complexity    Rehab Potential Good    PT Frequency 2x / week    PT Duration 12 weeks    PT Treatment/Interventions ADLs/Self Care Home Management;Moist Heat;Cryotherapy;Gait training;Stair training;Functional mobility training;Therapeutic activities;Therapeutic exercise;Balance training;Neuromuscular re-education;Patient/family education;Manual techniques    PT Next Visit Plan standing corner balance exercises    Consulted and Agree with Plan of Care Patient             Patient will benefit from skilled therapeutic intervention in order to improve the following deficits and impairments:  Abnormal gait, Decreased endurance, Decreased strength, Decreased balance, Decreased mobility, Decreased safety awareness, Difficulty walking, Improper body mechanics, Impaired vision/preception  Visit Diagnosis: Unsteadiness on feet  Difficulty in walking, not elsewhere classified  Muscle weakness (generalized)     Problem List Patient Active Problem List   Diagnosis Date Noted   Cerebral thrombosis with cerebral infarction 06/17/2021   Essential hypertension 06/16/2021   Depression 06/16/2021   Osteoarthritis 06/16/2021   Insomnia 06/16/2021   Acute left-sided weakness 06/15/2021    Jena Gauss, PT, DPT  09/01/2021, 3:03 PM  [facility] Outpatient Rehabilitation [facility] [address]  [address] [place], [place], [address] Phone: [phone]   Fax:  [phone]  Name: DEANTHONY DIZE MRN: [id number] Date of Birth: 1941-07-12

## 2021-09-03 ENCOUNTER — Ambulatory Visit: Payer: PPO | Admitting: Physical Therapy

## 2021-09-03 ENCOUNTER — Encounter: Payer: Self-pay | Admitting: Physical Therapy

## 2021-09-03 ENCOUNTER — Other Ambulatory Visit: Payer: Self-pay

## 2021-09-03 DIAGNOSIS — R2681 Unsteadiness on feet: Secondary | ICD-10-CM

## 2021-09-03 DIAGNOSIS — R262 Difficulty in walking, not elsewhere classified: Secondary | ICD-10-CM

## 2021-09-03 DIAGNOSIS — M6281 Muscle weakness (generalized): Secondary | ICD-10-CM

## 2021-09-03 NOTE — Therapy (Signed)
[facility] [address]  [address] [place], [place], [address] Phone: [phone]   Fax:  [phone]  Physical Therapy Treatment  Patient Details  Name: Charles Hale MRN: [id number] Date of Birth: Mar 17, 1941 Referring Provider (PT): Doreen Salvage PA-C   Encounter Date: 09/03/2021   PT End of Session - 09/03/21 1315     Visit Number 3    Number of Visits 24    Date for PT Re-Evaluation 11/20/21    Authorization Type HT advantage    Progress Note Due on Visit 10    PT Start Time 1315    PT Stop Time 1400    PT Time Calculation (min) 45 min    Equipment Utilized During Treatment Gait belt    Activity Tolerance Patient tolerated treatment well    Behavior During Therapy WFL for tasks assessed/performed             Past Medical History:  Diagnosis Date   Anxiety    Arthritis    Hypertension    Spinal stenosis     Past Surgical History:  Procedure Laterality Date   SHOULDER ARTHROSCOPY      There were no vitals filed for this visit.   Subjective Assessment - 09/03/21 1317     Subjective Pt. reports doing pretty well today, no new falls.  Wearing knee brace today on R, wonders if it is too tight.    Pertinent History history chronic LBP, stenosis, L ankle fracture, L ankle severe sprain, OA R knee    Diagnostic tests 08/16/21- IMPRESSION:  1. Small focus of acute ischemia within the ventral right medulla  oblongata. No hemorrhage or mass effect.  2. Advanced atrophy and chronic ischemic microangiopathy.    Patient Stated Goals "walk"    Currently in Pain? No/denies                               [facility] Adult PT Treatment/Exercise - 09/03/21 0001       Knee/Hip Exercises: Aerobic   Nustep L6 x 6 min      Knee/Hip Exercises: Standing   Lateral Step Up Left;2 sets;10 reps;Hand Hold: 1;Step Height: 4"    Forward Step Up Left;2 sets;10 reps;Hand Hold: 2;Step Height: 4"    Step Down  Left;2 sets;10 reps;Hand Hold: 1;Step Height: 2"      Knee/Hip Exercises: Seated   Sit to Sand 20 reps;with UE support;Other (comment)      Shoulder Exercises: Prone   Other Prone Exercises Prone press-ups 2 x 10    Other Prone Exercises prone on elbows x 1 min, plank 2 x 3 sec hold                 Balance Exercises - 09/03/21 0001       Balance Exercises: Standing   Stepping Strategy Anterior;10 reps    Stepping Strategy Limitations stepping over 1/2 foam roller x 10 bil with CGA for safety    Step Ups --    Step Ups Limitations --    Sit to Stand --    Sit to Stand Limitations --    Other Standing Exercises Comments toe taps onto airex, x 10 L, 3 x 10 R, VC and tactile cues for w/s to LLE, CGA for safety, progressed to 2 layers airex, mirror for feed back  PT Short Term Goals - 09/01/21 1324       PT SHORT TERM GOAL #1   Title Ind. with initial HEP    Baseline has HEP from HHPT    Time 2    Period Weeks    Status On-going    Target Date 09/11/21      PT SHORT TERM GOAL #2   Title Pt. will demonstrate improved midline orientation with no weight shift to R with standing, transfers, or walking.    Baseline weight shifts and leans to R    Time 6    Period Weeks    Status On-going    Target Date 10/09/21      PT SHORT TERM GOAL #3   Title Pt. will demonstrate improved L ankle eversion strength to 3/5 for safety in gait    Baseline 1/5 L ankle, rolls, using brace    Time 6    Period Weeks    Status On-going    Target Date 10/09/21               PT Long Term Goals - 09/01/21 1334       PT LONG TERM GOAL #1   Title Pt. will be independent with progressed HEP to improve outcomes.    Time 12    Period Weeks    Status On-going    Target Date 11/20/21      PT LONG TERM GOAL #2   Title Pt. will be able to ambulate 1000' safely without AD for safety with community ambulation.    Time 12    Period Weeks    Status On-going     Target Date 11/20/21      PT LONG TERM GOAL #3   Title Pt. will be able to ascend/descend 12 stairs safely with 1 HR to access home.    Time 12    Period Weeks    Status On-going    Target Date 11/20/21      PT LONG TERM GOAL #4   Title Pt. will be able to maintain tandem stance x 30 sec bil to demonstrate improved balance.    Baseline unable to maintain tandem stance    Time 12    Period Weeks    Status On-going    Target Date 11/20/21      PT LONG TERM GOAL #5   Title Pt. will score at least 62% on FOTO to demonstrate improved functional mobility    Baseline 53% (47% disability)    Time 12    Period Weeks    Status On-going    Target Date 11/20/21                   Plan - 09/03/21 1407     Clinical Impression Statement Focused today's session on progressing exercises for LLE strengthening and balance, also WB exercises for LUE strengthening as well.   He demonstrated good LLE strength and was able to step up and down 4" step without difficulty, and also able to perform prone press-ups and even full plank x 3 seconds today.  Still very challenged with balance activities and weight shifting into single leg stance to tap with R foot.  Educated to perform exercises slowly at home for eccentric strengthening.    Personal Factors and Comorbidities Comorbidity 3+    Comorbidities knee OA, history L ankle injuries, spinal stenosis, HTN    Examination-Activity Limitations Bend;Carry;Lift;Locomotion Level;Transfers;Stairs;Stand    Examination-Participation Restrictions Cleaning;Meal Prep;Yard Work;Community  Activity;Laundry;Shop    Stability/Clinical Decision Making Evolving/Moderate complexity    Rehab Potential Good    PT Frequency 2x / week    PT Duration 12 weeks    PT Treatment/Interventions ADLs/Self Care Home Management;Moist Heat;Cryotherapy;Gait training;Stair training;Functional mobility training;Therapeutic activities;Therapeutic exercise;Balance  training;Neuromuscular re-education;Patient/family education;Manual techniques    PT Next Visit Plan standing corner balance exercises    Consulted and Agree with Plan of Care Patient             Patient will benefit from skilled therapeutic intervention in order to improve the following deficits and impairments:  Abnormal gait, Decreased endurance, Decreased strength, Decreased balance, Decreased mobility, Decreased safety awareness, Difficulty walking, Improper body mechanics, Impaired vision/preception  Visit Diagnosis: Unsteadiness on feet  Difficulty in walking, not elsewhere classified  Muscle weakness (generalized)     Problem List Patient Active Problem List   Diagnosis Date Noted   Cerebral thrombosis with cerebral infarction 06/17/2021   Essential hypertension 06/16/2021   Depression 06/16/2021   Osteoarthritis 06/16/2021   Insomnia 06/16/2021   Acute left-sided weakness 06/15/2021    Jena Gauss, PT, DPT  09/03/2021, 3:13 PM  [facility] Rehabilitation [facility] [address]  [address] [place], [place], [address] Phone: [phone]   Fax:  [phone]  Name: ORLANDUS BOROWSKI MRN: [id number] Date of Birth: 02/28/1941

## 2021-09-04 ENCOUNTER — Other Ambulatory Visit (HOSPITAL_COMMUNITY): Payer: Self-pay

## 2021-09-07 ENCOUNTER — Ambulatory Visit: Payer: PPO | Admitting: Physical Therapy

## 2021-09-08 ENCOUNTER — Encounter: Payer: Self-pay | Admitting: Physical Therapy

## 2021-09-08 ENCOUNTER — Ambulatory Visit: Payer: PPO | Admitting: Physical Therapy

## 2021-09-08 ENCOUNTER — Other Ambulatory Visit: Payer: Self-pay

## 2021-09-08 DIAGNOSIS — M6281 Muscle weakness (generalized): Secondary | ICD-10-CM

## 2021-09-08 DIAGNOSIS — R2681 Unsteadiness on feet: Secondary | ICD-10-CM | POA: Diagnosis not present

## 2021-09-08 DIAGNOSIS — R262 Difficulty in walking, not elsewhere classified: Secondary | ICD-10-CM

## 2021-09-08 NOTE — Therapy (Signed)
[facility] [address]  [place], [place], [address] Phone: [phone]   Fax:  [phone]  Physical Therapy Treatment  Patient Details  Name: Charles Hale MRN: [id number] Date of Birth: [birth year] Referring Provider (PT): Egbert Garibaldi PA-C   Encounter Date: 09/08/2021   PT End of Session - 09/08/21 1320     Visit Number 4    Number of Visits 24    Date for PT Re-Evaluation 11/20/21    Authorization Type HT advantage    Progress Note Due on Visit 10    PT Start Time 1188    PT Stop Time 1357    PT Time Calculation (min) 40 min    Equipment Utilized During Treatment Gait belt    Activity Tolerance Patient tolerated treatment well    Behavior During Therapy WFL for tasks assessed/performed             Past Medical History:  Diagnosis Date   Anxiety    Arthritis    Hypertension    Spinal stenosis     Past Surgical History:  Procedure Laterality Date   SHOULDER ARTHROSCOPY      There were no vitals filed for this visit.   Subjective Assessment - 09/08/21 1319     Subjective Pt. reports moving furniture which "stirred up my back" and also having trouble with the knee sleeve, bothers him if using a cane.    Pertinent History history chronic LBP, stenosis, L ankle fracture, L ankle severe sprain, OA R knee    Diagnostic tests 08/16/21- IMPRESSION:  1. Small focus of acute ischemia within the ventral right medulla  oblongata. No hemorrhage or mass effect.  2. Advanced atrophy and chronic ischemic microangiopathy.    Patient Stated Goals "walk"    Currently in Pain? Yes    Pain Score 3     Pain Location Back                               [facility] Adult PT Treatment/Exercise - 09/08/21 0001       Ambulation/Gait   Stairs Yes    Stairs Assistance 5: Supervision    Stair Management Technique One rail Right;Step to pattern    Number of Stairs 14    Height of Stairs 8       Knee/Hip Exercises: Aerobic   Nustep L6 x 6 min      Knee/Hip Exercises: Standing   Other Standing Knee Exercises counter push-ups 2 x 10      Knee/Hip Exercises: Seated   Other Seated Knee/Hip Exercises ankle circles x 10 LLE    Sit to Sand 20 reps;with UE support;Other (comment)                 Balance Exercises - 09/08/21 0001       Balance Exercises: Standing   Standing, One Foot on a Step Eyes open;Trunk rotation;Limitations    Standing, One Foot on a Step Limitations standing with RLE on foam to promote L stance, while raising 1000g (red) ball diagonals 2 x 10 each direction to challenge balance, CGA for safety    Stepping Strategy Lateral;Limitations    Stepping Strategy Limitations foot on slider to encourage eccentric control and proprioception, 2 x 10 bil, CGA for safety                  PT Short Term Goals -  09/08/21 1409       PT SHORT TERM GOAL #1   Title Ind. with initial HEP    Baseline has HEP from HHPT    Time 2    Period Weeks    Status Achieved    Target Date 09/11/21      PT SHORT TERM GOAL #2   Title Pt. will demonstrate improved midline orientation with no weight shift to R with standing, transfers, or walking.    Baseline weight shifts and leans to R    Time 6    Period Weeks    Status Achieved    Target Date 10/09/21      PT SHORT TERM GOAL #3   Title Pt. will demonstrate improved L ankle eversion strength to 3/5 for safety in gait    Baseline 1/5 L ankle, rolls, using brace    Time 6    Period Weeks    Status On-going    Target Date 10/09/21               PT Long Term Goals - 09/01/21 1334       PT LONG TERM GOAL #1   Title Pt. will be independent with progressed HEP to improve outcomes.    Time 12    Period Weeks    Status On-going    Target Date 11/20/21      PT LONG TERM GOAL #2   Title Pt. will be able to ambulate 1000' safely without AD for safety with community ambulation.    Time 12    Period Weeks     Status On-going    Target Date 11/20/21      PT LONG TERM GOAL #3   Title Pt. will be able to ascend/descend 12 stairs safely with 1 HR to access home.    Time 12    Period Weeks    Status On-going    Target Date 11/20/21      PT LONG TERM GOAL #4   Title Pt. will be able to maintain tandem stance x 30 sec bil to demonstrate improved balance.    Baseline unable to maintain tandem stance    Time 12    Period Weeks    Status On-going    Target Date 11/20/21      PT LONG TERM GOAL #5   Title Pt. will score at least 62% on FOTO to demonstrate improved functional mobility    Baseline 53% (47% disability)    Time 12    Period Weeks    Status On-going    Target Date 11/20/21                   Plan - 09/08/21 1406     Clinical Impression Statement Pt. is making good progress and has met STG#1 and #2, with significant improvement in midline orientation.  He was able to ascend/descend full flight of stairs today with 1 HR, step to pattern, and CGA for safety.  Cues to not attempt reciprocal step at this time.  He was a little challenged today with lateral slides due to increased R knee pain, but demonstrated good stability with maintaining weight shift on LLE when RLE raised and weighted diagonals.  His gait is improving, noting longer stride length and reciprocal step now with SPC.  He was cautioned not to do tricep dips on walker at home (he reports lifting his feet off ground), insteady worked on counter push-ups for UE strengthening.  He  would benefit from continued skilled therapy.    Personal Factors and Comorbidities Comorbidity 3+    Comorbidities knee OA, history L ankle injuries, spinal stenosis, HTN    Examination-Activity Limitations Bend;Carry;Lift;Locomotion Level;Transfers;Stairs;Stand    Examination-Participation Restrictions Cleaning;Meal Prep;Yard Work;Community Activity;Laundry;Shop    Stability/Clinical Decision Making Evolving/Moderate complexity    Rehab  Potential Good    PT Frequency 2x / week    PT Duration 12 weeks    PT Treatment/Interventions ADLs/Self Care Home Management;Moist Heat;Cryotherapy;Gait training;Stair training;Functional mobility training;Therapeutic activities;Therapeutic exercise;Balance training;Neuromuscular re-education;Patient/family education;Manual techniques    PT Next Visit Plan standing corner balance exercises    Consulted and Agree with Plan of Care Patient             Patient will benefit from skilled therapeutic intervention in order to improve the following deficits and impairments:  Abnormal gait, Decreased endurance, Decreased strength, Decreased balance, Decreased mobility, Decreased safety awareness, Difficulty walking, Improper body mechanics, Impaired vision/preception  Visit Diagnosis: Unsteadiness on feet  Difficulty in walking, not elsewhere classified  Muscle weakness (generalized)     Problem List Patient Active Problem List   Diagnosis Date Noted   Cerebral thrombosis with cerebral infarction 06/17/2021   Essential hypertension 06/16/2021   Depression 06/16/2021   Osteoarthritis 06/16/2021   Insomnia 06/16/2021   Acute left-sided weakness 06/15/2021    Rennie Natter, PT, DPT  09/08/2021, 2:11 PM  [facility] [address]  [place], [place], [address] Phone: [phone]   Fax:  [phone]  Name: Charles Hale MRN: [id number] Date of Birth: [birth year]

## 2021-09-09 ENCOUNTER — Other Ambulatory Visit (HOSPITAL_COMMUNITY): Payer: Self-pay

## 2021-09-10 ENCOUNTER — Ambulatory Visit: Payer: PPO | Admitting: Physical Therapy

## 2021-09-10 ENCOUNTER — Other Ambulatory Visit: Payer: Self-pay

## 2021-09-10 ENCOUNTER — Encounter: Payer: Self-pay | Admitting: Physical Therapy

## 2021-09-10 DIAGNOSIS — R2681 Unsteadiness on feet: Secondary | ICD-10-CM | POA: Diagnosis not present

## 2021-09-10 DIAGNOSIS — M6281 Muscle weakness (generalized): Secondary | ICD-10-CM

## 2021-09-10 DIAGNOSIS — R262 Difficulty in walking, not elsewhere classified: Secondary | ICD-10-CM

## 2021-09-10 NOTE — Therapy (Signed)
[facility] Outpatient Rehabilitation [facility] [address]  [address] [place], [place], [address] Phone: [phone]   Fax:  [phone]  Physical Therapy Treatment  Patient Details  Name: Charles Hale MRN: [id number] Date of Birth: [birth year] Referring Provider (PT): Doreen Salvage PA-C   Encounter Date: 09/10/2021   PT End of Session - 09/10/21 1314     Visit Number 5    Number of Visits 24    Date for PT Re-Evaluation 11/20/21    Authorization Type HT advantage    Progress Note Due on Visit 10    PT Start Time 1315    PT Stop Time 1359    PT Time Calculation (min) 44 min    Equipment Utilized During Treatment Gait belt    Activity Tolerance Patient tolerated treatment well    Behavior During Therapy WFL for tasks assessed/performed             Past Medical History:  Diagnosis Date   Anxiety    Arthritis    Hypertension    Spinal stenosis     Past Surgical History:  Procedure Laterality Date   SHOULDER ARTHROSCOPY      There were no vitals filed for this visit.   Subjective Assessment - 09/10/21 1314     Subjective "Back gave me fit yesterday but better today.  Tried to go up my stairs, got halfway and decided not a good idea, so came back down."  Reports starting on gabapentin for back/knee pain, it makes him very sleepy and thinks he feels more unsteady today because of it.    Pertinent History history chronic LBP, stenosis, L ankle fracture, L ankle severe sprain, OA R knee    Diagnostic tests 08/16/21- IMPRESSION:  1. Small focus of acute ischemia within the ventral right medulla  oblongata. No hemorrhage or mass effect.  2. Advanced atrophy and chronic ischemic microangiopathy.    Patient Stated Goals "walk"    Currently in Pain? Yes    Pain Score 3     Pain Location Knee    Pain Orientation Left                               [facility] Adult PT Treatment/Exercise - 09/10/21 0001       Ambulation/Gait    Ambulation/Gait Yes    Ambulation/Gait Assistance 5: Supervision    Ambulation/Gait Assistance Details CGA for safety on stairs and transitions, also with walking poles.  Cues throughout for posture and heel strike.    Ambulation Distance (Feet) 300 Feet   300'   Assistive device Straight cane    Gait Pattern Step-through pattern;Lateral trunk lean to right;Poor foot clearance - left    Stairs Yes    Stairs Assistance 5: Supervision    Stair Management Technique One rail Right;Step to pattern;With cane   SPC in L hand   Number of Stairs 14    Height of Stairs 8    Gait Comments Gait with walking pole on R to promote more upright posture and decrease lean to R, mirror for feedback, retro walking with pole, 2 x 30' bil      Knee/Hip Exercises: Aerobic   Nustep L6 x 6 min      Knee/Hip Exercises: Standing   Lateral Step Up Left;10 reps;Hand Hold: 2;Step Height: 8"    Lateral Step Up Limitations bottom step    Forward Step Up Left;2  sets;10 reps;Hand Hold: 1;Step Height: 8"    Forward Step Up Limitations SPC in L, bottom step with HR                 Balance Exercises - 09/10/21 0001       Balance Exercises: Standing   Stepping Strategy Anterior;Lateral;10 reps;Limitations    Stepping Strategy Limitations stepping over theraband laterally, over 1/2 foam roller anterior, mirror for posture, CGA for safety.    Other Standing Exercises --                  PT Short Term Goals - 09/08/21 1409       PT SHORT TERM GOAL #1   Title Ind. with initial HEP    Baseline has HEP from HHPT    Time 2    Period Weeks    Status Achieved    Target Date 09/11/21      PT SHORT TERM GOAL #2   Title Pt. will demonstrate improved midline orientation with no weight shift to R with standing, transfers, or walking.    Baseline weight shifts and leans to R    Time 6    Period Weeks    Status Achieved    Target Date 10/09/21      PT SHORT TERM GOAL #3   Title Pt. will demonstrate  improved L ankle eversion strength to 3/5 for safety in gait    Baseline 1/5 L ankle, rolls, using brace    Time 6    Period Weeks    Status On-going    Target Date 10/09/21               PT Long Term Goals - 09/01/21 1334       PT LONG TERM GOAL #1   Title Pt. will be independent with progressed HEP to improve outcomes.    Time 12    Period Weeks    Status On-going    Target Date 11/20/21      PT LONG TERM GOAL #2   Title Pt. will be able to ambulate 1000' safely without AD for safety with community ambulation.    Time 12    Period Weeks    Status On-going    Target Date 11/20/21      PT LONG TERM GOAL #3   Title Pt. will be able to ascend/descend 12 stairs safely with 1 HR to access home.    Time 12    Period Weeks    Status On-going    Target Date 11/20/21      PT LONG TERM GOAL #4   Title Pt. will be able to maintain tandem stance x 30 sec bil to demonstrate improved balance.    Baseline unable to maintain tandem stance    Time 12    Period Weeks    Status On-going    Target Date 11/20/21      PT LONG TERM GOAL #5   Title Pt. will score at least 62% on FOTO to demonstrate improved functional mobility    Baseline 53% (47% disability)    Time 12    Period Weeks    Status On-going    Target Date 11/20/21                   Plan - 09/10/21 1521     Clinical Impression Statement Pt. continues to make good progress, but noted today with gate frequent small LOB to R when walking in  hallway.  His coordination is improving with his L foot, but still caught foot frequently when stepping over 1/2 foam roller.  Able to step up and down bottom step with L foot easily, but fatigued quickly today and needed frequent rest breaks.  Tried walking pole to improve posture with gait, but had significant difficulty coordinating, and while posture improved reverted back to step to gait.  He would benefit from continued skilled therapy.    Personal Factors and  Comorbidities Comorbidity 3+    Comorbidities knee OA, history L ankle injuries, spinal stenosis, HTN    Examination-Activity Limitations Bend;Carry;Lift;Locomotion Level;Transfers;Stairs;Stand    Examination-Participation Restrictions Cleaning;Meal Prep;Yard Work;Community Activity;Laundry;Shop    Stability/Clinical Decision Making Evolving/Moderate complexity    Rehab Potential Good    PT Frequency 2x / week    PT Duration 12 weeks    PT Treatment/Interventions ADLs/Self Care Home Management;Moist Heat;Cryotherapy;Gait training;Stair training;Functional mobility training;Therapeutic activities;Therapeutic exercise;Balance training;Neuromuscular re-education;Patient/family education;Manual techniques    PT Next Visit Plan standing corner balance exercises    Consulted and Agree with Plan of Care Patient             Patient will benefit from skilled therapeutic intervention in order to improve the following deficits and impairments:  Abnormal gait, Decreased endurance, Decreased strength, Decreased balance, Decreased mobility, Decreased safety awareness, Difficulty walking, Improper body mechanics, Impaired vision/preception  Visit Diagnosis: Unsteadiness on feet  Difficulty in walking, not elsewhere classified  Muscle weakness (generalized)     Problem List Patient Active Problem List   Diagnosis Date Noted   Cerebral thrombosis with cerebral infarction 06/17/2021   Essential hypertension 06/16/2021   Depression 06/16/2021   Osteoarthritis 06/16/2021   Insomnia 06/16/2021   Acute left-sided weakness 06/15/2021    Jena Gauss, PT, DPT  09/10/2021, 3:27 PM  [facility] Outpatient Rehabilitation [facility] [address]  [address] [place], [place], [address] Phone: [phone]   Fax:  [phone]  Name: Charles Hale MRN: [id number] Date of Birth: 1940-11-25

## 2021-09-14 ENCOUNTER — Other Ambulatory Visit (HOSPITAL_COMMUNITY): Payer: Self-pay

## 2021-09-14 ENCOUNTER — Ambulatory Visit: Payer: PPO | Admitting: Physical Therapy

## 2021-09-15 ENCOUNTER — Other Ambulatory Visit (HOSPITAL_COMMUNITY): Payer: Self-pay

## 2021-09-15 ENCOUNTER — Ambulatory Visit: Payer: PPO | Admitting: Physical Therapy

## 2021-09-15 ENCOUNTER — Encounter: Payer: Self-pay | Admitting: Physical Therapy

## 2021-09-15 ENCOUNTER — Other Ambulatory Visit: Payer: Self-pay

## 2021-09-15 DIAGNOSIS — R2681 Unsteadiness on feet: Secondary | ICD-10-CM | POA: Diagnosis not present

## 2021-09-15 DIAGNOSIS — R262 Difficulty in walking, not elsewhere classified: Secondary | ICD-10-CM

## 2021-09-15 DIAGNOSIS — M6281 Muscle weakness (generalized): Secondary | ICD-10-CM

## 2021-09-15 NOTE — Therapy (Signed)
[facility] [address]  [address] [place], [place], [address] Phone: [phone]   Fax:  [phone]  Physical Therapy Treatment  Patient Details  Name: Charles Hale MRN: [id number] Date of Birth: 04-21-1941 Referring Provider (PT): Doreen Salvage PA-C   Encounter Date: 09/15/2021   PT End of Session - 09/15/21 1444     Visit Number 6    Number of Visits 24    Date for PT Re-Evaluation 11/20/21    Authorization Type HT advantage    Progress Note Due on Visit 10    PT Start Time 1445    PT Stop Time 1530    PT Time Calculation (min) 45 min    Equipment Utilized During Treatment Gait belt    Activity Tolerance Patient tolerated treatment well    Behavior During Therapy WFL for tasks assessed/performed             Past Medical History:  Diagnosis Date   Anxiety    Arthritis    Hypertension    Spinal stenosis     Past Surgical History:  Procedure Laterality Date   SHOULDER ARTHROSCOPY      There were no vitals filed for this visit.   Subjective Assessment - 09/15/21 1445     Subjective Having trouble with back and pain/numbness going down to his feet today.  No new falls.    Pertinent History history chronic LBP, stenosis, L ankle fracture, L ankle severe sprain, OA R knee    Diagnostic tests 08/16/21- IMPRESSION:  1. Small focus of acute ischemia within the ventral right medulla  oblongata. No hemorrhage or mass effect.  2. Advanced atrophy and chronic ischemic microangiopathy.    Patient Stated Goals "walk"    Currently in Pain? Yes    Pain Score 2     Pain Location Back    Pain Orientation Lower    Pain Descriptors / Indicators Numbness    Pain Type Chronic pain                               [facility] Adult PT Treatment/Exercise - 09/15/21 0001       Knee/Hip Exercises: Aerobic   Nustep L6 x 6 min      Knee/Hip Exercises: Machines for Strengthening   Cybex Knee Extension 35#  2 x 10    Cybex Knee Flexion 20# x 10, 35# 2 x 10      Knee/Hip Exercises: Standing   Lateral Step Up Both;2 sets;10 reps;Step Height: 6";Hand Hold: 1    Lateral Step Up Limitations 2 risers on aerobic step    Forward Step Up Both;2 sets;10 reps;Hand Hold: 2;Step Height: 6"    Forward Step Up Limitations 2 risers on aerobic step      Knee/Hip Exercises: Supine   Bridges Strengthening;20 reps    Bridges Limitations added additional set with RTB and hip abduction    Straight Leg Raises Strengthening;Left;10 reps;Limitations    Straight Leg Raises Limitations added arc to increase muscle activation and proprioception      Knee/Hip Exercises: Sidelying   Hip ABduction Strengthening;Left;10 reps    Hip ABduction Limitations very challenging    Clams 2 x10 L                     PT Education - 09/15/21 1534     Education Details HEP update Access Code: [email or web address];  issued RTB    Person(s) Educated Patient    Methods Explanation;Demonstration;Verbal cues;Handout    Comprehension Verbalized understanding;Returned demonstration              PT Short Term Goals - 09/08/21 1409       PT SHORT TERM GOAL #1   Title Ind. with initial HEP    Baseline has HEP from HHPT    Time 2    Period Weeks    Status Achieved    Target Date 09/11/21      PT SHORT TERM GOAL #2   Title Pt. will demonstrate improved midline orientation with no weight shift to R with standing, transfers, or walking.    Baseline weight shifts and leans to R    Time 6    Period Weeks    Status Achieved    Target Date 10/09/21      PT SHORT TERM GOAL #3   Title Pt. will demonstrate improved L ankle eversion strength to 3/5 for safety in gait    Baseline 1/5 L ankle, rolls, using brace    Time 6    Period Weeks    Status On-going    Target Date 10/09/21               PT Long Term Goals - 09/01/21 1334       PT LONG TERM GOAL #1   Title Pt. will be independent with progressed HEP to improve  outcomes.    Time 12    Period Weeks    Status On-going    Target Date 11/20/21      PT LONG TERM GOAL #2   Title Pt. will be able to ambulate 1000' safely without AD for safety with community ambulation.    Time 12    Period Weeks    Status On-going    Target Date 11/20/21      PT LONG TERM GOAL #3   Title Pt. will be able to ascend/descend 12 stairs safely with 1 HR to access home.    Time 12    Period Weeks    Status On-going    Target Date 11/20/21      PT LONG TERM GOAL #4   Title Pt. will be able to maintain tandem stance x 30 sec bil to demonstrate improved balance.    Baseline unable to maintain tandem stance    Time 12    Period Weeks    Status On-going    Target Date 11/20/21      PT LONG TERM GOAL #5   Title Pt. will score at least 62% on FOTO to demonstrate improved functional mobility    Baseline 53% (47% disability)    Time 12    Period Weeks    Status On-going    Target Date 11/20/21                   Plan - 09/15/21 1536     Clinical Impression Statement Focus of today's interventions was on improving LE strength and endurance.  He was having more difficulty with LLE proprioception and placement with step ups today, and very challenged with hip abduction in SL due to poor awareness of LLE.  Otherwise, able to tolerate progression of exercises including more resistance without increased LBP, and only minimal rest breaks.  He would benefit from continued skilled therapy.    Personal Factors and Comorbidities Comorbidity 3+    Comorbidities knee OA, history L ankle injuries,  spinal stenosis, HTN    Examination-Activity Limitations Bend;Carry;Lift;Locomotion Level;Transfers;Stairs;Stand    Examination-Participation Restrictions Cleaning;Meal Prep;Yard Work;Community Activity;Laundry;Shop    Stability/Clinical Decision Making Evolving/Moderate complexity    Rehab Potential Good    PT Frequency 2x / week    PT Duration 12 weeks    PT  Treatment/Interventions ADLs/Self Care Home Management;Moist Heat;Cryotherapy;Gait training;Stair training;Functional mobility training;Therapeutic activities;Therapeutic exercise;Balance training;Neuromuscular re-education;Patient/family education;Manual techniques    PT Next Visit Plan standing corner balance exercises    Consulted and Agree with Plan of Care Patient             Patient will benefit from skilled therapeutic intervention in order to improve the following deficits and impairments:  Abnormal gait, Decreased endurance, Decreased strength, Decreased balance, Decreased mobility, Decreased safety awareness, Difficulty walking, Improper body mechanics, Impaired vision/preception  Visit Diagnosis: Unsteadiness on feet  Difficulty in walking, not elsewhere classified  Muscle weakness (generalized)     Problem List Patient Active Problem List   Diagnosis Date Noted   Cerebral thrombosis with cerebral infarction 06/17/2021   Essential hypertension 06/16/2021   Depression 06/16/2021   Osteoarthritis 06/16/2021   Insomnia 06/16/2021   Acute left-sided weakness 06/15/2021    Jena Gauss, PT, DPT  09/15/2021, 3:38 PM  [facility] [address]  [address] [place], [place], [address] Phone: [phone]   Fax:  [phone]  Name: BEAU RAMSBURG MRN: [id number] Date of Birth: 30-Jul-1940

## 2021-09-15 NOTE — Patient Instructions (Signed)
Access Code: [email or web address] URL: [email or web address] Date: 09/15/2021 Prepared by: Harrie Foreman  Exercises Supine Bridge with Resistance Band - 1 x daily - 7 x weekly - 3 sets - 10 reps Clamshell with Resistance - 1 x daily - 7 x weekly - 3 sets - 10 reps

## 2021-09-17 ENCOUNTER — Other Ambulatory Visit: Payer: Self-pay

## 2021-09-17 ENCOUNTER — Ambulatory Visit: Payer: PPO | Attending: Internal Medicine

## 2021-09-17 DIAGNOSIS — M6281 Muscle weakness (generalized): Secondary | ICD-10-CM | POA: Insufficient documentation

## 2021-09-17 DIAGNOSIS — R2681 Unsteadiness on feet: Secondary | ICD-10-CM | POA: Diagnosis not present

## 2021-09-17 DIAGNOSIS — R262 Difficulty in walking, not elsewhere classified: Secondary | ICD-10-CM | POA: Insufficient documentation

## 2021-09-17 NOTE — Therapy (Signed)
[place] [facility] ?[address]  [address] ?[place], [place], [address] ?Phone: [phone]   Fax:  [phone] ? ?Physical Therapy Treatment ? ?Patient Details  ?Name: Charles Hale ?MRN: [id number] ?Date of Birth: 10/15/1940 ?Referring Provider (PT): Bulla, Opal Sidles ? ? ?Encounter Date: 09/17/2021 ? ? PT End of Session - 09/17/21 1532   ? ? Visit Number 7   ? Number of Visits 24   ? Date for PT Re-Evaluation 11/20/21   ? Authorization Type HT advantage   ? PT Start Time 1447   ? PT Stop Time 1530   ? PT Time Calculation (min) 43 min   ? Activity Tolerance Patient tolerated treatment well   ? Behavior During Therapy [facility] for tasks assessed/performed   ? ?  ?  ? ?  ? ? ?Past Medical History:  ?Diagnosis Date  ? Anxiety   ? Arthritis   ? Hypertension   ? Spinal stenosis   ? ? ?Past Surgical History:  ?Procedure Laterality Date  ? SHOULDER ARTHROSCOPY    ? ? ?There were no vitals filed for this visit. ? ? Subjective Assessment - 09/17/21 1451   ? ? Subjective Pt reports no pain, getting some more feeling back in the toes.   ? Pertinent History history chronic LBP, stenosis, L ankle fracture, L ankle severe sprain, OA R knee   ? Diagnostic tests 08/16/21- IMPRESSION:  1. Small focus of acute ischemia within the ventral right medulla  oblongata. No hemorrhage or mass effect.  2. Advanced atrophy and chronic ischemic microangiopathy.   ? Patient Stated Goals "walk"   ? Currently in Pain? No/denies   ? ?  ?  ? ?  ? ? ? ? ? ? ? ? ? ? ? ? ? ? ? ? ? ? ? ? [facility] Adult PT Treatment/Exercise - 09/17/21 0001   ? ?  ? Knee/Hip Exercises: Aerobic  ? Recumbent Bike L2x70min   ?  ? Knee/Hip Exercises: Machines for Strengthening  ? Cybex Knee Extension 25# 2 x 10, focused on slow eccentric lowering   ?  ? Knee/Hip Exercises: Standing  ? Heel Raises Both;10 reps   ? Heel Raises Limitations counter support   ? Other Standing Knee Exercises standing toe rasies with counter support x 10   ?   ? Knee/Hip Exercises: Supine  ? Hip Adduction Isometric Strengthening;Both;10 reps   ? Hip Adduction Isometric Limitations 5 second hold   ? Bridges with Beacher May Strengthening;Both;15 reps   ?  ? Knee/Hip Exercises: Sidelying  ? Clams YTB x 15, R/L   ?  ? Shoulder Exercises: ROM/Strengthening  ? Cybex Row Limitations 25lb B UE 2x10   ? ?  ?  ? ?  ? ? ? ? ? ? ? ? ? ? ? ? PT Short Term Goals - 09/08/21 1409   ? ?  ? PT SHORT TERM GOAL #1  ? Title Ind. with initial HEP   ? Baseline has HEP from HHPT   ? Time 2   ? Period Weeks   ? Status Achieved   ? Target Date 09/11/21   ?  ? PT SHORT TERM GOAL #2  ? Title Pt. will demonstrate improved midline orientation with no weight shift to R with standing, transfers, or walking.   ? Baseline weight shifts and leans to R   ? Time 6   ? Period Weeks   ? Status Achieved   ?  Target Date 10/09/21   ?  ? PT SHORT TERM GOAL #3  ? Title Pt. will demonstrate improved L ankle eversion strength to 3/5 for safety in gait   ? Baseline 1/5 L ankle, rolls, using brace   ? Time 6   ? Period Weeks   ? Status On-going   ? Target Date 10/09/21   ? ?  ?  ? ?  ? ? ? ? PT Long Term Goals - 09/01/21 1334   ? ?  ? PT LONG TERM GOAL #1  ? Title Pt. will be independent with progressed HEP to improve outcomes.   ? Time 12   ? Period Weeks   ? Status On-going   ? Target Date 11/20/21   ?  ? PT LONG TERM GOAL #2  ? Title Pt. will be able to ambulate 1000' safely without AD for safety with community ambulation.   ? Time 12   ? Period Weeks   ? Status On-going   ? Target Date 11/20/21   ?  ? PT LONG TERM GOAL #3  ? Title Pt. will be able to ascend/descend 12 stairs safely with 1 HR to access home.   ? Time 12   ? Period Weeks   ? Status On-going   ? Target Date 11/20/21   ?  ? PT LONG TERM GOAL #4  ? Title Pt. will be able to maintain tandem stance x 30 sec bil to demonstrate improved balance.   ? Baseline unable to maintain tandem stance   ? Time 12   ? Period Weeks   ? Status On-going   ? Target Date  11/20/21   ?  ? PT LONG TERM GOAL #5  ? Title Pt. will score at least 62% on FOTO to demonstrate improved functional mobility   ? Baseline 53% (47% disability)   ? Time 12   ? Period Weeks   ? Status On-going   ? Target Date 11/20/21   ? ?  ?  ? ?  ? ? ? ? ? ? ? ? Plan - 09/17/21 1532   ? ? Clinical Impression Statement Pt did well with the progression of exercises today. Required more cues for eccentric control with most exercises. Pt showed more control with clams and able to progress bridges. He was able to bring toes off of the ground with standing toe raises but not much. He noted more sensation now in his toes and attributes this to the exercises on the weight machines. He would benefit from more functional strengthening.   ? Personal Factors and Comorbidities Comorbidity 3+   ? Comorbidities knee OA, history L ankle injuries, spinal stenosis, HTN   ? PT Frequency 2x / week   ? PT Duration 12 weeks   ? PT Treatment/Interventions ADLs/Self Care Home Management;Moist Heat;Cryotherapy;Gait training;Stair training;Functional mobility training;Therapeutic activities;Therapeutic exercise;Balance training;Neuromuscular re-education;Patient/family education;Manual techniques   ? PT Next Visit Plan standing corner balance exercises; functional strengthening  ? Consulted and Agree with Plan of Care Patient   ? ?  ?  ? ?  ? ? ?Patient will benefit from skilled therapeutic intervention in order to improve the following deficits and impairments:  Abnormal gait, Decreased endurance, Decreased strength, Decreased balance, Decreased mobility, Decreased safety awareness, Difficulty walking, Improper body mechanics, Impaired vision/preception ? ?Visit Diagnosis: ?Unsteadiness on feet ? ?Difficulty in walking, not elsewhere classified ? ?Muscle weakness (generalized) ? ? ? ? ?Problem List ?Patient Active Problem List  ? Diagnosis Date Noted  ?  Cerebral thrombosis with cerebral infarction 06/17/2021  ? Essential hypertension  06/16/2021  ? Depression 06/16/2021  ? Osteoarthritis 06/16/2021  ? Insomnia 06/16/2021  ? Acute left-sided weakness 06/15/2021  ? ? ?Charles Hale, PTA ?09/17/2021, 6:03 PM ? ?[place] [facility] ?[address]  [address] ?[place], [place], [address] ?Phone: [phone]   Fax:  [phone] ? ?Name: Charles Hale ?MRN: [id number] ?Date of Birth: 1941-05-01 ? ? ? ?

## 2021-09-22 ENCOUNTER — Other Ambulatory Visit: Payer: Self-pay

## 2021-09-22 ENCOUNTER — Ambulatory Visit: Payer: PPO | Admitting: Physical Therapy

## 2021-09-22 ENCOUNTER — Encounter: Payer: Self-pay | Admitting: Physical Therapy

## 2021-09-22 DIAGNOSIS — R2681 Unsteadiness on feet: Secondary | ICD-10-CM

## 2021-09-22 DIAGNOSIS — R262 Difficulty in walking, not elsewhere classified: Secondary | ICD-10-CM

## 2021-09-22 DIAGNOSIS — M6281 Muscle weakness (generalized): Secondary | ICD-10-CM

## 2021-09-22 NOTE — Therapy (Signed)
[place] ?[facility] ?[address]  [address] ?[place], [place], [address] ?Phone: [phone]   Fax:  [phone] ? ?Physical Therapy Treatment ? ?Patient Details  ?Name: Charles Hale ?MRN: [id number] ?Date of Birth: 1940/10/21 ?Referring Provider (PT): Bulla, Opal Sidles ? ? ?Encounter Date: 09/22/2021 ? ? PT End of Session - 09/22/21 1453   ? ? Visit Number 8   ? Number of Visits 24   ? Date for PT Re-Evaluation 11/20/21   ? Authorization Type HT advantage   ? PT Start Time 1445   ? PT Stop Time 1530   ? PT Time Calculation (min) 45 min   ? Activity Tolerance Patient tolerated treatment well   ? Behavior During Therapy [facility] for tasks assessed/performed   ? ?  ?  ? ?  ? ? ?Past Medical History:  ?Diagnosis Date  ? Anxiety   ? Arthritis   ? Hypertension   ? Spinal stenosis   ? ? ?Past Surgical History:  ?Procedure Laterality Date  ? SHOULDER ARTHROSCOPY    ? ? ?There were no vitals filed for this visit. ? ? Subjective Assessment - 09/22/21 1454   ? ? Subjective Pt. reports he went upstairs 2x- first time able to get down stairs ok, second time couldn't, had to sit down and scoot.   ? Pertinent History history chronic LBP, stenosis, L ankle fracture, L ankle severe sprain, OA R knee   ? Diagnostic tests 08/16/21- IMPRESSION:  1. Small focus of acute ischemia within the ventral right medulla  oblongata. No hemorrhage or mass effect.  2. Advanced atrophy and chronic ischemic microangiopathy.   ? Patient Stated Goals "walk"   ? Currently in Pain? No/denies   no more than usual  ? ?  ?  ? ?  ? ? ? ? ? ? ? ? ? ? ? ? ? ? ? ? ? ? ? ? [facility] Adult PT Treatment/Exercise - 09/22/21 0001   ? ?  ? Ambulation/Gait  ? Ambulation/Gait Yes   ? Ambulation/Gait Assistance 4: Min guard   ? Ambulation/Gait Assistance Details CGA for safety   ? Ambulation Distance (Feet) 90 Feet   ? Assistive device None   ? Gait Pattern Step-through pattern   ? Stairs Yes   ? Stairs Assistance 4: Min guard   ?  Stair Management Technique One rail Right;Step to pattern;With cane   ? Number of Stairs 84   ? Height of Stairs 8   ? Pre-Gait Activities leaning on wall on L side, stepping foward and back with RLE while maintain weight shift to LLE   ? Gait Comments worked on progressing to reciprocal stepping pattern ascending on stairs.  Also improving gait and decreasing lean to R side without AD.   ?  ? Knee/Hip Exercises: Aerobic  ? Nustep L5 x 8 min   ? ?  ?  ? ?  ? ? ? ? ? ? ? ? ? ? ? ? PT Short Term Goals - 09/08/21 1409   ? ?  ? PT SHORT TERM GOAL #1  ? Title Ind. with initial HEP   ? Baseline has HEP from HHPT   ? Time 2   ? Period Weeks   ? Status Achieved   ? Target Date 09/11/21   ?  ? PT SHORT TERM GOAL #2  ? Title Pt. will demonstrate improved midline orientation with no weight shift to R with standing, transfers, or walking.   ?  Baseline weight shifts and leans to R   ? Time 6   ? Period Weeks   ? Status Achieved   ? Target Date 10/09/21   ?  ? PT SHORT TERM GOAL #3  ? Title Pt. will demonstrate improved L ankle eversion strength to 3/5 for safety in gait   ? Baseline 1/5 L ankle, rolls, using brace   ? Time 6   ? Period Weeks   ? Status On-going   ? Target Date 10/09/21   ? ?  ?  ? ?  ? ? ? ? PT Long Term Goals - 09/22/21 1454   ? ?  ? PT LONG TERM GOAL #1  ? Title Pt. will be independent with progressed HEP to improve outcomes.   ? Time 12   ? Period Weeks   ? Status On-going   ? Target Date 11/20/21   ?  ? PT LONG TERM GOAL #2  ? Title Pt. will be able to ambulate 1000' safely without AD for safety with community ambulation.   ? Time 12   ? Period Weeks   ? Status On-going   ? Target Date 11/20/21   ?  ? PT LONG TERM GOAL #3  ? Title Pt. will be able to ascend/descend 12 stairs safely with 1 HR to access home.   ? Time 12   ? Period Weeks   ? Status On-going   09/22/21- can go up at home, but still difficulty descending  ? Target Date 11/20/21   ?  ? PT LONG TERM GOAL #4  ? Title Pt. will be able to maintain  tandem stance x 30 sec bil to demonstrate improved balance.   ? Baseline unable to maintain tandem stance   ? Time 12   ? Period Weeks   ? Status On-going   ? Target Date 11/20/21   ?  ? PT LONG TERM GOAL #5  ? Title Pt. will score at least 62% on FOTO to demonstrate improved functional mobility   ? Baseline 53% (47% disability)   ? Time 12   ? Period Weeks   ? Status On-going   ? Target Date 11/20/21   ? ?  ?  ? ?  ? ? ? ? ? ? ? ? Plan - 09/22/21 1539   ? ? Clinical Impression Statement Pt. is still having difficulty on stairs at home, and also reports leaning to R.  Today focused on gait training on stairs, progressing to reciprocal stepping while going up stairs, but still step to going down stairs.  Also worked on exercise leaning against wall on L side while stepping with R foot foward and back, afterwards demonstrated improved gait without cane, but needed CGA due to fatigue.  He would benefit from continued skilled therapy.   ? Personal Factors and Comorbidities Comorbidity 3+   ? Comorbidities knee OA, history L ankle injuries, spinal stenosis, HTN   ? PT Frequency 2x / week   ? PT Duration 12 weeks   ? PT Treatment/Interventions ADLs/Self Care Home Management;Moist Heat;Cryotherapy;Gait training;Stair training;Functional mobility training;Therapeutic activities;Therapeutic exercise;Balance training;Neuromuscular re-education;Patient/family education;Manual techniques   ? PT Next Visit Plan standing corner balance exercises   ? Consulted and Agree with Plan of Care Patient   ? ?  ?  ? ?  ? ? ?Patient will benefit from skilled therapeutic intervention in order to improve the following deficits and impairments:  Abnormal gait, Decreased endurance, Decreased strength, Decreased balance, Decreased mobility, Decreased  safety awareness, Difficulty walking, Improper body mechanics, Impaired vision/preception ? ?Visit Diagnosis: ?Unsteadiness on feet ? ?Difficulty in walking, not elsewhere classified ? ?Muscle  weakness (generalized) ? ? ? ? ?Problem List ?Patient Active Problem List  ? Diagnosis Date Noted  ? Cerebral thrombosis with cerebral infarction 06/17/2021  ? Essential hypertension 06/16/2021  ? Depression 06/16/2021  ? Osteoarthritis 06/16/2021  ? Insomnia 06/16/2021  ? Acute left-sided weakness 06/15/2021  ? ? ?Jena Gauss, PT, DPT  ?09/22/2021, 5:09 PM ? ?[place] ?[facility] ?[address]  [address] ?[place], [place], [address] ?Phone: [phone]   Fax:  [phone] ? ?Name: Charles Hale ?MRN: [id number] ?Date of Birth: 1940-10-08 ? ? ? ?

## 2021-09-24 ENCOUNTER — Other Ambulatory Visit: Payer: Self-pay

## 2021-09-24 ENCOUNTER — Ambulatory Visit: Payer: PPO

## 2021-09-24 DIAGNOSIS — R2681 Unsteadiness on feet: Secondary | ICD-10-CM

## 2021-09-24 DIAGNOSIS — R262 Difficulty in walking, not elsewhere classified: Secondary | ICD-10-CM

## 2021-09-24 DIAGNOSIS — M6281 Muscle weakness (generalized): Secondary | ICD-10-CM

## 2021-09-24 NOTE — Therapy (Signed)
[place] ?[facility] ?[address]  [address] ?[place], [place], [address] ?Phone: [phone]   Fax:  [phone] ? ?Physical Therapy Treatment ? ?Patient Details  ?Name: Charles Hale ?MRN: [id number] ?Date of Birth: 05-12-1941 ?Referring Provider (PT): Bulla, Opal Sidlesonald PA-C ? ? ?Encounter Date: 09/24/2021 ? ? PT End of Session - 09/24/21 1532   ? ? Visit Number 9   ? Number of Visits 24   ? Date for PT Re-Evaluation 11/20/21   ? Authorization Type HT advantage   ? PT Start Time 1448   ? PT Stop Time 1527   ? PT Time Calculation (min) 39 min   ? Activity Tolerance Patient tolerated treatment well   ? Behavior During [facility] for tasks assessed/performed   ? ?  ?  ? ?  ? ? ?Past Medical History:  ?Diagnosis Date  ? Anxiety   ? Arthritis   ? Hypertension   ? Spinal stenosis   ? ? ?Past Surgical History:  ?Procedure Laterality Date  ? SHOULDER ARTHROSCOPY    ? ? ?There were no vitals filed for this visit. ? ? Subjective Assessment - 09/24/21 1449   ? ? Subjective Having some knee pain today, would like to work more on L UE strength.   ? Pertinent History history chronic LBP, stenosis, L ankle fracture, L ankle severe sprain, OA R knee   ? Diagnostic tests 08/16/21- IMPRESSION:  1. Small focus of acute ischemia within the ventral right medulla  oblongata. No hemorrhage or mass effect.  2. Advanced atrophy and chronic ischemic microangiopathy.   ? Patient Stated Goals "walk"   ? Currently in Pain? Yes   ? Pain Score 2    ? Pain Location Knee   ? Pain Orientation Right   ? Pain Descriptors / Indicators Aching   ? Pain Type Chronic pain   ? ?  ?  ? ?  ? ? ? ? ? ? ? ? ? ? ? ? ? ? ? ? ? ? ? ? [facility] Adult PT Treatment/Exercise - 09/24/21 0001   ? ?  ? Knee/Hip Exercises: Aerobic  ? Nustep L6x436min   ?  ? Knee/Hip Exercises: Machines for Strengthening  ? Cybex Knee Extension 35# 2 x 10   ?  ? Knee/Hip Exercises: Standing  ? Forward Step Up Both;3 sets;10 reps;Hand Hold: 2;Step  Height: 8"   ?  ? Knee/Hip Exercises: Seated  ? Sit to [facility] with UE support   12 reps  ?  ? Shoulder Exercises: Prone  ? Other Prone Exercises prone on elbows press up x 10   ? Other Prone Exercises plank 4x5 second hold; 5th rep 10 second hold   ?  ? Shoulder Exercises: ROM/Strengthening  ? Lat Pull Limitations 25lb 2x10, standing   ? Cybex Row Limitations 35lb B UE 2x10   ? ?  ?  ? ?  ? ? ? ? ? ? ? ? ? ? ? ? PT Short Term Goals - 09/08/21 1409   ? ?  ? PT SHORT TERM GOAL #1  ? Title Ind. with initial HEP   ? Baseline has HEP from HHPT   ? Time 2   ? Period Weeks   ? Status Achieved   ? Target Date 09/11/21   ?  ? PT SHORT TERM GOAL #2  ? Title Pt. will demonstrate improved midline orientation with no weight shift to R with standing, transfers, or walking.   ?  Baseline weight shifts and leans to R   ? Time 6   ? Period Weeks   ? Status Achieved   ? Target Date 10/09/21   ?  ? PT SHORT TERM GOAL #3  ? Title Pt. will demonstrate improved L ankle eversion strength to 3/5 for safety in gait   ? Baseline 1/5 L ankle, rolls, using brace   ? Time 6   ? Period Weeks   ? Status On-going   ? Target Date 10/09/21   ? ?  ?  ? ?  ? ? ? ? PT Long Term Goals - 09/22/21 1454   ? ?  ? PT LONG TERM GOAL #1  ? Title Pt. will be independent with progressed HEP to improve outcomes.   ? Time 12   ? Period Weeks   ? Status On-going   ? Target Date 11/20/21   ?  ? PT LONG TERM GOAL #2  ? Title Pt. will be able to ambulate 1000' safely without AD for safety with community ambulation.   ? Time 12   ? Period Weeks   ? Status On-going   ? Target Date 11/20/21   ?  ? PT LONG TERM GOAL #3  ? Title Pt. will be able to ascend/descend 12 stairs safely with 1 HR to access home.   ? Time 12   ? Period Weeks   ? Status On-going   09/22/21- can go up at home, but still difficulty descending  ? Target Date 11/20/21   ?  ? PT LONG TERM GOAL #4  ? Title Pt. will be able to maintain tandem stance x 30 sec bil to demonstrate improved balance.   ? Baseline  unable to maintain tandem stance   ? Time 12   ? Period Weeks   ? Status On-going   ? Target Date 11/20/21   ?  ? PT LONG TERM GOAL #5  ? Title Pt. will score at least 62% on FOTO to demonstrate improved functional mobility   ? Baseline 53% (47% disability)   ? Time 12   ? Period Weeks   ? Status On-going   ? Target Date 11/20/21   ? ?  ?  ? ?  ? ? ? ? ? ? ? ? Plan - 09/24/21 1533   ? ? Clinical Impression Statement Pt wanted to work more on UE strength today to allow him to support himself on handrail when navigating stairs. We pretty much progressed all resistance exercises today and did some standing w/o UE support. Provided tactile feedback for lumbopelvic control with standing step ups and progressed repetition. Pt responded well to the treatment.   ? Personal Factors and Comorbidities Comorbidity 3+   ? Comorbidities knee OA, history L ankle injuries, spinal stenosis, HTN   ? PT Frequency 2x / week   ? PT Duration 12 weeks   ? PT Treatment/Interventions ADLs/Self Care Home Management;Moist Heat;Cryotherapy;Gait training;Stair training;Functional mobility training;Therapeutic activities;Therapeutic exercise;Balance training;Neuromuscular re-education;Patient/family education;Manual techniques   ? PT Next Visit Plan standing corner balance exercises   ? Consulted and Agree with Plan of Care Patient   ? ?  ?  ? ?  ? ? ?Patient will benefit from skilled therapeutic intervention in order to improve the following deficits and impairments:  Abnormal gait, Decreased endurance, Decreased strength, Decreased balance, Decreased mobility, Decreased safety awareness, Difficulty walking, Improper body mechanics, Impaired vision/preception ? ?Visit Diagnosis: ?Unsteadiness on feet ? ?Difficulty in walking, not elsewhere classified ? ?  Muscle weakness (generalized) ? ? ? ? ?Problem List ?Patient Active Problem List  ? Diagnosis Date Noted  ? Cerebral thrombosis with cerebral infarction 06/17/2021  ? Essential hypertension  06/16/2021  ? Depression 06/16/2021  ? Osteoarthritis 06/16/2021  ? Insomnia 06/16/2021  ? Acute left-sided weakness 06/15/2021  ? ? ?Darleene Cleaver, PTA ?09/24/2021, 3:36 PM ? ?[place] ?[facility] ?[address]  [address] ?[place], [place], [address] ?Phone: [phone]   Fax:  [phone] ? ?Name: Charles Hale ?MRN: [id number] ?Date of Birth: October 02, 1940 ? ? ? ?

## 2021-09-29 ENCOUNTER — Other Ambulatory Visit: Payer: Self-pay

## 2021-09-29 ENCOUNTER — Encounter: Payer: Self-pay | Admitting: Physical Therapy

## 2021-09-29 ENCOUNTER — Ambulatory Visit: Payer: PPO | Admitting: Physical Therapy

## 2021-09-29 DIAGNOSIS — R2681 Unsteadiness on feet: Secondary | ICD-10-CM

## 2021-09-29 DIAGNOSIS — R262 Difficulty in walking, not elsewhere classified: Secondary | ICD-10-CM

## 2021-09-29 DIAGNOSIS — M6281 Muscle weakness (generalized): Secondary | ICD-10-CM

## 2021-09-29 NOTE — Therapy (Signed)
[place] ?[facility] ?[place] ?[place], [place], [address] ?Phone: [phone]   Fax:  [phone] ? ?Physical Therapy Treatment/Progress Note ? ?Progress Note ?Reporting Period 08/28/2021 to 09/29/2021 ? ?See note below for Objective Data and Assessment of Progress/Goals.  ? ? ? ?Patient Details  ?Name: Charles Hale ?MRN: [id number] ?Date of Birth: Jul 03, 1941 ?Referring Provider (PT): Bulla, Eather Colas ? ? ?Encounter Date: 09/29/2021 ? ? PT End of Session - 09/29/21 1453   ? ? Visit Number 10   ? Number of Visits 24   ? Date for PT Re-Evaluation 11/20/21   ? Authorization Type HT advantage   ? PT Start Time 2500   ? PT Stop Time 3704   ? PT Time Calculation (min) 42 min   ? Activity Tolerance Patient tolerated treatment well   ? Behavior During Therapy [facility] for tasks assessed/performed   ? ?  ?  ? ?  ? ? ?Past Medical History:  ?Diagnosis Date  ? Anxiety   ? Arthritis   ? Hypertension   ? Spinal stenosis   ? ? ?Past Surgical History:  ?Procedure Laterality Date  ? SHOULDER ARTHROSCOPY    ? ? ?There were no vitals filed for this visit. ? ? Subjective Assessment - 09/29/21 1453   ? ? Subjective Pt. reports he felt the last work out till Sunday.  Still struggling with fatigue after stroke.  Denies new falls. Reports had to stop statin due to increased back pain and then memory problems.   ? Pertinent History history chronic LBP, stenosis, L ankle fracture, L ankle severe sprain, OA R knee   ? Diagnostic tests 08/16/21- IMPRESSION:  1. Small focus of acute ischemia within the ventral right medulla  oblongata. No hemorrhage or mass effect.  2. Advanced atrophy and chronic ischemic microangiopathy.   ? Patient Stated Goals "walk"   ? Currently in Pain? Yes   ? Pain Score 2    ? Pain Location Knee   ? Pain Orientation Right   ? ?  ?  ? ?  ? ? ? ? ? [facility] PT Assessment - 09/29/21 0001   ? ?  ? Assessment  ? Medical Diagnosis I63.329 (ICD-10-CM) - Cerebral  infarction due to thrombosis of unspecified anterior cerebral artery   ? Referring Provider (PT) [place] Breeding, Eather Colas   ? Onset Date/Surgical Date 08/14/21   ? ?  ?  ? ?  ? ? ? ? ? ? ? ? ? ? ? ? ? ? ? ? [place] Adult PT Treatment/Exercise - 09/29/21 0001   ? ?  ? Ambulation/Gait  ? Ambulation/Gait Yes   ? Ambulation/Gait Assistance 4: Min guard   ? Ambulation/Gait Assistance Details CGA for safety, cues for R heel strike   ? Ambulation Distance (Feet) 500 Feet   ? Assistive device None   ? Gait Pattern Decreased weight shift to left   ? Pre-Gait Activities at wall stepping foward and back with RLE   ?  ? Knee/Hip Exercises: Aerobic  ? Elliptical L 1 x 3 min   CGA for safety, using stationary arms  ? ?  ?  ? ?  ? ? ? ? ? ? Balance Exercises - 09/29/21 0001   ? ?  ? Balance Exercises: Standing  ? Tandem Stance Limitations   ? Tandem Stance Time semi-tandem stance x 30 sec bil, CGA for safety   ? Stepping Strategy Anterior;Lateral;10 reps;Limitations   ?  Stepping Strategy Limitations stepping 1/2 foam roller forward and to side, mirror for posture, CGA for safety.  Large LOB x 2, needing min/modA to correct.   ? ?  ?  ? ?  ? ? ? ? ? ? ? PT Short Term Goals - 09/29/21 1455   ? ?  ? PT SHORT TERM GOAL #1  ? Title Ind. with initial HEP   ? Baseline has HEP from HHPT   ? Time 2   ? Period Weeks   ? Status Achieved   ? Target Date 09/11/21   ?  ? PT SHORT TERM GOAL #2  ? Title Pt. will demonstrate improved midline orientation with no weight shift to R with standing, transfers, or walking.   ? Baseline weight shifts and leans to R   ? Time 6   ? Period Weeks   ? Status Achieved   ? Target Date 10/09/21   ?  ? PT SHORT TERM GOAL #3  ? Title Pt. will demonstrate improved L ankle eversion strength to 3/5 for safety in gait   ? Baseline 1/5 L ankle, rolls, using brace   ? Time 6   ? Period Weeks   ? Status Achieved   09/29/21- 4+/5 L ankle everson.  ? Target Date 10/09/21   ? ?  ?  ? ?  ? ? ? ? PT Long Term Goals - 09/29/21 1455   ? ?   ? PT LONG TERM GOAL #1  ? Title Pt. will be independent with progressed HEP to improve outcomes.   ? Time 12   ? Period Weeks   ? Status On-going   09/29/21- met for current.  ? Target Date 11/20/21   ?  ? PT LONG TERM GOAL #2  ? Title Pt. will be able to ambulate 1000' safely without AD for safety with community ambulation.   ? Time 12   ? Period Weeks   ? Status On-going   09/29/21- 500' without AD and CGA for safety, cues for R heel strike  ? Target Date 11/20/21   ?  ? PT LONG TERM GOAL #3  ? Title Pt. will be able to ascend/descend 12 stairs safely with 1 HR to access home.   ? Time 12   ? Period Weeks   ? Status On-going   09/22/21- can go up at home, but still difficulty descending  ? Target Date 11/20/21   ?  ? PT LONG TERM GOAL #4  ? Title Pt. will be able to maintain tandem stance x 30 sec bil to demonstrate improved balance.   ? Baseline unable to maintain tandem stance   ? Time 12   ? Period Weeks   ? Status On-going   09/29/21- semi tandem 35 sec R, 19 sec L. Unable to maintain tandem stance.  ? Target Date 11/20/21   ?  ? PT LONG TERM GOAL #5  ? Title Pt. will score at least 62% on FOTO to demonstrate improved functional mobility   ? Baseline 53% (47% disability)   ? Time 12   ? Period Weeks   ? Status Achieved   65%  ? Target Date 11/20/21   ? ?  ?  ? ?  ? ? ? ? ? ? ? ? Plan - 09/29/21 1535   ? ? Clinical Impression Statement Pt. is making good progress overall. He has met all short term goals, including L ankle eversion strength now 4+/5.  Today did note  increased tone in R ankle, despite improved strength, pulling into inversion when stepping.  His functional ability continues to improve, scoring 65% on FOTO today, meeting LTG #5.  He still has significant gait deviations and impaired balance, and would benefit from continued skilled therapy.  Today focused interventions on improving weight shift to LLE during gait, and stepping strategy.  He had large LOB today stepping over 1/2 foam roller, and  demonstrated fatigue at end of session.   ? Personal Factors and Comorbidities Comorbidity 3+   ? Comorbidities knee OA, history L ankle injuries, spinal stenosis, HTN   ? PT Frequency 2x / week   ? PT Duration 12 weeks   ? PT Treatment/Interventions ADLs/Self Care Home Management;Moist Heat;Cryotherapy;Gait training;Stair training;Functional mobility training;Therapeutic activities;Therapeutic exercise;Balance training;Neuromuscular re-education;Patient/family education;Manual techniques   ? PT Next Visit Plan continue to progress LLE/LUE strength, balance and gait   ? Consulted and Agree with Plan of Care Patient   ? ?  ?  ? ?  ? ? ?Patient will benefit from skilled therapeutic intervention in order to improve the following deficits and impairments:  Abnormal gait, Decreased endurance, Decreased strength, Decreased balance, Decreased mobility, Decreased safety awareness, Difficulty walking, Improper body mechanics, Impaired vision/preception ? ?Visit Diagnosis: ?Unsteadiness on feet ? ?Difficulty in walking, not elsewhere classified ? ?Muscle weakness (generalized) ? ? ? ? ?Problem List ?Patient Active Problem List  ? Diagnosis Date Noted  ? Cerebral thrombosis with cerebral infarction 06/17/2021  ? Essential hypertension 06/16/2021  ? Depression 06/16/2021  ? Osteoarthritis 06/16/2021  ? Insomnia 06/16/2021  ? Acute left-sided weakness 06/15/2021  ? ? ?Rennie Natter, PT, DPT  ?09/29/2021, 3:51 PM ? ?[place] ?[facility] ?[place] ?[place], [place], [address] ?Phone: [phone]   Fax:  [phone] ? ?Name: Charles Hale ?MRN: [id number] ?Date of Birth: 15-Jun-1941 ? ? ? ?

## 2021-10-01 ENCOUNTER — Other Ambulatory Visit: Payer: Self-pay

## 2021-10-01 ENCOUNTER — Ambulatory Visit: Payer: PPO

## 2021-10-01 DIAGNOSIS — R2681 Unsteadiness on feet: Secondary | ICD-10-CM | POA: Diagnosis not present

## 2021-10-01 NOTE — Therapy (Signed)
[place] ?[facility] ?[place] ?[place], [place], [address] ?Phone: [phone]   Fax:  [phone] ? ?Physical Therapy Treatment ? ?Patient Details  ?Name: Charles Hale ?MRN: [id number] ?Date of Birth: August 04, 1940 ?Referring Provider (PT): Bulla, Eather Colas ? ? ?Encounter Date: 10/01/2021 ? ? PT End of Session - 10/01/21 1532   ? ? Visit Number 11   ? Number of Visits 24   ? Date for PT Re-Evaluation 11/20/21   ? Authorization Type HT advantage   ? PT Start Time 6237   ? PT Stop Time 1528   ? PT Time Calculation (min) 41 min   ? Activity Tolerance Patient tolerated treatment well   ? Behavior During Therapy [facility] for tasks assessed/performed   ? ?  ?  ? ?  ? ? ?Past Medical History:  ?Diagnosis Date  ? Anxiety   ? Arthritis   ? Hypertension   ? Spinal stenosis   ? ? ?Past Surgical History:  ?Procedure Laterality Date  ? SHOULDER ARTHROSCOPY    ? ? ?There were no vitals filed for this visit. ? ? Subjective Assessment - 10/01/21 1455   ? ? Subjective Pt reports the big toe laying on 2nd toe as spastisity.   ? Pertinent History history chronic LBP, stenosis, L ankle fracture, L ankle severe sprain, OA R knee   ? Diagnostic tests 08/16/21- IMPRESSION:  1. Small focus of acute ischemia within the ventral right medulla  oblongata. No hemorrhage or mass effect.  2. Advanced atrophy and chronic ischemic microangiopathy.   ? Patient Stated Goals "walk"   ? Currently in Pain? No/denies   ? ?  ?  ? ?  ? ? ? ? ? ? ? ? ? ? ? ? ? ? ? ? ? ? ? ? [place] Adult PT Treatment/Exercise - 10/01/21 0001   ? ?  ? Ambulation/Gait  ? Ambulation/Gait Yes   ? Ambulation/Gait Assistance 5: Supervision;4: Min guard   ? Ambulation Distance (Feet) 400 Feet   ? Assistive device None   ? Gait Pattern Step-through pattern;Decreased stride length;Decreased weight shift to left   ? Stairs Yes   ? Stairs Assistance 4: Min guard   ? Stair Management Technique One rail Right;Step to pattern;With  cane;Alternating pattern;One rail Left   ? Number of Stairs 78   ? Height of Stairs 8   ?  ? Knee/Hip Exercises: Aerobic  ? Nustep L6x75min   ? ?  ?  ? ?  ? ? ? ? ? ? ? ? ? ? PT Education - 10/01/21 1532   ? ? Education Details edu on prolonged stretch to help with spasticity of big toe   ? Person(s) Educated Patient   ? Methods Explanation;Demonstration   ? Comprehension Verbalized understanding;Returned demonstration   ? ?  ?  ? ?  ? ? ? PT Short Term Goals - 09/29/21 1455   ? ?  ? PT SHORT TERM GOAL #1  ? Title Ind. with initial HEP   ? Baseline has HEP from HHPT   ? Time 2   ? Period Weeks   ? Status Achieved   ? Target Date 09/11/21   ?  ? PT SHORT TERM GOAL #2  ? Title Pt. will demonstrate improved midline orientation with no weight shift to R with standing, transfers, or walking.   ? Baseline weight shifts and leans to R   ? Time 6   ?  Period Weeks   ? Status Achieved   ? Target Date 10/09/21   ?  ? PT SHORT TERM GOAL #3  ? Title Pt. will demonstrate improved L ankle eversion strength to 3/5 for safety in gait   ? Baseline 1/5 L ankle, rolls, using brace   ? Time 6   ? Period Weeks   ? Status Achieved   09/29/21- 4+/5 L ankle everson.  ? Target Date 10/09/21   ? ?  ?  ? ?  ? ? ? ? PT Long Term Goals - 09/29/21 1455   ? ?  ? PT LONG TERM GOAL #1  ? Title Pt. will be independent with progressed HEP to improve outcomes.   ? Time 12   ? Period Weeks   ? Status On-going   09/29/21- met for current.  ? Target Date 11/20/21   ?  ? PT LONG TERM GOAL #2  ? Title Pt. will be able to ambulate 1000' safely without AD for safety with community ambulation.   ? Time 12   ? Period Weeks   ? Status On-going   09/29/21- 500' without AD and CGA for safety, cues for R heel strike  ? Target Date 11/20/21   ?  ? PT LONG TERM GOAL #3  ? Title Pt. will be able to ascend/descend 12 stairs safely with 1 HR to access home.   ? Time 12   ? Period Weeks   ? Status On-going   09/22/21- can go up at home, but still difficulty descending  ?  Target Date 11/20/21   ?  ? PT LONG TERM GOAL #4  ? Title Pt. will be able to maintain tandem stance x 30 sec bil to demonstrate improved balance.   ? Baseline unable to maintain tandem stance   ? Time 12   ? Period Weeks   ? Status On-going   09/29/21- semi tandem 35 sec R, 19 sec L. Unable to maintain tandem stance.  ? Target Date 11/20/21   ?  ? PT LONG TERM GOAL #5  ? Title Pt. will score at least 62% on FOTO to demonstrate improved functional mobility   ? Baseline 53% (47% disability)   ? Time 12   ? Period Weeks   ? Status Achieved   65%  ? Target Date 11/20/21   ? ?  ?  ? ?  ? ? ? ? ? ? ? ? Plan - 10/01/21 1536   ? ? Clinical Impression Statement Spent majority of session working on stairs becuase pt still has to do them at home. We did one trial of reciprocal pattern with cane and one hand rail,  still some residual weakness noted in the L LE. Provided cues and instruction on stairs to ensure safety and CGA given on stairs. Pt reported some spasticity in his L big toe which is adducted onto the 2nd toe and I showed him a way to stretch in hopes to decrease the spastisity as he feels his toes being in this position throws off his balance. Pt responded well.   ? Personal Factors and Comorbidities Comorbidity 3+   ? Comorbidities knee OA, history L ankle injuries, spinal stenosis, HTN   ? PT Frequency 2x / week   ? PT Duration 12 weeks   ? PT Treatment/Interventions ADLs/Self Care Home Management;Moist Heat;Cryotherapy;Gait training;Stair training;Functional mobility training;Therapeutic activities;Therapeutic exercise;Balance training;Neuromuscular re-education;Patient/family education;Manual techniques   ? PT Next Visit Plan continue to progress LLE/LUE strength, balance  and gait   ? Consulted and Agree with Plan of Care Patient   ? ?  ?  ? ?  ? ? ?Patient will benefit from skilled therapeutic intervention in order to improve the following deficits and impairments:  Abnormal gait, Decreased endurance,  Decreased strength, Decreased balance, Decreased mobility, Decreased safety awareness, Difficulty walking, Improper body mechanics, Impaired vision/preception ? ?Visit Diagnosis: ?Unsteadiness on feet ? ?Difficulty in walking, not elsewhere classified ? ?Muscle weakness (generalized) ? ? ? ? ?Problem List ?Patient Active Problem List  ? Diagnosis Date Noted  ? Cerebral thrombosis with cerebral infarction 06/17/2021  ? Essential hypertension 06/16/2021  ? Depression 06/16/2021  ? Osteoarthritis 06/16/2021  ? Insomnia 06/16/2021  ? Acute left-sided weakness 06/15/2021  ? ? ?Artist Pais, PTA ?10/01/2021, 4:17 PM ? ?[place] ?[facility] ?[address] [place] ?[place], [place], [address] ?Phone: [phone]   Fax:  [phone] ? ?Name: JASSIAH VIVIANO ?MRN: [id number] ?Date of Birth: [birth year] ? ? ? ?

## 2021-10-05 ENCOUNTER — Other Ambulatory Visit (HOSPITAL_COMMUNITY): Payer: Self-pay

## 2021-10-05 MED ORDER — EZETIMIBE 10 MG PO TABS
10.0000 mg | ORAL_TABLET | Freq: Every day | ORAL | 3 refills | Status: DC
  Filled 2021-10-05: qty 30, 30d supply, fill #0

## 2021-10-05 MED ORDER — ALPRAZOLAM 2 MG PO TABS
2.0000 mg | ORAL_TABLET | Freq: Every day | ORAL | 3 refills | Status: DC
  Filled 2021-10-14: qty 30, 30d supply, fill #0
  Filled 2021-11-11: qty 30, 30d supply, fill #1
  Filled 2021-12-16: qty 30, 30d supply, fill #2
  Filled 2022-01-14: qty 30, 30d supply, fill #3

## 2021-10-06 ENCOUNTER — Ambulatory Visit: Payer: PPO | Admitting: Physical Therapy

## 2021-10-06 ENCOUNTER — Encounter: Payer: Self-pay | Admitting: Physical Therapy

## 2021-10-06 ENCOUNTER — Other Ambulatory Visit: Payer: Self-pay

## 2021-10-06 DIAGNOSIS — R262 Difficulty in walking, not elsewhere classified: Secondary | ICD-10-CM

## 2021-10-06 DIAGNOSIS — R2681 Unsteadiness on feet: Secondary | ICD-10-CM | POA: Diagnosis not present

## 2021-10-06 DIAGNOSIS — M6281 Muscle weakness (generalized): Secondary | ICD-10-CM

## 2021-10-06 NOTE — Patient Instructions (Signed)
Access Code: [email or web address] ?URL: [email or web address] ?Date: 10/06/2021 ?Prepared by: Harrie Foreman ? ?Exercises ?Supine Bridge with Resistance Band - 1 x daily - 7 x weekly - 3 sets - 10 reps ?Clamshell with Resistance - 1 x daily - 7 x weekly - 3 sets - 10 reps ?Single Leg Bridge - 1 x daily - 7 x weekly - 2-3 sets - 10 reps ?Dead Bug - 1 x daily - 7 x weekly - 2-3 sets - 10 reps ?Sidelying Hip Abduction - 1 x daily - 7 x weekly - 2-3 sets - 10 reps ?Supine Lower Trunk Rotation - 1 x daily - 7 x weekly - 1 sets - 10 reps ? ?

## 2021-10-06 NOTE — Therapy (Signed)
?[facility] ?[address] ?[place], [place], [address] ?Phone: [phone]   Fax:  [phone] ? ?Physical Therapy Treatment ? ?Patient Details  ?Name: Charles Hale ?MRN: [id number] ?Date of Birth: 1940/10/24 ?Referring Provider (PT): Bulla, Eather Colas ? ? ?Encounter Date: 10/06/2021 ? ? PT End of Session - 10/06/21 1431   ? ? Visit Number 12   ? Number of Visits 24   ? Date for PT Re-Evaluation 11/20/21   ? Authorization Type HT advantage   ? Progress Note Due on Visit 20   ? PT Start Time 1432   ? PT Stop Time 1530   ? PT Time Calculation (min) 58 min   ? Activity Tolerance Patient tolerated treatment well   ? Behavior During Therapy [facility] for tasks assessed/performed   ? ?  ?  ? ?  ? ? ?Past Medical History:  ?Diagnosis Date  ? Anxiety   ? Arthritis   ? Hypertension   ? Spinal stenosis   ? ? ?Past Surgical History:  ?Procedure Laterality Date  ? SHOULDER ARTHROSCOPY    ? ? ?There were no vitals filed for this visit. ? ? Subjective Assessment - 10/06/21 1434   ? ? Subjective Pt reports having to go up and down basement stairs due to heat going out at home.   ? Pertinent History history chronic LBP, stenosis, L ankle fracture, L ankle severe sprain, OA R knee   ? Diagnostic tests 08/16/21- IMPRESSION:  1. Small focus of acute ischemia within the ventral right medulla  oblongata. No hemorrhage or mass effect.  2. Advanced atrophy and chronic ischemic microangiopathy.   ? Patient Stated Goals "walk"   ? Currently in Pain? Yes   ? Pain Score 2    ? Pain Location Back   ? Pain Orientation Right   ? ?  ?  ? ?  ? ? ? ? ? ? ? ? ? ? ? ? ? ? ? ? ? ? ? ? [place] Adult PT Treatment/Exercise - 10/06/21 0001   ? ?  ? Ambulation/Gait  ? Ambulation/Gait Yes   ? Ambulation/Gait Assistance 5: Supervision;4: Min guard   ? Ambulation/Gait Assistance Details close SBA for safety, cues for heel strike and step length   ? Ambulation Distance (Feet) 300 Feet   ? Assistive  device None   ? Gait Pattern Step-through pattern;Decreased stride length;Decreased weight shift to left   ?  ? Exercises  ? Exercises Knee/Hip   ?  ? Knee/Hip Exercises: Aerobic  ? Nustep L6x59mn   ?  ? Knee/Hip Exercises: Supine  ? Bridges Strengthening;Both;2 sets;10 reps   ? Single Leg Bridge Strengthening;Left;2 sets;10 reps   ? Straight Leg Raises Strengthening;Left;2 sets;10 reps   ? Other Supine Knee/Hip Exercises dead bugs x 10, corrected to cross midline   ? Other Supine Knee/Hip Exercises LTR to side of preference 3 x 10 sec hold   ?  ? Knee/Hip Exercises: Sidelying  ? Hip ABduction Strengthening;Left;5 reps   ? Hip ABduction Limitations very weak   ? Clams L 2 x 10 RTB   ? ?  ?  ? ?  ? ? ? ? ? ? Balance Exercises - 10/06/21 0001   ? ?  ? Balance Exercises: Standing  ? Stepping Strategy Lateral   ? Stepping Strategy Limitations side stepping over YTB with mirror for feedback and CGA for safety 2 x 10 bil   ? Tandem  Gait Forward;5 reps;Limitations   ? Tandem Gait Limitations tandem stepping on theraband, mirror for posture, minA needed   ? Other Standing Exercises suitcase carry 7# in L hand x 100' with mirror for posture and CGA for safety.   ? ?  ?  ? ?  ? ? ? ? ? ? ? PT Short Term Goals - 09/29/21 1455   ? ?  ? PT SHORT TERM GOAL #1  ? Title Ind. with initial HEP   ? Baseline has HEP from HHPT   ? Time 2   ? Period Weeks   ? Status Achieved   ? Target Date 09/11/21   ?  ? PT SHORT TERM GOAL #2  ? Title Pt. will demonstrate improved midline orientation with no weight shift to R with standing, transfers, or walking.   ? Baseline weight shifts and leans to R   ? Time 6   ? Period Weeks   ? Status Achieved   ? Target Date 10/09/21   ?  ? PT SHORT TERM GOAL #3  ? Title Pt. will demonstrate improved L ankle eversion strength to 3/5 for safety in gait   ? Baseline 1/5 L ankle, rolls, using brace   ? Time 6   ? Period Weeks   ? Status Achieved   09/29/21- 4+/5 L ankle everson.  ? Target Date 10/09/21   ? ?  ?   ? ?  ? ? ? ? PT Long Term Goals - 09/29/21 1455   ? ?  ? PT LONG TERM GOAL #1  ? Title Pt. will be independent with progressed HEP to improve outcomes.   ? Time 12   ? Period Weeks   ? Status On-going   09/29/21- met for current.  ? Target Date 11/20/21   ?  ? PT LONG TERM GOAL #2  ? Title Pt. will be able to ambulate 1000' safely without AD for safety with community ambulation.   ? Time 12   ? Period Weeks   ? Status On-going   09/29/21- 500' without AD and CGA for safety, cues for R heel strike  ? Target Date 11/20/21   ?  ? PT LONG TERM GOAL #3  ? Title Pt. will be able to ascend/descend 12 stairs safely with 1 HR to access home.   ? Time 12   ? Period Weeks   ? Status On-going   09/22/21- can go up at home, but still difficulty descending  ? Target Date 11/20/21   ?  ? PT LONG TERM GOAL #4  ? Title Pt. will be able to maintain tandem stance x 30 sec bil to demonstrate improved balance.   ? Baseline unable to maintain tandem stance   ? Time 12   ? Period Weeks   ? Status On-going   09/29/21- semi tandem 35 sec R, 19 sec L. Unable to maintain tandem stance.  ? Target Date 11/20/21   ?  ? PT LONG TERM GOAL #5  ? Title Pt. will score at least 62% on FOTO to demonstrate improved functional mobility   ? Baseline 53% (47% disability)   ? Time 12   ? Period Weeks   ? Status Achieved   65%  ? Target Date 11/20/21   ? ?  ?  ? ?  ? ? ? ? ? ? ? ? Plan - 10/06/21 1543   ? ? Clinical Impression Statement Mr. Naill reports doing stairs at home, no difficulty with  basement stairs but still challenged with upper floor stairs, but did not wish to work on today.  Focused today on improving balance, weight shift to LLE, and also core/LLE strengthening.   He is still challenged with weight shift to LLE, especially without his cane, but improved when asked to increase gait speed, but fatigued quickly.  He needed frequent cues with tandem walking to engage L obliques to correct lean to R.  We also reviewed supine core exercises and  advanced to increase LLE strengthening with single leg bridges.  He would benefit from continued skilled therapy.   ? ?  ?  ? ?  ? ? ?Patient will benefit from skilled therapeutic intervention in order to improve the following deficits and impairments:    ? ?Visit Diagnosis: ?Unsteadiness on feet ? ?Difficulty in walking, not elsewhere classified ? ?Muscle weakness (generalized) ? ? ? ? ?Problem List ?Patient Active Problem List  ? Diagnosis Date Noted  ? Cerebral thrombosis with cerebral infarction 06/17/2021  ? Essential hypertension 06/16/2021  ? Depression 06/16/2021  ? Osteoarthritis 06/16/2021  ? Insomnia 06/16/2021  ? Acute left-sided weakness 06/15/2021  ? ? ?Rennie Natter, PT, DPT  ?10/06/2021, 7:06 PM ? ? ?[facility] ?[place] [place] ?[place], [place], [address] ?Phone: [phone]   Fax:  [phone] ? ?Name: SALAR MOLDEN ?MRN: [id number] ?Date of Birth: Oct 08, 1940 ? ? ? ?

## 2021-10-08 ENCOUNTER — Other Ambulatory Visit: Payer: Self-pay

## 2021-10-08 ENCOUNTER — Ambulatory Visit: Payer: PPO

## 2021-10-08 DIAGNOSIS — R2681 Unsteadiness on feet: Secondary | ICD-10-CM | POA: Diagnosis not present

## 2021-10-08 DIAGNOSIS — M6281 Muscle weakness (generalized): Secondary | ICD-10-CM

## 2021-10-08 DIAGNOSIS — R262 Difficulty in walking, not elsewhere classified: Secondary | ICD-10-CM

## 2021-10-08 NOTE — Therapy (Signed)
[place] ?[facility] ?[address] ?[place], [place], [address] ?Phone: [phone]   Fax:  [phone] ? ?Physical Therapy Treatment ? ?Patient Details  ?Name: Charles Hale ?MRN: [id number] ?Date of Birth: [birth year] ?Referring Provider (PT): Bulla, Eather Colas ? ? ?Encounter Date: 10/08/2021 ? ? PT End of Session - 10/08/21 1527   ? ? Visit Number 13   ? Number of Visits 24   ? Date for PT Re-Evaluation 11/20/21   ? Authorization Type HT advantage   ? Progress Note Due on Visit 20   ? PT Start Time 1448   ? PT Stop Time 1528   ? PT Time Calculation (min) 40 min   ? Activity Tolerance Patient tolerated treatment well   ? Behavior During Therapy [facility] for tasks assessed/performed   ? ?  ?  ? ?  ? ? ?Past Medical History:  ?Diagnosis Date  ? Anxiety   ? Arthritis   ? Hypertension   ? Spinal stenosis   ? ? ?Past Surgical History:  ?Procedure Laterality Date  ? SHOULDER ARTHROSCOPY    ? ? ?There were no vitals filed for this visit. ? ? Subjective Assessment - 10/08/21 1451   ? ? Subjective Pt reports having lots of trips up and down stairs because his furnace went out at home.   ? Pertinent History history chronic LBP, stenosis, L ankle fracture, L ankle severe sprain, OA R knee   ? Diagnostic tests 08/16/21- IMPRESSION:  1. Small focus of acute ischemia within the ventral right medulla  oblongata. No hemorrhage or mass effect.  2. Advanced atrophy and chronic ischemic microangiopathy.   ? Patient Stated Goals "walk"   ? Currently in Pain? No/denies   ? ?  ?  ? ?  ? ? ? ? ? ? ? ? ? ? ? ? ? ? ? ? ? ? ? ? [place] Adult PT Treatment/Exercise - 10/08/21 0001   ? ?  ? Knee/Hip Exercises: Aerobic  ? Nustep L7x40min   ?  ? Knee/Hip Exercises: Seated  ? Long [employer] Strengthening;Both;2 sets;10 reps;Weights   ? Long Arc Quad Weight 3 lbs.   ? Marching Strengthening;Both;2 sets;10 reps;Weights   ? Marching Weights 3 lbs.   ? Sit to Sand 10 reps;with UE support   squat with  bottom tap to chair with 2 foam rolls  ?  ? Shoulder Exercises: Seated  ? Horizontal ABduction Strengthening;Both;10 reps;Theraband   ? Theraband Level (Shoulder Horizontal ABduction) Level 3 (Green)   ? External Rotation Strengthening;Both;10 reps;Theraband   ? Theraband Level (Shoulder External Rotation) Level 3 (Green)   ? Other Seated Exercises pallof press GTB 2x10 R/L   ?  ? Shoulder Exercises: Standing  ? Horizontal ABduction Strengthening;Both;10 reps;Theraband   2 set  ? Theraband Level (Shoulder Horizontal ABduction) Level 3 (Green)   ? External Rotation Strengthening;Both;10 reps;Theraband   ? Theraband Level (Shoulder External Rotation) Level 3 (Green)   ? Other Standing Exercises shoulder press 5lb x15 reps Bil   ? ?  ?  ? ?  ? ? ? ? ? ? ? ? ? ? ? ? PT Short Term Goals - 09/29/21 1455   ? ?  ? PT SHORT TERM GOAL #1  ? Title Ind. with initial HEP   ? Baseline has HEP from HHPT   ? Time 2   ? Period Weeks   ? Status Achieved   ? Target Date 09/11/21   ?  ?  PT SHORT TERM GOAL #2  ? Title Pt. will demonstrate improved midline orientation with no weight shift to R with standing, transfers, or walking.   ? Baseline weight shifts and leans to R   ? Time 6   ? Period Weeks   ? Status Achieved   ? Target Date 10/09/21   ?  ? PT SHORT TERM GOAL #3  ? Title Pt. will demonstrate improved L ankle eversion strength to 3/5 for safety in gait   ? Baseline 1/5 L ankle, rolls, using brace   ? Time 6   ? Period Weeks   ? Status Achieved   09/29/21- 4+/5 L ankle everson.  ? Target Date 10/09/21   ? ?  ?  ? ?  ? ? ? ? PT Long Term Goals - 09/29/21 1455   ? ?  ? PT LONG TERM GOAL #1  ? Title Pt. will be independent with progressed HEP to improve outcomes.   ? Time 12   ? Period Weeks   ? Status On-going   09/29/21- met for current.  ? Target Date 11/20/21   ?  ? PT LONG TERM GOAL #2  ? Title Pt. will be able to ambulate 1000' safely without AD for safety with community ambulation.   ? Time 12   ? Period Weeks   ? Status  On-going   09/29/21- 500' without AD and CGA for safety, cues for R heel strike  ? Target Date 11/20/21   ?  ? PT LONG TERM GOAL #3  ? Title Pt. will be able to ascend/descend 12 stairs safely with 1 HR to access home.   ? Time 12   ? Period Weeks   ? Status On-going   09/22/21- can go up at home, but still difficulty descending  ? Target Date 11/20/21   ?  ? PT LONG TERM GOAL #4  ? Title Pt. will be able to maintain tandem stance x 30 sec bil to demonstrate improved balance.   ? Baseline unable to maintain tandem stance   ? Time 12   ? Period Weeks   ? Status On-going   09/29/21- semi tandem 35 sec R, 19 sec L. Unable to maintain tandem stance.  ? Target Date 11/20/21   ?  ? PT LONG TERM GOAL #5  ? Title Pt. will score at least 62% on FOTO to demonstrate improved functional mobility   ? Baseline 53% (47% disability)   ? Time 12   ? Period Weeks   ? Status Achieved   65%  ? Target Date 11/20/21   ? ?  ?  ? ?  ? ? ? ? ? ? ? ? Plan - 10/08/21 1530   ? ? Clinical Impression Statement Pt was requesting to work more on UE strength today. Cues required for ecc control with seated marches and LAQ. Postural cues needed with seated LE exercises as well. He reports more ease with navigating stairs as he has been doing them a lot lately. Worked on postural strengthening and stabilization in WB to improve lumbopelvic control and upright stability. Pt is progressing well toward goals.   ? Personal Factors and Comorbidities Comorbidity 3+   ? Comorbidities knee OA, history L ankle injuries, spinal stenosis, HTN   ? PT Frequency 2x / week   ? PT Duration 12 weeks   ? PT Treatment/Interventions ADLs/Self Care Home Management;Moist Heat;Cryotherapy;Gait training;Stair training;Functional mobility training;Therapeutic activities;Therapeutic exercise;Balance training;Neuromuscular re-education;Patient/family education;Manual techniques   ?  PT Next Visit Plan continue to progress LLE/LUE strength, balance and gait   ? Consulted and Agree  with Plan of Care Patient   ? ?  ?  ? ?  ? ? ?Patient will benefit from skilled therapeutic intervention in order to improve the following deficits and impairments:  Abnormal gait, Decreased endurance, Decreased strength, Decreased balance, Decreased mobility, Decreased safety awareness, Difficulty walking, Improper body mechanics, Impaired vision/preception ? ?Visit Diagnosis: ?Unsteadiness on feet ? ?Difficulty in walking, not elsewhere classified ? ?Muscle weakness (generalized) ? ? ? ? ?Problem List ?Patient Active Problem List  ? Diagnosis Date Noted  ? Cerebral thrombosis with cerebral infarction 06/17/2021  ? Essential hypertension 06/16/2021  ? Depression 06/16/2021  ? Osteoarthritis 06/16/2021  ? Insomnia 06/16/2021  ? Acute left-sided weakness 06/15/2021  ? ? ?Artist Pais, PTA ?10/08/2021, 3:36 PM ? ?[place] ?[facility] ?[place] ?[place], [place], [address] ?Phone: [phone]   Fax:  [phone] ? ?Name: Charles Hale ?MRN: [id number] ?Date of Birth: [birth year] ? ? ? ?

## 2021-10-13 ENCOUNTER — Encounter: Payer: Self-pay | Admitting: Physical Therapy

## 2021-10-13 ENCOUNTER — Other Ambulatory Visit: Payer: Self-pay

## 2021-10-13 ENCOUNTER — Ambulatory Visit: Payer: PPO | Admitting: Physical Therapy

## 2021-10-13 DIAGNOSIS — R262 Difficulty in walking, not elsewhere classified: Secondary | ICD-10-CM

## 2021-10-13 DIAGNOSIS — R2681 Unsteadiness on feet: Secondary | ICD-10-CM | POA: Diagnosis not present

## 2021-10-13 DIAGNOSIS — M6281 Muscle weakness (generalized): Secondary | ICD-10-CM

## 2021-10-13 NOTE — Therapy (Signed)
[place] ?[facility] ?[address] ?[place], [place], [address] ?Phone: [phone]   Fax:  [phone] ? ?Physical Therapy Treatment ? ?Patient Details  ?Name: Charles Hale ?MRN: [id number] ?Date of Birth: 03/14/1941 ?Referring Provider (PT): Bulla, Eather Colas ? ? ?Encounter Date: 10/13/2021 ? ? PT End of Session - 10/13/21 1500   ? ? Visit Number 14   ? Number of Visits 24   ? Date for PT Re-Evaluation 11/20/21   ? Authorization Type HT advantage   ? Progress Note Due on Visit 20   ? PT Start Time 1455   ? PT Stop Time 1534   ? PT Time Calculation (min) 39 min   ? Activity Tolerance Patient tolerated treatment well   ? Behavior During Therapy [facility] for tasks assessed/performed   ? ?  ?  ? ?  ? ? ?Past Medical History:  ?Diagnosis Date  ? Anxiety   ? Arthritis   ? Hypertension   ? Spinal stenosis   ? ? ?Past Surgical History:  ?Procedure Laterality Date  ? SHOULDER ARTHROSCOPY    ? ? ?There were no vitals filed for this visit. ? ? Subjective Assessment - 10/13/21 1459   ? ? Subjective Patient reports overdoing this weekend walking and exercising in basement, so sore and feeling back more today.   Still having trouble going from second floor to first floor, the first landing is difficult.   ? Pertinent History history chronic LBP, stenosis, L ankle fracture, L ankle severe sprain, OA R knee   ? Diagnostic tests 08/16/21- IMPRESSION:  1. Small focus of acute ischemia within the ventral right medulla  oblongata. No hemorrhage or mass effect.  2. Advanced atrophy and chronic ischemic microangiopathy.   ? Patient Stated Goals "walk"   ? Currently in Pain? Yes   ? Pain Score 3    ? Pain Location Back   ? ?  ?  ? ?  ? ? ? ? ? ? ? ? ? ? ? ? ? ? ? ? ? ? ? ? [place] Adult PT Treatment/Exercise - 10/13/21 0001   ? ?  ? Ambulation/Gait  ? Ambulation/Gait Yes   ? Ambulation/Gait Assistance 4: Min guard   ? Ambulation/Gait Assistance Details CGA for safety, LOB x 2 needing  minA to correct   ? Ambulation Distance (Feet) 300 Feet   ? Assistive device None   ? Gait Pattern Step-through pattern;Decreased stride length;Decreased weight shift to left   ? Gait Comments side stepping 4 x 8' bil with CGA for safety and no UE support - 2 small LOB.  Gait end of sessoin 180' with SPC, cues for R heel strike and to increase stride on R   ?  ? Therapeutic Activites   ? Therapeutic Activities Other Therapeutic Activities   ? Other Therapeutic Activities pushing sled 250' to simulate mowing lawn, repeated additional 180' with handle lower and 14# weight added to front of sled, close SBA throughout, for posterior chain strengthening and hip extension   ?  ? Knee/Hip Exercises: Aerobic  ? Nustep L7x37mn   ? ?  ?  ? ?  ? ? ? ? ? ? ? ? ? ? ? ? PT Short Term Goals - 09/29/21 1455   ? ?  ? PT SHORT TERM GOAL #1  ? Title Ind. with initial HEP   ? Baseline has HEP from HHPT   ? Time 2   ?  Period Weeks   ? Status Achieved   ? Target Date 09/11/21   ?  ? PT SHORT TERM GOAL #2  ? Title Pt. will demonstrate improved midline orientation with no weight shift to R with standing, transfers, or walking.   ? Baseline weight shifts and leans to R   ? Time 6   ? Period Weeks   ? Status Achieved   ? Target Date 10/09/21   ?  ? PT SHORT TERM GOAL #3  ? Title Pt. will demonstrate improved L ankle eversion strength to 3/5 for safety in gait   ? Baseline 1/5 L ankle, rolls, using brace   ? Time 6   ? Period Weeks   ? Status Achieved   09/29/21- 4+/5 L ankle everson.  ? Target Date 10/09/21   ? ?  ?  ? ?  ? ? ? ? PT Long Term Goals - 09/29/21 1455   ? ?  ? PT LONG TERM GOAL #1  ? Title Pt. will be independent with progressed HEP to improve outcomes.   ? Time 12   ? Period Weeks   ? Status On-going   09/29/21- met for current.  ? Target Date 11/20/21   ?  ? PT LONG TERM GOAL #2  ? Title Pt. will be able to ambulate 1000' safely without AD for safety with community ambulation.   ? Time 12   ? Period Weeks   ? Status On-going    09/29/21- 500' without AD and CGA for safety, cues for R heel strike  ? Target Date 11/20/21   ?  ? PT LONG TERM GOAL #3  ? Title Pt. will be able to ascend/descend 12 stairs safely with 1 HR to access home.   ? Time 12   ? Period Weeks   ? Status On-going   09/22/21- can go up at home, but still difficulty descending  ? Target Date 11/20/21   ?  ? PT LONG TERM GOAL #4  ? Title Pt. will be able to maintain tandem stance x 30 sec bil to demonstrate improved balance.   ? Baseline unable to maintain tandem stance   ? Time 12   ? Period Weeks   ? Status On-going   09/29/21- semi tandem 35 sec R, 19 sec L. Unable to maintain tandem stance.  ? Target Date 11/20/21   ?  ? PT LONG TERM GOAL #5  ? Title Pt. will score at least 62% on FOTO to demonstrate improved functional mobility   ? Baseline 53% (47% disability)   ? Time 12   ? Period Weeks   ? Status Achieved   65%  ? Target Date 11/20/21   ? ?  ?  ? ?  ? ? ? ? ? ? ? ? Plan - 10/13/21 1626   ? ? Clinical Impression Statement Focus of session today was on functional strengthening pushing sled for both UE strengthening and posterior chain strengthening, as well as gait training, especially side stepping, as noted difficulty transitioning from table to sled very unsure when not supported.  He demonstrated improved R heel strike and stride length at end of session with cane, without AD still very unsteady.  He would benefit from continued skilled therapy.   ? Personal Factors and Comorbidities Comorbidity 3+   ? Comorbidities knee OA, history L ankle injuries, spinal stenosis, HTN   ? PT Frequency 2x / week   ? PT Duration 12 weeks   ?  PT Treatment/Interventions ADLs/Self Care Home Management;Moist Heat;Cryotherapy;Gait training;Stair training;Functional mobility training;Therapeutic activities;Therapeutic exercise;Balance training;Neuromuscular re-education;Patient/family education;Manual techniques   ? PT Next Visit Plan continue to progress LLE/LUE strength, balance and gait    ? Consulted and Agree with Plan of Care Patient   ? ?  ?  ? ?  ? ? ?Patient will benefit from skilled therapeutic intervention in order to improve the following deficits and impairments:  Abnormal gait, Decreased endurance, Decreased strength, Decreased balance, Decreased mobility, Decreased safety awareness, Difficulty walking, Improper body mechanics, Impaired vision/preception ? ?Visit Diagnosis: ?Unsteadiness on feet ? ?Difficulty in walking, not elsewhere classified ? ?Muscle weakness (generalized) ? ? ? ? ?Problem List ?Patient Active Problem List  ? Diagnosis Date Noted  ? Cerebral thrombosis with cerebral infarction 06/17/2021  ? Essential hypertension 06/16/2021  ? Depression 06/16/2021  ? Osteoarthritis 06/16/2021  ? Insomnia 06/16/2021  ? Acute left-sided weakness 06/15/2021  ? ? ?Rennie Natter, PT, DPT  ?10/13/2021, 4:29 PM ? ?[place] ?[facility] ?[address] ?[place], [place], [address] ?Phone: [phone]   Fax:  [phone] ? ?Name: Charles Hale ?MRN: [id number] ?Date of Birth: 04/12/1941 ? ? ? ?

## 2021-10-14 ENCOUNTER — Other Ambulatory Visit (HOSPITAL_COMMUNITY): Payer: Self-pay

## 2021-10-21 ENCOUNTER — Ambulatory Visit: Payer: PPO | Attending: Internal Medicine | Admitting: Physical Therapy

## 2021-10-21 DIAGNOSIS — M6281 Muscle weakness (generalized): Secondary | ICD-10-CM | POA: Diagnosis present

## 2021-10-21 DIAGNOSIS — R2681 Unsteadiness on feet: Secondary | ICD-10-CM | POA: Insufficient documentation

## 2021-10-21 DIAGNOSIS — R262 Difficulty in walking, not elsewhere classified: Secondary | ICD-10-CM | POA: Diagnosis present

## 2021-10-21 NOTE — Therapy (Signed)
[facility] ?[facility] ?[address] ?[place], [place], [address] ?Phone: [phone]   Fax:  [phone] ? ?Physical Therapy Treatment ? ?Patient Details  ?Name: Charles Hale ?MRN: [id number] ?Date of Birth: 1941/06/27 ?Referring Provider (PT): Bulla, Eather Colas ? ? ?Encounter Date: 10/21/2021 ? ? PT End of Session - 10/21/21 1450   ? ? Visit Number 15   ? Number of Visits 24   ? Date for PT Re-Evaluation 11/20/21   ? Authorization Type HT advantage   ? Progress Note Due on Visit 20   ? PT Start Time 4403   ? PT Stop Time 1530   ? PT Time Calculation (min) 45 min   ? Equipment Utilized During Treatment Gait belt   ? Activity Tolerance Patient tolerated treatment well   ? Behavior During Therapy [facility] for tasks assessed/performed   ? ?  ?  ? ?  ? ? ?Past Medical History:  ?Diagnosis Date  ? Anxiety   ? Arthritis   ? Hypertension   ? Spinal stenosis   ? ? ?Past Surgical History:  ?Procedure Laterality Date  ? SHOULDER ARTHROSCOPY    ? ? ?There were no vitals filed for this visit. ? ? Subjective Assessment - 10/21/21 1449   ? ? Subjective Patient having a lot of trouble with numbness going down his R leg from his back, if he moves wrong.  Seems to be more of a problem last few days, frustrated as it is interfering with his progress.  Worse after going upstairs and doing exercises on total gym.  Has had chronic pain now for several years.  Doing better on stairs at home.   ? Pertinent History history chronic LBP, stenosis, L ankle fracture, L ankle severe sprain, OA R knee   ? Diagnostic tests 08/16/21- IMPRESSION:  1. Small focus of acute ischemia within the ventral right medulla  oblongata. No hemorrhage or mass effect.  2. Advanced atrophy and chronic ischemic microangiopathy.   ? Patient Stated Goals "walk"   ? Currently in Pain? Yes   ? Pain Score 5    ? Pain Location Leg   ? Pain Orientation Right   ? Pain Descriptors / Indicators Numbness   ? ?  ?  ? ?   ? ? ? ? ? ? ? ? ? ? ? ? ? ? ? ? ? ? ? ? [facility] Adult PT Treatment/Exercise - 10/21/21 0001   ? ?  ? Ambulation/Gait  ? Ambulation/Gait Yes   ? Ambulation/Gait Assistance 4: Min guard   ? Ambulation/Gait Assistance Details CGA for safety   ? Ambulation Distance (Feet) 300 Feet   ? Assistive device Straight cane   ? Gait Pattern Step-through pattern;Decreased weight shift to left   ? Gait velocity 0.65m/s   ? Pre-Gait Activities leaning with L side against wall to maintain w/s to LLE, stepping foward and back with RLE 3 x 10   ?  ? Knee/Hip Exercises: Aerobic  ? Nustep L7x81min   ? ?  ?  ? ?  ? ? ? ? ? ? Balance Exercises - 10/21/21 0001   ? ?  ? Balance Exercises: Standing  ? Tandem Stance Eyes closed;3 reps;Limitations   ? Tandem Stance Time x 5 L, x 15 R, CGA for safety   ? SLS Eyes open;3 reps;Limitations   ? SLS Limitations x 5 sec bil, CGA for safety, reports can hold longer at home, limited due  to muscle fatigue   ? Standing, One Foot on a Step Eyes open;Limitations   ? Standing, One Foot on a Step Limitations toe taps on yoga block x 10 bil starting on flat side, then turned to side repeated x 10 bil.   ? Stepping Strategy Anterior;Limitations   ? Stepping Strategy Limitations stepping over and back 1/2 foam roller 2 x 10 bil, CGA for safety, improving coordination with LLE, difficulty stepping with RLE due to decreased L weight shift.   ? ?  ?  ? ?  ? ? ? ? ? ? ? PT Short Term Goals - 09/29/21 1455   ? ?  ? PT SHORT TERM GOAL #1  ? Title Ind. with initial HEP   ? Baseline has HEP from HHPT   ? Time 2   ? Period Weeks   ? Status Achieved   ? Target Date 09/11/21   ?  ? PT SHORT TERM GOAL #2  ? Title Pt. will demonstrate improved midline orientation with no weight shift to R with standing, transfers, or walking.   ? Baseline weight shifts and leans to R   ? Time 6   ? Period Weeks   ? Status Achieved   ? Target Date 10/09/21   ?  ? PT SHORT TERM GOAL #3  ? Title Pt. will demonstrate improved L ankle eversion  strength to 3/5 for safety in gait   ? Baseline 1/5 L ankle, rolls, using brace   ? Time 6   ? Period Weeks   ? Status Achieved   09/29/21- 4+/5 L ankle everson.  ? Target Date 10/09/21   ? ?  ?  ? ?  ? ? ? ? PT Long Term Goals - 10/21/21 1529   ? ?  ? PT LONG TERM GOAL #1  ? Title Pt. will be independent with progressed HEP to improve outcomes.   ? Time 12   ? Period Weeks   ? Status On-going   09/29/21- met for current.  ? Target Date 11/20/21   ?  ? PT LONG TERM GOAL #2  ? Title Pt. will be able to ambulate 1000' safely without AD for safety with community ambulation.   ? Time 12   ? Period Weeks   ? Status On-going   09/29/21- 500' without AD and CGA for safety, cues for R heel strike  ? Target Date 11/20/21   ?  ? PT LONG TERM GOAL #3  ? Title Pt. will be able to ascend/descend 12 stairs safely with 1 HR to access home.   ? Time 12   ? Period Weeks   ? Status On-going   09/22/21- can go up at home, but still difficulty descending  ? Target Date 11/20/21   ?  ? PT LONG TERM GOAL #4  ? Title Pt. will be able to maintain tandem stance x 30 sec bil to demonstrate improved balance.   ? Baseline unable to maintain tandem stance   ? Time 12   ? Period Weeks   ? Status On-going   09/29/21- semi tandem 35 sec R, 19 sec L. Unable to maintain tandem stance.  10/21/21- 15 sec R foot forward, 5 sec L foot foward tandem  ? Target Date 11/20/21   ?  ? PT LONG TERM GOAL #5  ? Title Pt. will score at least 62% on FOTO to demonstrate improved functional mobility   ? Baseline 53% (47% disability)   ? Time 12   ?  Period Weeks   ? Status Achieved   65%  ? Target Date 11/20/21   ? ?  ?  ? ?  ? ? ? ? ? ? ? ? Plan - 10/21/21 1629   ? ? Clinical Impression Statement Mr. Marxen is making good progress, demonstrating improving gait speed today (0.5 m/s), improved stride length and heel strike bil, but still occasionally unsteady.  He also demonstrates improved balance, able to maintain tandem stance x 15 sec with R foot foward and 5 sec L foot  foward.  He is also demonstrating improved LLE coordination, able to step over and back 1/2 foam roller easily today.  He is having difficulty due to radicular symptoms causing numbness in R sometimes both legs.  Today continued to focus on balance and gait, tolerated well overall.  He would benefit from continued skilled therapy.   ? Personal Factors and Comorbidities Comorbidity 3+   ? Comorbidities knee OA, history L ankle injuries, spinal stenosis, HTN   ? PT Frequency 2x / week   ? PT Duration 12 weeks   ? PT Treatment/Interventions ADLs/Self Care Home Management;Moist Heat;Cryotherapy;Gait training;Stair training;Functional mobility training;Therapeutic activities;Therapeutic exercise;Balance training;Neuromuscular re-education;Patient/family education;Manual techniques   ? PT Next Visit Plan continue to progress LLE/LUE strength, balance and gait   ? Consulted and Agree with Plan of Care Patient   ? ?  ?  ? ?  ? ? ?Patient will benefit from skilled therapeutic intervention in order to improve the following deficits and impairments:  Abnormal gait, Decreased endurance, Decreased strength, Decreased balance, Decreased mobility, Decreased safety awareness, Difficulty walking, Improper body mechanics, Impaired vision/preception ? ?Visit Diagnosis: ?Unsteadiness on feet ? ?Difficulty in walking, not elsewhere classified ? ?Muscle weakness (generalized) ? ? ? ? ?Problem List ?Patient Active Problem List  ? Diagnosis Date Noted  ? Cerebral thrombosis with cerebral infarction 06/17/2021  ? Essential hypertension 06/16/2021  ? Depression 06/16/2021  ? Osteoarthritis 06/16/2021  ? Insomnia 06/16/2021  ? Acute left-sided weakness 06/15/2021  ? ? ?Rennie Natter, PT, DPT  ?10/21/2021, 4:41 PM ? ?[place] [facility] ?[address] ?[place], [place], [address] ?Phone: [phone]   Fax:  [phone] ? ?Name: MARSEAN ELKHATIB ?MRN: [id number] ?Date of Birth:  13-Jan-1941 ? ? ? ?

## 2021-10-27 ENCOUNTER — Ambulatory Visit: Payer: PPO | Admitting: Physical Therapy

## 2021-10-27 ENCOUNTER — Encounter: Payer: Self-pay | Admitting: Physical Therapy

## 2021-10-27 DIAGNOSIS — R2681 Unsteadiness on feet: Secondary | ICD-10-CM

## 2021-10-27 DIAGNOSIS — R262 Difficulty in walking, not elsewhere classified: Secondary | ICD-10-CM

## 2021-10-27 DIAGNOSIS — M6281 Muscle weakness (generalized): Secondary | ICD-10-CM

## 2021-10-27 NOTE — Therapy (Signed)
[facility] ?[facility] ?[address] ?[place], [place], [address] ?Phone: [phone]   Fax:  [phone] ? ?Physical Therapy Treatment ? ?Patient Details  ?Name: Charles Hale ?MRN: [id number] ?Date of Birth: 02/03/1941 ?Referring Provider (PT): Bulla, Eather Colas ? ? ?Encounter Date: 10/27/2021 ? ? PT End of Session - 10/27/21 1546   ? ? Visit Number 16   ? Number of Visits 24   ? Date for PT Re-Evaluation 11/20/21   ? Authorization Type HT advantage   ? Progress Note Due on Visit 20   ? PT Start Time 1751   ? PT Stop Time 1530   ? PT Time Calculation (min) 43 min   ? Equipment Utilized During Treatment Gait belt   ? Activity Tolerance Patient tolerated treatment well   ? Behavior During Therapy [facility] for tasks assessed/performed   ? ?  ?  ? ?  ? ? ?Past Medical History:  ?Diagnosis Date  ? Anxiety   ? Arthritis   ? Hypertension   ? Spinal stenosis   ? ? ?Past Surgical History:  ?Procedure Laterality Date  ? SHOULDER ARTHROSCOPY    ? ? ?There were no vitals filed for this visit. ? ? Subjective Assessment - 10/27/21 1451   ? ? Subjective Pt. reports he has been having excrutiating pain over the last week, in R SIJ/lower back region.  Not sure what set it off, he has been doing more around the house, bought a vacuum cleaner and started using it.  Has been looking on internet and found description of sacroiliitis which he says matches his symptoms.   ? Pertinent History history chronic LBP, stenosis, L ankle fracture, L ankle severe sprain, OA R knee   ? Diagnostic tests 08/16/21- IMPRESSION:  1. Small focus of acute ischemia within the ventral right medulla  oblongata. No hemorrhage or mass effect.  2. Advanced atrophy and chronic ischemic microangiopathy.   ? Patient Stated Goals "walk"   ? Currently in Pain? No/denies   pain after prolonged sitting for 1 hour, increases when standing up, 7-8/10, can walk it off after 10 min.  ? Pain Location Buttocks   ? Pain  Orientation Right   ? ?  ?  ? ?  ? ? ? ? ? ? ? ? ? ? ? ? ? ? ? ? ? ? ? ? [facility] Adult PT Treatment/Exercise - 10/27/21 0001   ? ?  ? Knee/Hip Exercises: Aerobic  ? Nustep L7x63min   ?  ? Knee/Hip Exercises: Supine  ? Hip Adduction Isometric Strengthening;Both;10 reps   ? Hip Adduction Isometric Limitations 5 sec hold with ball/alternating with isometic hip abduction with 6 sec hold (therapist resistance)   ? Bridges Strengthening;Both;5 reps   ? Bridges Limitations increased pain with return R SIJ   ? Other Supine Knee/Hip Exercises resisted R hip extension/flexion isometric holds in supine hooklying 10 x 6 sec holds   ?  ? Knee/Hip Exercises: Sidelying  ? Clams R no resistance x 20   ?  ? Knee/Hip Exercises: Prone  ? Hip Extension Strengthening;Right;10 reps   ? Hip Extension Limitations increased pain when performed on L in R SIJ so focused on R today.   ?  ? Manual Therapy  ? Manual Therapy Soft tissue mobilization;Joint mobilization;Myofascial release   ? Manual therapy comments to R side low back/SIJ to decrease muscle spasm and pain   ? Joint Mobilization UPA mobs lumbar spine,  sacral mobs, noted hypomobility throughout   ? Soft tissue mobilization IASTM with foam roller to R glutes/proximal hamstrings/QL   ? Myofascial Release TPR R glut medius   ? ?  ?  ? ?  ? ? ? ? ? ? ? ? ? ? PT Education - 10/27/21 1601   ? ? Education Details HEP for R glut strengthening to decrease SIJ pain. Access Code: [email or web address]   ? Person(s) Educated Patient   ? Methods Explanation;Demonstration;Verbal cues;Handout   ? Comprehension Verbalized understanding;Returned demonstration   ? ?  ?  ? ?  ? ? ? PT Short Term Goals - 09/29/21 1455   ? ?  ? PT SHORT TERM GOAL #1  ? Title Ind. with initial HEP   ? Baseline has HEP from HHPT   ? Time 2   ? Period Weeks   ? Status Achieved   ? Target Date 09/11/21   ?  ? PT SHORT TERM GOAL #2  ? Title Pt. will demonstrate improved midline orientation with no weight shift to R with standing, transfers,  or walking.   ? Baseline weight shifts and leans to R   ? Time 6   ? Period Weeks   ? Status Achieved   ? Target Date 10/09/21   ?  ? PT SHORT TERM GOAL #3  ? Title Pt. will demonstrate improved L ankle eversion strength to 3/5 for safety in gait   ? Baseline 1/5 L ankle, rolls, using brace   ? Time 6   ? Period Weeks   ? Status Achieved   09/29/21- 4+/5 L ankle everson.  ? Target Date 10/09/21   ? ?  ?  ? ?  ? ? ? ? PT Long Term Goals - 10/21/21 1529   ? ?  ? PT LONG TERM GOAL #1  ? Title Pt. will be independent with progressed HEP to improve outcomes.   ? Time 12   ? Period Weeks   ? Status On-going   09/29/21- met for current.  ? Target Date 11/20/21   ?  ? PT LONG TERM GOAL #2  ? Title Pt. will be able to ambulate 1000' safely without AD for safety with community ambulation.   ? Time 12   ? Period Weeks   ? Status On-going   09/29/21- 500' without AD and CGA for safety, cues for R heel strike  ? Target Date 11/20/21   ?  ? PT LONG TERM GOAL #3  ? Title Pt. will be able to ascend/descend 12 stairs safely with 1 HR to access home.   ? Time 12   ? Period Weeks   ? Status On-going   09/22/21- can go up at home, but still difficulty descending  ? Target Date 11/20/21   ?  ? PT LONG TERM GOAL #4  ? Title Pt. will be able to maintain tandem stance x 30 sec bil to demonstrate improved balance.   ? Baseline unable to maintain tandem stance   ? Time 12   ? Period Weeks   ? Status On-going   09/29/21- semi tandem 35 sec R, 19 sec L. Unable to maintain tandem stance.  10/21/21- 15 sec R foot forward, 5 sec L foot foward tandem  ? Target Date 11/20/21   ?  ? PT LONG TERM GOAL #5  ? Title Pt. will score at least 62% on FOTO to demonstrate improved functional mobility   ? Baseline 53% (47% disability)   ?  Time 12   ? Period Weeks   ? Status Achieved   65%  ? Target Date 11/20/21   ? ?  ?  ? ?  ? ? ? ? ? ? ? ? Plan - 10/27/21 1546   ? ? Clinical Impression Statement Mr. Wigington reported having intermittant episodes of excrutiating pain  in R SIJ region, which is interfering significantly with activities and QOL.  Focused today on hip strengthening exercises/isometrics to help decrease pull in this region, followed by manual therapy focused on R glut med which was very tight.  He reported decreased pain with transition from sitting to standing afterwards.  Updated HEP, reminded to allow pain to be guide with exercises, hold standing leg extensions now since increasing pain and focus on prone leg extensions instead.  He would benefit from continued skilled therapy.   ? Personal Factors and Comorbidities Comorbidity 3+   ? Comorbidities knee OA, history L ankle injuries, spinal stenosis, HTN   ? PT Frequency 2x / week   ? PT Duration 12 weeks   ? PT Treatment/Interventions ADLs/Self Care Home Management;Moist Heat;Cryotherapy;Gait training;Stair training;Functional mobility training;Therapeutic activities;Therapeutic exercise;Balance training;Neuromuscular re-education;Patient/family education;Manual techniques   ? PT Next Visit Plan continue to progress LLE/LUE strength, balance and gait   ? Consulted and Agree with Plan of Care Patient   ? ?  ?  ? ?  ? ? ?Patient will benefit from skilled therapeutic intervention in order to improve the following deficits and impairments:  Abnormal gait, Decreased endurance, Decreased strength, Decreased balance, Decreased mobility, Decreased safety awareness, Difficulty walking, Improper body mechanics, Impaired vision/preception ? ?Visit Diagnosis: ?Unsteadiness on feet ? ?Difficulty in walking, not elsewhere classified ? ?Muscle weakness (generalized) ? ? ? ? ?Problem List ?Patient Active Problem List  ? Diagnosis Date Noted  ? Cerebral thrombosis with cerebral infarction 06/17/2021  ? Essential hypertension 06/16/2021  ? Depression 06/16/2021  ? Osteoarthritis 06/16/2021  ? Insomnia 06/16/2021  ? Acute left-sided weakness 06/15/2021  ? ? ?Rennie Natter, PT, DPT  ?10/27/2021, 4:01 PM ? ?[facility] ?[facility] ?[place] ?[place], [place], [address] ?Phone: [phone]   Fax:  [phone] ? ?Name: TASHON CAPP ?MRN: [id number] ?Date of Birth: [birth year]

## 2021-10-27 NOTE — Patient Instructions (Signed)
Access Code: [email or web address] ?URL: [email or web address] ?Date: 10/27/2021 ?Prepared by: Harrie Foreman ? ?Exercises ?- Supine Hip Adduction Isometric with Ball  - 1 x daily - 7 x weekly - 2 sets - 10 reps - 6 sec hold ?- Hooklying Clamshell with Resistance  - 1 x daily - 7 x weekly - 2 sets - 10 reps ?- Clam with Resistance  - 1 x daily - 7 x weekly - 3 sets - 10 reps ?- Hooklying SI Joint Self-Correction  - 1 x daily - 7 x weekly - 2 sets - 10 reps - 6 sec  hold ?- Beginner Prone Single Leg Raise  - 1 x daily - 7 x weekly - 2 sets - 10 reps ?

## 2021-10-29 ENCOUNTER — Ambulatory Visit: Payer: PPO

## 2021-10-29 DIAGNOSIS — R2681 Unsteadiness on feet: Secondary | ICD-10-CM | POA: Diagnosis not present

## 2021-10-29 DIAGNOSIS — M6281 Muscle weakness (generalized): Secondary | ICD-10-CM

## 2021-10-29 DIAGNOSIS — R262 Difficulty in walking, not elsewhere classified: Secondary | ICD-10-CM

## 2021-10-29 NOTE — Therapy (Signed)
[facility] ?[facility] ?[address] ?[place], [place], [address] ?Phone: [phone]   Fax:  [phone] ? ?Physical Therapy Treatment ? ?Patient Details  ?Name: Charles Hale ?MRN: [id number] ?Date of Birth: 14-Nov-1940 ?Referring Provider (PT): Bulla, Eather Colas ? ? ?Encounter Date: 10/29/2021 ? ? PT End of Session - 10/29/21 1614   ? ? Visit Number 17   ? Number of Visits 24   ? Date for PT Re-Evaluation 11/20/21   ? Authorization Type HT advantage   ? Progress Note Due on Visit 20   ? PT Start Time 9937   ? PT Stop Time 1696   ? PT Time Calculation (min) 43 min   ? Equipment Utilized During Treatment --   ? Activity Tolerance Patient tolerated treatment well   ? Behavior During Therapy [facility] for tasks assessed/performed   ? ?  ?  ? ?  ? ? ?Past Medical History:  ?Diagnosis Date  ? Anxiety   ? Arthritis   ? Hypertension   ? Spinal stenosis   ? ? ?Past Surgical History:  ?Procedure Laterality Date  ? SHOULDER ARTHROSCOPY    ? ? ?There were no vitals filed for this visit. ? ? Subjective Assessment - 10/29/21 1535   ? ? Subjective Pt reports that he has been walking more around the house and it has been helping his SI joint pain, as well as wearing the knee brace.   ? Pertinent History history chronic LBP, stenosis, L ankle fracture, L ankle severe sprain, OA R knee   ? Diagnostic tests 08/16/21- IMPRESSION:  1. Small focus of acute ischemia within the ventral right medulla  oblongata. No hemorrhage or mass effect.  2. Advanced atrophy and chronic ischemic microangiopathy.   ? Patient Stated Goals "walk"   ? Currently in Pain? No/denies   ? ?  ?  ? ?  ? ? ? ? ? ? ? ? ? ? ? ? ? ? ? ? ? ? ? ? [facility] Adult PT Treatment/Exercise - 10/29/21 0001   ? ?  ? Knee/Hip Exercises: Stretches  ? Piriformis Stretch Right;Left;30 seconds;2 reps   ? Piriformis Stretch Limitations supine KTOS   ? Other Knee/Hip Stretches figure 4 stretch R/L in supine 2 x 30   ? Other Knee/Hip  Stretches extensions over counter 10x   ?  ? Knee/Hip Exercises: Aerobic  ? Nustep L7x40min   ?  ? Knee/Hip Exercises: Supine  ? Hip Adduction Isometric Strengthening;Both;10 reps   ? Hip Adduction Isometric Limitations 5 sec hold with ball   ? Bridges Strengthening;Both;10 reps   ? Bridges Limitations no pain today   ? Other Supine Knee/Hip Exercises clams GTB x20 with 3 sec hold   ? Other Supine Knee/Hip Exercises SI joint correction bil 10x   ? ?  ?  ? ?  ? ? ? ? ? ? ? ? ? ? ? ? PT Short Term Goals - 09/29/21 1455   ? ?  ? PT SHORT TERM GOAL #1  ? Title Ind. with initial HEP   ? Baseline has HEP from HHPT   ? Time 2   ? Period Weeks   ? Status Achieved   ? Target Date 09/11/21   ?  ? PT SHORT TERM GOAL #2  ? Title Pt. will demonstrate improved midline orientation with no weight shift to R with standing, transfers, or walking.   ? Baseline weight shifts and leans to  R   ? Time 6   ? Period Weeks   ? Status Achieved   ? Target Date 10/09/21   ?  ? PT SHORT TERM GOAL #3  ? Title Pt. will demonstrate improved L ankle eversion strength to 3/5 for safety in gait   ? Baseline 1/5 L ankle, rolls, using brace   ? Time 6   ? Period Weeks   ? Status Achieved   09/29/21- 4+/5 L ankle everson.  ? Target Date 10/09/21   ? ?  ?  ? ?  ? ? ? ? PT Long Term Goals - 10/21/21 1529   ? ?  ? PT LONG TERM GOAL #1  ? Title Pt. will be independent with progressed HEP to improve outcomes.   ? Time 12   ? Period Weeks   ? Status On-going   09/29/21- met for current.  ? Target Date 11/20/21   ?  ? PT LONG TERM GOAL #2  ? Title Pt. will be able to ambulate 1000' safely without AD for safety with community ambulation.   ? Time 12   ? Period Weeks   ? Status On-going   09/29/21- 500' without AD and CGA for safety, cues for R heel strike  ? Target Date 11/20/21   ?  ? PT LONG TERM GOAL #3  ? Title Pt. will be able to ascend/descend 12 stairs safely with 1 HR to access home.   ? Time 12   ? Period Weeks   ? Status On-going   09/22/21- can go up at  home, but still difficulty descending  ? Target Date 11/20/21   ?  ? PT LONG TERM GOAL #4  ? Title Pt. will be able to maintain tandem stance x 30 sec bil to demonstrate improved balance.   ? Baseline unable to maintain tandem stance   ? Time 12   ? Period Weeks   ? Status On-going   09/29/21- semi tandem 35 sec R, 19 sec L. Unable to maintain tandem stance.  10/21/21- 15 sec R foot forward, 5 sec L foot foward tandem  ? Target Date 11/20/21   ?  ? PT LONG TERM GOAL #5  ? Title Pt. will score at least 62% on FOTO to demonstrate improved functional mobility   ? Baseline 53% (47% disability)   ? Time 12   ? Period Weeks   ? Status Achieved   65%  ? Target Date 11/20/21   ? ?  ?  ? ?  ? ? ? ? ? ? ? ? Plan - 10/29/21 1614   ? ? Clinical Impression Statement Pt reported less SIJ pain starting today's session. Continued with mat exercises for glute strengthening and stretching to relieve SIJ symptoms. No pain with the interventions today but difficulty noted with supine clams. He was able to complete bridges today with no pain. Cuing given throughout session with exercises. Plan to keep progressing balance and strengthening in WB.   ? Personal Factors and Comorbidities Comorbidity 3+   ? Comorbidities knee OA, history L ankle injuries, spinal stenosis, HTN   ? PT Frequency 2x / week   ? PT Duration 12 weeks   ? PT Treatment/Interventions ADLs/Self Care Home Management;Moist Heat;Cryotherapy;Gait training;Stair training;Functional mobility training;Therapeutic activities;Therapeutic exercise;Balance training;Neuromuscular re-education;Patient/family education;Manual techniques   ? PT Next Visit Plan continue to progress LLE/LUE strength, balance and gait   ? Consulted and Agree with Plan of Care Patient   ? ?  ?  ? ?  ? ? ?  Patient will benefit from skilled therapeutic intervention in order to improve the following deficits and impairments:  Abnormal gait, Decreased endurance, Decreased strength, Decreased balance, Decreased  mobility, Decreased safety awareness, Difficulty walking, Improper body mechanics, Impaired vision/preception ? ?Visit Diagnosis: ?Unsteadiness on feet ? ?Difficulty in walking, not elsewhere classified ? ?Muscle weakness (generalized) ? ? ? ? ?Problem List ?Patient Active Problem List  ? Diagnosis Date Noted  ? Cerebral thrombosis with cerebral infarction 06/17/2021  ? Essential hypertension 06/16/2021  ? Depression 06/16/2021  ? Osteoarthritis 06/16/2021  ? Insomnia 06/16/2021  ? Acute left-sided weakness 06/15/2021  ? ? ?Artist Pais, PTA ?10/29/2021, 5:54 PM ? ?[place] ?[facility] ?[place] ?[place], [place], [address] ?Phone: [phone]   Fax:  [phone] ? ?Name: SPARSH CALLENS ?MRN: [id number] ?Date of Birth: [birth year] ? ? ? ?

## 2021-11-03 ENCOUNTER — Encounter: Payer: Self-pay | Admitting: Physical Therapy

## 2021-11-03 ENCOUNTER — Ambulatory Visit: Payer: PPO | Admitting: Physical Therapy

## 2021-11-03 DIAGNOSIS — R2681 Unsteadiness on feet: Secondary | ICD-10-CM

## 2021-11-03 DIAGNOSIS — M6281 Muscle weakness (generalized): Secondary | ICD-10-CM

## 2021-11-03 DIAGNOSIS — R262 Difficulty in walking, not elsewhere classified: Secondary | ICD-10-CM

## 2021-11-03 NOTE — Therapy (Signed)
[place] ?[facility] ?[place] ?[place], [place], [address] ?Phone: [phone]   Fax:  [phone] ? ?Physical Therapy Treatment ? ?Patient Details  ?Name: Charles Hale ?MRN: [id number] ?Date of Birth: May 14, 1941 ?Referring Provider (PT): Bulla, Eather Colas ? ? ?Encounter Date: 11/03/2021 ? ? PT End of Session - 11/03/21 1457   ? ? Visit Number 18   ? Number of Visits 24   ? Date for PT Re-Evaluation 11/20/21   ? Authorization Type HT advantage   ? Progress Note Due on Visit 20   ? PT Start Time 0923   ? PT Stop Time 1529   ? PT Time Calculation (min) 42 min   ? Activity Tolerance Patient tolerated treatment well   ? Behavior During Therapy [facility] for tasks assessed/performed   ? ?  ?  ? ?  ? ? ?Past Medical History:  ?Diagnosis Date  ? Anxiety   ? Arthritis   ? Hypertension   ? Spinal stenosis   ? ? ?Past Surgical History:  ?Procedure Laterality Date  ? SHOULDER ARTHROSCOPY    ? ? ?There were no vitals filed for this visit. ? ? Subjective Assessment - 11/03/21 1456   ? ? Subjective Pt bought SI belt and has helped his pain.  He reports walking around with 5lb ankle weights so legs a little tired.   ? Pertinent History history chronic LBP, stenosis, L ankle fracture, L ankle severe sprain, OA R knee   ? Diagnostic tests 08/16/21- IMPRESSION:  1. Small focus of acute ischemia within the ventral right medulla  oblongata. No hemorrhage or mass effect.  2. Advanced atrophy and chronic ischemic microangiopathy.   ? Patient Stated Goals "walk"   ? Currently in Pain? Yes   ? Pain Score 2    ? Pain Location Back   ? Pain Orientation Lower   ? ?  ?  ? ?  ? ? ? ? ? ? ? ? ? ? ? ? ? ? ? ? ? ? ? ? [place] Adult PT Treatment/Exercise - 11/03/21 0001   ? ?  ? Knee/Hip Exercises: Aerobic  ? Tread Mill 0.66mh x 5 min   cues for L heel strike and improved  ? ?  ?  ? ?  ? ? ? ? ? ? Balance Exercises - 11/03/21 0001   ? ?  ? Balance Exercises: Standing  ? SLS Eyes open;5 reps   ?  SLS Limitations no UE support, SBA for safety x 5 sec   ? Wall Bumps Hip;Eyes opened;20 reps   ? Marching Limitations;20 reps   ? Marching Limitations marching without UE support, LOB x 2   ? Heel Raises Both;10 reps   alternating with toe raise, no UE support, SBA for safety, 3 large LOB needed modA to correct  ? Toe Raise Both;10 reps   ? Lift / Chop Right;Left;10 reps;Limitations   ? Lift / Chop Limitations with yellow weighted ball x 10 bil   ? Other Standing Exercises reaching for cones outside BOS in front of table, handing cones back, diagonal patterns, LOB x 1 caught self with hand on table.  Perturbations all directions x 2 min LOB x 2 posteriorly   ? Other Standing Exercises Comments swings with weighted ball from knees to overhead to challenge balance, tolerated well.   ? ?  ?  ? ?  ? ? ? ? ? ? ? PT Short Term Goals -  09/29/21 1455   ? ?  ? PT SHORT TERM GOAL #1  ? Title Ind. with initial HEP   ? Baseline has HEP from HHPT   ? Time 2   ? Period Weeks   ? Status Achieved   ? Target Date 09/11/21   ?  ? PT SHORT TERM GOAL #2  ? Title Pt. will demonstrate improved midline orientation with no weight shift to R with standing, transfers, or walking.   ? Baseline weight shifts and leans to R   ? Time 6   ? Period Weeks   ? Status Achieved   ? Target Date 10/09/21   ?  ? PT SHORT TERM GOAL #3  ? Title Pt. will demonstrate improved L ankle eversion strength to 3/5 for safety in gait   ? Baseline 1/5 L ankle, rolls, using brace   ? Time 6   ? Period Weeks   ? Status Achieved   09/29/21- 4+/5 L ankle everson.  ? Target Date 10/09/21   ? ?  ?  ? ?  ? ? ? ? PT Long Term Goals - 10/21/21 1529   ? ?  ? PT LONG TERM GOAL #1  ? Title Pt. will be independent with progressed HEP to improve outcomes.   ? Time 12   ? Period Weeks   ? Status On-going   09/29/21- met for current.  ? Target Date 11/20/21   ?  ? PT LONG TERM GOAL #2  ? Title Pt. will be able to ambulate 1000' safely without AD for safety with community ambulation.    ? Time 12   ? Period Weeks   ? Status On-going   09/29/21- 500' without AD and CGA for safety, cues for R heel strike  ? Target Date 11/20/21   ?  ? PT LONG TERM GOAL #3  ? Title Pt. will be able to ascend/descend 12 stairs safely with 1 HR to access home.   ? Time 12   ? Period Weeks   ? Status On-going   09/22/21- can go up at home, but still difficulty descending  ? Target Date 11/20/21   ?  ? PT LONG TERM GOAL #4  ? Title Pt. will be able to maintain tandem stance x 30 sec bil to demonstrate improved balance.   ? Baseline unable to maintain tandem stance   ? Time 12   ? Period Weeks   ? Status On-going   09/29/21- semi tandem 35 sec R, 19 sec L. Unable to maintain tandem stance.  10/21/21- 15 sec R foot forward, 5 sec L foot foward tandem  ? Target Date 11/20/21   ?  ? PT LONG TERM GOAL #5  ? Title Pt. will score at least 62% on FOTO to demonstrate improved functional mobility   ? Baseline 53% (47% disability)   ? Time 12   ? Period Weeks   ? Status Achieved   65%  ? Target Date 11/20/21   ? ?  ?  ? ?  ? ? ? ? ? ? ? ? Plan - 11/03/21 1620   ? ? Clinical Impression Statement Pt. reported improved SIJ pain with SI belt.  Today focused on continuing gait and balance training, including reaching outside BOS, posterior weight sihfts, and balance reactions.  Challenged with heel toe raises without UE support, several large posterior LOB, but with foward reaching and reaching diagonals had no difficulty.  Also had large LOB with perturbations espeically posterior  pushes or compensating when pushed foward.  Overall continues to make good progress, would benefit from continued skilled therapy to improve balance and strengh.   ? Personal Factors and Comorbidities Comorbidity 3+   ? Comorbidities knee OA, history L ankle injuries, spinal stenosis, HTN   ? PT Frequency 2x / week   ? PT Duration 12 weeks   ? PT Treatment/Interventions ADLs/Self Care Home Management;Moist Heat;Cryotherapy;Gait training;Stair training;Functional  mobility training;Therapeutic activities;Therapeutic exercise;Balance training;Neuromuscular re-education;Patient/family education;Manual techniques   ? PT Next Visit Plan continue to progress LLE/LUE strength, balance and gait   ? Consulted and Agree with Plan of Care Patient   ? ?  ?  ? ?  ? ? ?Patient will benefit from skilled therapeutic intervention in order to improve the following deficits and impairments:  Abnormal gait, Decreased endurance, Decreased strength, Decreased balance, Decreased mobility, Decreased safety awareness, Difficulty walking, Improper body mechanics, Impaired vision/preception ? ?Visit Diagnosis: ?Unsteadiness on feet ? ?Difficulty in walking, not elsewhere classified ? ?Muscle weakness (generalized) ? ? ? ? ?Problem List ?Patient Active Problem List  ? Diagnosis Date Noted  ? Cerebral thrombosis with cerebral infarction 06/17/2021  ? Essential hypertension 06/16/2021  ? Depression 06/16/2021  ? Osteoarthritis 06/16/2021  ? Insomnia 06/16/2021  ? Acute left-sided weakness 06/15/2021  ? ? ?Rennie Natter, PT, DPT  ?11/03/2021, 4:27 PM ? ? ?[facility] ?[place] ?[place], [place], [address] ?Phone: [phone]   Fax:  [phone] ? ?Name: Charles Hale ?MRN: [id number] ?Date of Birth: [birth year] ? ? ? ?

## 2021-11-05 ENCOUNTER — Ambulatory Visit: Payer: PPO

## 2021-11-05 DIAGNOSIS — R2681 Unsteadiness on feet: Secondary | ICD-10-CM

## 2021-11-05 DIAGNOSIS — R262 Difficulty in walking, not elsewhere classified: Secondary | ICD-10-CM

## 2021-11-05 DIAGNOSIS — M6281 Muscle weakness (generalized): Secondary | ICD-10-CM

## 2021-11-05 NOTE — Therapy (Signed)
[place] ?[facility] ?[place] ?[place], [place], [address] ?Phone: [phone]   Fax:  [phone] ? ?Physical Therapy Treatment ? ?Patient Details  ?Name: Charles Hale ?MRN: [id number] ?Date of Birth: 1940-12-01 ?Referring Provider (PT): Bulla, Eather Colas ? ? ?Encounter Date: 11/05/2021 ? ? PT End of Session - 11/05/21 1617   ? ? Visit Number 19   ? Number of Visits 24   ? Date for PT Re-Evaluation 11/20/21   ? Authorization Type HT advantage   ? Progress Note Due on Visit 20   ? PT Start Time 6720   ? PT Stop Time 9470   ? PT Time Calculation (min) 43 min   ? Activity Tolerance Patient tolerated treatment well   ? Behavior During Therapy [facility] for tasks assessed/performed   ? ?  ?  ? ?  ? ? ?Past Medical History:  ?Diagnosis Date  ? Anxiety   ? Arthritis   ? Hypertension   ? Spinal stenosis   ? ? ?Past Surgical History:  ?Procedure Laterality Date  ? SHOULDER ARTHROSCOPY    ? ? ?There were no vitals filed for this visit. ? ? Subjective Assessment - 11/05/21 1533   ? ? Subjective Pt reports everything is good.   ? Pertinent History history chronic LBP, stenosis, L ankle fracture, L ankle severe sprain, OA R knee   ? Diagnostic tests 08/16/21- IMPRESSION:  1. Small focus of acute ischemia within the ventral right medulla  oblongata. No hemorrhage or mass effect.  2. Advanced atrophy and chronic ischemic microangiopathy.   ? Patient Stated Goals "walk"   ? Currently in Pain? Yes   ? Pain Score 1    ? Pain Location Back   ? Pain Orientation Right;Lower   ? Pain Descriptors / Indicators Aching   ? Pain Type Chronic pain   ? ?  ?  ? ?  ? ? ? ? ? ? ? ? ? ? ? ? ? ? ? ? ? ? ? ? [place] Adult PT Treatment/Exercise - 11/05/21 0001   ? ?  ? Lumbar Exercises: Standing  ? Other Standing Lumbar Exercises palloff press with GTB 2x10 R/L   ? Other Standing Lumbar Exercises lift and chop with yellow weighted ball x 10 R/L; UE punches with 3lb weights x 10; 2nd trial with  feet together   ?  ? Knee/Hip Exercises: Aerobic  ? Nustep L7x29min   ?  ? Knee/Hip Exercises: Seated  ? Other Seated Knee/Hip Exercises resisted EV and DF x20 with YTB   ? Hamstring Curl Strengthening;Both;10 reps   ? Hamstring Limitations GTB   ? ?  ?  ? ?  ? ? ? ? ? ? Balance Exercises - 11/05/21 0001   ? ?  ? Balance Exercises: Standing  ? Marching 20 reps   ? Marching Limitations marching without UE support   ? Heel Raises Both;20 reps   ? Toe Raise Both;15 reps   ? ?  ?  ? ?  ? ? ? ? ? ? ? PT Short Term Goals - 09/29/21 1455   ? ?  ? PT SHORT TERM GOAL #1  ? Title Ind. with initial HEP   ? Baseline has HEP from HHPT   ? Time 2   ? Period Weeks   ? Status Achieved   ? Target Date 09/11/21   ?  ? PT SHORT TERM GOAL #2  ? Title Pt.  will demonstrate improved midline orientation with no weight shift to R with standing, transfers, or walking.   ? Baseline weight shifts and leans to R   ? Time 6   ? Period Weeks   ? Status Achieved   ? Target Date 10/09/21   ?  ? PT SHORT TERM GOAL #3  ? Title Pt. will demonstrate improved L ankle eversion strength to 3/5 for safety in gait   ? Baseline 1/5 L ankle, rolls, using brace   ? Time 6   ? Period Weeks   ? Status Achieved   09/29/21- 4+/5 L ankle everson.  ? Target Date 10/09/21   ? ?  ?  ? ?  ? ? ? ? PT Long Term Goals - 10/21/21 1529   ? ?  ? PT LONG TERM GOAL #1  ? Title Pt. will be independent with progressed HEP to improve outcomes.   ? Time 12   ? Period Weeks   ? Status On-going   09/29/21- met for current.  ? Target Date 11/20/21   ?  ? PT LONG TERM GOAL #2  ? Title Pt. will be able to ambulate 1000' safely without AD for safety with community ambulation.   ? Time 12   ? Period Weeks   ? Status On-going   09/29/21- 500' without AD and CGA for safety, cues for R heel strike  ? Target Date 11/20/21   ?  ? PT LONG TERM GOAL #3  ? Title Pt. will be able to ascend/descend 12 stairs safely with 1 HR to access home.   ? Time 12   ? Period Weeks   ? Status On-going   09/22/21-  can go up at home, but still difficulty descending  ? Target Date 11/20/21   ?  ? PT LONG TERM GOAL #4  ? Title Pt. will be able to maintain tandem stance x 30 sec bil to demonstrate improved balance.   ? Baseline unable to maintain tandem stance   ? Time 12   ? Period Weeks   ? Status On-going   09/29/21- semi tandem 35 sec R, 19 sec L. Unable to maintain tandem stance.  10/21/21- 15 sec R foot forward, 5 sec L foot foward tandem  ? Target Date 11/20/21   ?  ? PT LONG TERM GOAL #5  ? Title Pt. will score at least 62% on FOTO to demonstrate improved functional mobility   ? Baseline 53% (47% disability)   ? Time 12   ? Period Weeks   ? Status Achieved   65%  ? Target Date 11/20/21   ? ?  ?  ? ?  ? ? ? ? ? ? ? ? Plan - 11/05/21 1618   ? ? Clinical Impression Statement Pt has reported good improvement of SIJ pain with use of SIJ belt. Continued progressing unsupported exercises in standing to challenge balance. Pt only showed some LOB with the marches and his form began to deteriorate so we switched to some different exercises. Cues required to keep stable core with pallof presses and for slow/controlled movements. He tolerated the progressed interventions today well.   ? Personal Factors and Comorbidities Comorbidity 3+   ? Comorbidities knee OA, history L ankle injuries, spinal stenosis, HTN   ? PT Frequency 2x / week   ? PT Duration 12 weeks   ? PT Treatment/Interventions ADLs/Self Care Home Management;Moist Heat;Cryotherapy;Gait training;Stair training;Functional mobility training;Therapeutic activities;Therapeutic exercise;Balance training;Neuromuscular re-education;Patient/family education;Manual techniques   ?  PT Next Visit Plan continue to progress LLE/LUE strength, balance and gait   ? Consulted and Agree with Plan of Care Patient   ? ?  ?  ? ?  ? ? ?Patient will benefit from skilled therapeutic intervention in order to improve the following deficits and impairments:  Abnormal gait, Decreased endurance, Decreased  strength, Decreased balance, Decreased mobility, Decreased safety awareness, Difficulty walking, Improper body mechanics, Impaired vision/preception ? ?Visit Diagnosis: ?Unsteadiness on feet ? ?Difficulty in walking, not elsewhere classified ? ?Muscle weakness (generalized) ? ? ? ? ?Problem List ?Patient Active Problem List  ? Diagnosis Date Noted  ? Cerebral thrombosis with cerebral infarction 06/17/2021  ? Essential hypertension 06/16/2021  ? Depression 06/16/2021  ? Osteoarthritis 06/16/2021  ? Insomnia 06/16/2021  ? Acute left-sided weakness 06/15/2021  ? ? ?Artist Pais, PTA ?11/05/2021, 4:36 PM ? ?[place] ?[facility] ?[place] ?[place], [place], [address] ?Phone: [phone]   Fax:  [phone] ? ?Name: Charles Hale ?MRN: [id number] ?Date of Birth: [birth year] ? ? ? ?

## 2021-11-10 ENCOUNTER — Ambulatory Visit: Payer: PPO

## 2021-11-10 DIAGNOSIS — R2681 Unsteadiness on feet: Secondary | ICD-10-CM | POA: Diagnosis not present

## 2021-11-10 DIAGNOSIS — M6281 Muscle weakness (generalized): Secondary | ICD-10-CM

## 2021-11-10 DIAGNOSIS — R262 Difficulty in walking, not elsewhere classified: Secondary | ICD-10-CM

## 2021-11-10 NOTE — Therapy (Signed)
[place] ?[facility] ?[place] ?[place], [place], [address] ?Phone: [phone]   Fax:  [phone] ? ?Physical Therapy Treatment ? ?Patient Details  ?Name: Charles Hale ?MRN: [id number] ?Date of Birth: [birth year] ?Referring Provider (PT): Bulla, Eather Colas ? ? ?Encounter Date: 11/10/2021 ? ? PT End of Session - 11/10/21 1814   ? ? Visit Number 20   ? Number of Visits 24   ? Date for PT Re-Evaluation 11/20/21   ? Authorization Type HT advantage   ? Progress Note Due on Visit 20   ? PT Start Time 4801   ? PT Stop Time 1528   ? PT Time Calculation (min) 41 min   ? Activity Tolerance Patient tolerated treatment well   ? Behavior During Therapy [facility] for tasks assessed/performed   ? ?  ?  ? ?  ? ? ?Past Medical History:  ?Diagnosis Date  ? Anxiety   ? Arthritis   ? Hypertension   ? Spinal stenosis   ? ? ?Past Surgical History:  ?Procedure Laterality Date  ? SHOULDER ARTHROSCOPY    ? ? ?There were no vitals filed for this visit. ? ? Subjective Assessment - 11/10/21 1450   ? ? Subjective Pt wants to work more on leg strength to imrpove his ability to stand w/o UE support.   ? Pertinent History history chronic LBP, stenosis, L ankle fracture, L ankle severe sprain, OA R knee   ? Diagnostic tests 08/16/21- IMPRESSION:  1. Small focus of acute ischemia within the ventral right medulla  oblongata. No hemorrhage or mass effect.  2. Advanced atrophy and chronic ischemic microangiopathy.   ? Patient Stated Goals "walk"   ? Currently in Pain? No/denies   ? ?  ?  ? ?  ? ? ? ? ? ? ? ? ? ? ? ? ? ? ? ? ? ? ? ? [place] Adult PT Treatment/Exercise - 11/10/21 0001   ? ?  ? Knee/Hip Exercises: Aerobic  ? Nustep L7x64min   ?  ? Knee/Hip Exercises: Machines for Strengthening  ? Cybex Knee Extension 25# B LE x 10; 10# L LE 2x10   ? Cybex Knee Flexion 35# x 20 BLE; 15# x 20 LLE   ?  ? Knee/Hip Exercises: Standing  ? Hip Abduction Stengthening;Both;Knee straight   ? Abduction Limitations  2#   ?  ? Knee/Hip Exercises: Seated  ? Long [employer] Strengthening;Both;Weights   ? Long Arc Quad Weight 2 lbs.   pt noting legs feeling heavy today  ? Sit to Sand 2 sets;10 reps;with UE support;without UE support   no UE on con/ UE support with ecc  ? ?  ?  ? ?  ? ? ? ? ? ? Balance Exercises - 11/10/21 0001   ? ?  ? Balance Exercises: Standing  ? Sidestepping 4 reps;Theraband   along long side of mat table with GTB; LOB x 1 going to L side  ? Theraband Level (Sidestepping) Level 3 (Green)   ? Lift / Chop Right;Left;10 reps;Limitations   ? Lift / Chop Limitations with yellow weighted ball x 10 bil   ? Other Standing Exercises trunk rotations with yellow weighted ball x 10 bil   ? ?  ?  ? ?  ? ? ? ? ? ? ? PT Short Term Goals - 09/29/21 1455   ? ?  ? PT SHORT TERM GOAL #1  ? Title Ind. with initial  HEP   ? Baseline has HEP from HHPT   ? Time 2   ? Period Weeks   ? Status Achieved   ? Target Date 09/11/21   ?  ? PT SHORT TERM GOAL #2  ? Title Pt. will demonstrate improved midline orientation with no weight shift to R with standing, transfers, or walking.   ? Baseline weight shifts and leans to R   ? Time 6   ? Period Weeks   ? Status Achieved   ? Target Date 10/09/21   ?  ? PT SHORT TERM GOAL #3  ? Title Pt. will demonstrate improved L ankle eversion strength to 3/5 for safety in gait   ? Baseline 1/5 L ankle, rolls, using brace   ? Time 6   ? Period Weeks   ? Status Achieved   09/29/21- 4+/5 L ankle everson.  ? Target Date 10/09/21   ? ?  ?  ? ?  ? ? ? ? PT Long Term Goals - 11/10/21 1816   ? ?  ? PT LONG TERM GOAL #1  ? Title Pt. will be independent with progressed HEP to improve outcomes.   ? Time 12   ? Period Weeks   ? Status Partially Met   09/29/21- met for current.  ? Target Date 11/20/21   ?  ? PT LONG TERM GOAL #2  ? Title Pt. will be able to ambulate 1000' safely without AD for safety with community ambulation.   ? Time 12   ? Period Weeks   ? Status On-going   09/29/21- 500' without AD and CGA for safety,  cues for R heel strike  ? Target Date 11/20/21   ?  ? PT LONG TERM GOAL #3  ? Title Pt. will be able to ascend/descend 12 stairs safely with 1 HR to access home.   ? Time 12   ? Period Weeks   ? Status On-going   09/22/21- can go up at home, but still difficulty descending  ? Target Date 11/20/21   ?  ? PT LONG TERM GOAL #4  ? Title Pt. will be able to maintain tandem stance x 30 sec bil to demonstrate improved balance.   ? Baseline unable to maintain tandem stance   ? Time 12   ? Period Weeks   ? Status On-going   09/29/21- semi tandem 35 sec R, 19 sec L. Unable to maintain tandem stance.  10/21/21- 15 sec R foot forward, 5 sec L foot foward tandem  ? Target Date 11/20/21   ?  ? PT LONG TERM GOAL #5  ? Title Pt. will score at least 62% on FOTO to demonstrate improved functional mobility   ? Baseline 53% (47% disability)   ? Time 12   ? Period Weeks   ? Status Achieved   65%  ? Target Date 11/20/21   ? ?  ?  ? ?  ? ? ? ? ? ? ? ? Plan - 11/10/21 1817   ? ? Clinical Impression Statement Pt w/o any complaints with interventions today. Showed some LOB with the sidesteps leading with L LE. He notes that navigating stairs at home has gotten much easier and he feels safer doing them at home. We walked for a bit w/o his AD today, he was limited noting that his L LE felt heavy today so he was unsteady walking w/o his cane. He is to see his MD on Sunday so next visit will need  a PN for updates.   ? Personal Factors and Comorbidities Comorbidity 3+   ? Comorbidities knee OA, history L ankle injuries, spinal stenosis, HTN   ? PT Frequency 2x / week   ? PT Duration 12 weeks   ? PT Treatment/Interventions ADLs/Self Care Home Management;Moist Heat;Cryotherapy;Gait training;Stair training;Functional mobility training;Therapeutic activities;Therapeutic exercise;Balance training;Neuromuscular re-education;Patient/family education;Manual techniques   ? PT Next Visit Plan continue to progress LLE/LUE strength, balance and gait   ? Consulted  and Agree with Plan of Care Patient   ? ?  ?  ? ?  ? ? ?Patient will benefit from skilled therapeutic intervention in order to improve the following deficits and impairments:  Abnormal gait, Decreased endurance, Decreased strength, Decreased balance, Decreased mobility, Decreased safety awareness, Difficulty walking, Improper body mechanics, Impaired vision/preception ? ?Visit Diagnosis: ?Unsteadiness on feet ? ?Difficulty in walking, not elsewhere classified ? ?Muscle weakness (generalized) ? ? ? ? ?Problem List ?Patient Active Problem List  ? Diagnosis Date Noted  ? Cerebral thrombosis with cerebral infarction 06/17/2021  ? Essential hypertension 06/16/2021  ? Depression 06/16/2021  ? Osteoarthritis 06/16/2021  ? Insomnia 06/16/2021  ? Acute left-sided weakness 06/15/2021  ? ? ?Artist Pais, PTA ?11/10/2021, 6:25 PM ? ?[place] ?[facility] ?[place] ?[place], [place], [address] ?Phone: [phone]   Fax:  [phone] ? ?Name: CHRISTOHER DRUDGE ?MRN: [id number] ?Date of Birth: 11-Sep-1940 ? ? ? ?

## 2021-11-11 ENCOUNTER — Other Ambulatory Visit (HOSPITAL_COMMUNITY): Payer: Self-pay

## 2021-11-12 ENCOUNTER — Ambulatory Visit: Payer: PPO | Admitting: Physical Therapy

## 2021-11-12 ENCOUNTER — Encounter: Payer: Self-pay | Admitting: Physical Therapy

## 2021-11-12 DIAGNOSIS — M6281 Muscle weakness (generalized): Secondary | ICD-10-CM

## 2021-11-12 DIAGNOSIS — R262 Difficulty in walking, not elsewhere classified: Secondary | ICD-10-CM

## 2021-11-12 DIAGNOSIS — R2681 Unsteadiness on feet: Secondary | ICD-10-CM | POA: Diagnosis not present

## 2021-11-12 NOTE — Therapy (Signed)
[place] ?[facility] ?[place] ?[place], [place], [address] ?Phone: [phone]   Fax:  [phone] ? ?Physical Therapy Treatment/Progress Note ? ?Progress Note ?Reporting Period 09/29/2021 to 11/12/2021 ? ?See note below for Objective Data and Assessment of Progress/Goals.  ? ? ? ?Patient Details  ?Name: Charles Hale ?MRN: [id number] ?Date of Birth: Aug 24, 1940 ?Referring Provider (PT): Bulla, Eather Colas ? ? ?Encounter Date: 11/12/2021 ? ? PT End of Session - 11/12/21 1458   ? ? Visit Number 21   ? Number of Visits 40   ? Date for PT Re-Evaluation 01/21/22   ? Authorization Type HT advantage   ? Progress Note Due on Visit 20   ? PT Start Time 1450   ? PT Stop Time 1530   ? PT Time Calculation (min) 40 min   ? Activity Tolerance Patient tolerated treatment well   ? Behavior During Therapy [facility] for tasks assessed/performed   ? ?  ?  ? ?  ? ? ?Past Medical History:  ?Diagnosis Date  ? Anxiety   ? Arthritis   ? Hypertension   ? Spinal stenosis   ? ? ?Past Surgical History:  ?Procedure Laterality Date  ? SHOULDER ARTHROSCOPY    ? ? ?There were no vitals filed for this visit. ? ? Subjective Assessment - 11/12/21 1455   ? ? Subjective Pt. reports that he's getting the strength back in his L leg but still feels weak in L arm.   ? Pertinent History history chronic LBP, stenosis, L ankle fracture, L ankle severe sprain, OA R knee   ? Diagnostic tests 08/16/21- IMPRESSION:  1. Small focus of acute ischemia within the ventral right medulla  oblongata. No hemorrhage or mass effect.  2. Advanced atrophy and chronic ischemic microangiopathy.   ? Patient Stated Goals "walk"   ? Currently in Pain? No/denies   ? Pain Score 4    ? Pain Location Buttocks   ? Pain Orientation Right   ? ?  ?  ? ?  ? ? ? ? ? [facility] PT Assessment - 11/12/21 0001   ? ?  ? Assessment  ? Medical Diagnosis I63.329 (ICD-10-CM) - Cerebral infarction due to thrombosis of unspecified anterior cerebral artery    ? Referring Provider (PT) [place] Breeding, Eather Colas   ? Onset Date/Surgical Date 08/14/21   ?  ? Strength  ? Overall Strength Deficits   ? Overall Strength Comments tested in sitting   ? Strength Assessment Site Shoulder;Elbow;Hand   ? Right/Left Shoulder Left   ? Left Shoulder Flexion 5/5   ? Left Shoulder ABduction 5/5   ? Left Shoulder Internal Rotation 4/5   ? Left Shoulder External Rotation 4+/5   ? Right/Left Elbow Left   ? Left Elbow Flexion 5/5   ? Left Elbow Extension 5/5   ? Right/Left hand Left;Right   ? Right Hand Gross Grasp Functional   ? Right Hand Grip (lbs) 55   ? Right Hand Lateral Pinch 18 lbs   ? Left Hand Gross Grasp Impaired   ? Left Hand Grip (lbs) 40   ? Left Hand Lateral Pinch 8 lbs   ? Right Hip Flexion 5/5   ? Right Hip ABduction 5/5   ? Right Hip ADduction 5/5   ? Left Hip Flexion 5/5   ? Left Hip ABduction 5/5   ? Left Hip ADduction 5/5   ? Right Knee Flexion 5/5   ? Right Knee  Extension 5/5   ? Left Knee Flexion 5/5   ? Left Knee Extension 5/5   ? ?  ?  ? ?  ? ? ? ? ? ? ? ? ? ? ? ? ? ? ? ? [facility] PT Treatment/Exercise - 11/12/21 0001   ? ?  ? Ambulation/Gait  ? Ambulation/Gait Yes   ? Ambulation/Gait Assistance 6: Modified independent (Device/Increase time)   ? Ambulation/Gait Assistance Details without cane able to walk 50' before LOB, no LOB with cane, additional 250'   ? Assistive device Straight cane   ? Gait Pattern Step-through pattern   ? Gait velocity 0.6   ? Stairs Yes   ? Stairs Assistance 6: Modified independent (Device/Increase time)   ? Stair Management Technique One rail Right;Step to pattern;With cane;One rail Left   ? Number of Stairs 14   ? Height of Stairs 8   ? Gait Comments fatiued quickly today, needed standing rest break after 1 flight stairs.   ?  ? Exercises  ? Exercises Knee/Hip   ?  ? Knee/Hip Exercises: Aerobic  ? Nustep L7x55min   ? ?  ?  ? ?  ? ? ? ? ? ? ? ? ? ? ? ? PT Short Term Goals - 09/29/21 1455   ? ?  ? PT SHORT TERM GOAL #1  ? Title Ind. with initial  HEP   ? Baseline has HEP from HHPT   ? Time 2   ? Period Weeks   ? Status Achieved   ? Target Date 09/11/21   ?  ? PT SHORT TERM GOAL #2  ? Title Pt. will demonstrate improved midline orientation with no weight shift to R with standing, transfers, or walking.   ? Baseline weight shifts and leans to R   ? Time 6   ? Period Weeks   ? Status Achieved   ? Target Date 10/09/21   ?  ? PT SHORT TERM GOAL #3  ? Title Pt. will demonstrate improved L ankle eversion strength to 3/5 for safety in gait   ? Baseline 1/5 L ankle, rolls, using brace   ? Time 6   ? Period Weeks   ? Status Achieved   09/29/21- 4+/5 L ankle everson.  ? Target Date 10/09/21   ? ?  ?  ? ?  ? ? ? ? PT Long Term Goals - 11/12/21 1458   ? ?  ? PT LONG TERM GOAL #1  ? Title Pt. will be independent with progressed HEP to improve outcomes.   ? Time 12   ? Period Weeks   ? Status Achieved   09/29/21- met for current.  ? Target Date 11/20/21   ?  ? PT LONG TERM GOAL #2  ? Title Pt. will be able to ambulate 1000' safely without AD for safety with community ambulation.   ? Time 12   ? Period Weeks   ? Status On-going   09/29/21- 500' without AD and CGA for safety, cues for R heel strike.  11/12/21- 65' without AD before LOB due to fatigue/distraction  ? Target Date 01/21/22   ?  ? PT LONG TERM GOAL #3  ? Title Pt. will be able to ascend/descend 12 stairs safely with 1 HR to access home.   ? Time 12   ? Period Weeks   ? Status On-going   09/22/21- can go up at home, but still difficulty descending  11/12/21- step to gait and HR on  stairs, fatigued quickly  ? Target Date 01/21/22   ?  ? PT LONG TERM GOAL #4  ? Title Pt. will be able to maintain tandem stance x 30 sec bil to demonstrate improved balance.   ? Baseline unable to maintain tandem stance   ? Time 12   ? Period Weeks   ? Status On-going   09/29/21- semi tandem 35 sec R, 19 sec L. Unable to maintain tandem stance.  10/21/21- 15 sec R foot forward, 5 sec L foot foward tandem  11/12/21- 10 sec bil  ? Target Date  01/21/22   ?  ? PT LONG TERM GOAL #5  ? Title Pt. will score at least 62% on FOTO to demonstrate improved functional mobility   ? Baseline 53% (47% disability)   ? Time 12   ? Period Weeks   ? Status Achieved   65%  ? Target Date 11/20/21   ?  ? Additional Long Term Goals  ? Additional Long Term Goals Yes   ?  ? PT LONG TERM GOAL #6  ? Title Pt. will improve gait speed to >0.8 m/s for community ambulation.   ? Baseline 0.6 m/s   ? Time 10   ? Period Weeks   ? Status New   ? Target Date 01/21/22   ? ?  ?  ? ?  ? ? ? ? ? ? ? ? Plan - 11/12/21 1734   ? ? Clinical Impression Statement Mr. Bunner is making good progress overall, demonstrating improved strength in LLE.  He does demonstrate shoulder and hand weakness on L, but he reports prior L shoulder surgery and L CMC arthritis, and is R hand dominant, so expected that L side would be weaker even prior to CVA.  He does demonstrate good L hand dexterity.  He still fatigues very quickly and has significant balance and gait deficits.  He would benefit from continued skilled therapy for additional 2x/week for 10 weeks to 01/21/22 to continue to address balance and gait deficits in order to improve safety and mobility.   ? Personal Factors and Comorbidities Comorbidity 3+   ? Comorbidities knee OA, history L ankle injuries, spinal stenosis, HTN   ? PT Frequency 2x / week   ? PT Duration 12 weeks   ? PT Treatment/Interventions ADLs/Self Care Home Management;Moist Heat;Cryotherapy;Gait training;Stair training;Functional mobility training;Therapeutic activities;Therapeutic exercise;Balance training;Neuromuscular re-education;Patient/family education;Manual techniques   ? PT Next Visit Plan continue to progress LLE/LUE strength, balance and gait   ? Consulted and Agree with Plan of Care Patient   ? ?  ?  ? ?  ? ? ?Patient will benefit from skilled therapeutic intervention in order to improve the following deficits and impairments:  Abnormal gait, Decreased endurance, Decreased  strength, Decreased balance, Decreased mobility, Decreased safety awareness, Difficulty walking, Improper body mechanics, Impaired vision/preception ? ?Visit Diagnosis: ?Unsteadiness on feet ? ?Difficulty in w

## 2021-11-16 ENCOUNTER — Other Ambulatory Visit (HOSPITAL_COMMUNITY): Payer: Self-pay

## 2021-11-16 MED ORDER — TRAMADOL HCL 50 MG PO TABS
ORAL_TABLET | ORAL | 1 refills | Status: DC
  Filled 2021-11-16: qty 36, 7d supply, fill #0

## 2021-11-16 MED ORDER — TELMISARTAN 40 MG PO TABS
ORAL_TABLET | ORAL | 3 refills | Status: DC
  Filled 2021-11-16: qty 30, 30d supply, fill #0

## 2021-11-17 ENCOUNTER — Ambulatory Visit: Payer: PPO | Attending: Internal Medicine

## 2021-11-17 DIAGNOSIS — R2681 Unsteadiness on feet: Secondary | ICD-10-CM | POA: Diagnosis present

## 2021-11-17 DIAGNOSIS — M6281 Muscle weakness (generalized): Secondary | ICD-10-CM | POA: Diagnosis present

## 2021-11-17 DIAGNOSIS — R262 Difficulty in walking, not elsewhere classified: Secondary | ICD-10-CM | POA: Diagnosis present

## 2021-11-17 NOTE — Therapy (Signed)
[facility] ?[facility] ?[place] ?[place], [place], [address] ?Phone: [phone]   Fax:  [phone] ? ?Physical Therapy Treatment ? ?Patient Details  ?Name: Charles Hale ?MRN: [id number] ?Date of Birth: 04-22-1941 ?Referring Provider (PT): Bulla, Eather Colas ? ? ?Encounter Date: 11/17/2021 ? ? PT End of Session - 11/17/21 1532   ? ? Visit Number 22   ? Number of Visits 40   ? Date for PT Re-Evaluation 01/21/22   ? Authorization Type HT advantage   ? Progress Note Due on Visit 30   ? PT Start Time 6010   ? PT Stop Time 1528   ? PT Time Calculation (min) 41 min   ? Activity Tolerance Patient tolerated treatment well   ? Behavior During Therapy [facility] for tasks assessed/performed   ? ?  ?  ? ?  ? ? ?Past Medical History:  ?Diagnosis Date  ? Anxiety   ? Arthritis   ? Hypertension   ? Spinal stenosis   ? ? ?Past Surgical History:  ?Procedure Laterality Date  ? SHOULDER ARTHROSCOPY    ? ? ?There were no vitals filed for this visit. ? ? Subjective Assessment - 11/17/21 1452   ? ? Subjective Had a steroid shot in the low back Sunday, felt really good and did too much, now hurting.   ? Pertinent History history chronic LBP, stenosis, L ankle fracture, L ankle severe sprain, OA R knee   ? Diagnostic tests 08/16/21- IMPRESSION:  1. Small focus of acute ischemia within the ventral right medulla  oblongata. No hemorrhage or mass effect.  2. Advanced atrophy and chronic ischemic microangiopathy.   ? Patient Stated Goals "walk"   ? Currently in Pain? Yes   ? Pain Score 5    ? Pain Location Back   ? Pain Orientation Right   ? Pain Descriptors / Indicators Aching;Numbness   ? Pain Type Chronic pain   ? ?  ?  ? ?  ? ? ? ? ? ? ? ? ? ? ? ? ? ? ? ? ? ? ? ? [place] Adult PT Treatment/Exercise - 11/17/21 0001   ? ?  ? Ambulation/Gait  ? Ambulation/Gait Yes   ? Gait Comments gait 150 ft no AD, 50 ft to finish gait with SPC; cues for pacing and for symmertircal step length   ?  ?  Exercises  ? Exercises Knee/Hip   ?  ? Knee/Hip Exercises: Stretches  ? Passive Hamstring Stretch Right;2 reps;30 seconds   ? Passive Hamstring Stretch Limitations supine   ?  ? Knee/Hip Exercises: Aerobic  ? Nustep L7x20min   ?  ? Knee/Hip Exercises: Supine  ? Other Supine Knee/Hip Exercises SI joint correction 5x5"   ?  ? Manual Therapy  ? Manual Therapy Soft tissue mobilization;Myofascial release   ? Soft tissue mobilization IASTM with foam roll and STM to R lumbar paraspinals, QL, glutes, hamtrings   ? Myofascial Release TPR to R QL, hamstrings   ? ?  ?  ? ?  ? ? ? ? ? ? ? ? ? ? ? ? PT Short Term Goals - 09/29/21 1455   ? ?  ? PT SHORT TERM GOAL #1  ? Title Ind. with initial HEP   ? Baseline has HEP from HHPT   ? Time 2   ? Period Weeks   ? Status Achieved   ? Target Date 09/11/21   ?  ? PT  SHORT TERM GOAL #2  ? Title Pt. will demonstrate improved midline orientation with no weight shift to R with standing, transfers, or walking.   ? Baseline weight shifts and leans to R   ? Time 6   ? Period Weeks   ? Status Achieved   ? Target Date 10/09/21   ?  ? PT SHORT TERM GOAL #3  ? Title Pt. will demonstrate improved L ankle eversion strength to 3/5 for safety in gait   ? Baseline 1/5 L ankle, rolls, using brace   ? Time 6   ? Period Weeks   ? Status Achieved   09/29/21- 4+/5 L ankle everson.  ? Target Date 10/09/21   ? ?  ?  ? ?  ? ? ? ? PT Long Term Goals - 11/12/21 1458   ? ?  ? PT LONG TERM GOAL #1  ? Title Pt. will be independent with progressed HEP to improve outcomes.   ? Time 12   ? Period Weeks   ? Status Achieved   09/29/21- met for current.  ? Target Date 11/20/21   ?  ? PT LONG TERM GOAL #2  ? Title Pt. will be able to ambulate 1000' safely without AD for safety with community ambulation.   ? Time 12   ? Period Weeks   ? Status On-going   09/29/21- 500' without AD and CGA for safety, cues for R heel strike.  11/12/21- 71' without AD before LOB due to fatigue/distraction  ? Target Date 01/21/22   ?  ? PT LONG TERM  GOAL #3  ? Title Pt. will be able to ascend/descend 12 stairs safely with 1 HR to access home.   ? Time 12   ? Period Weeks   ? Status On-going   09/22/21- can go up at home, but still difficulty descending  11/12/21- step to gait and HR on stairs, fatigued quickly  ? Target Date 01/21/22   ?  ? PT LONG TERM GOAL #4  ? Title Pt. will be able to maintain tandem stance x 30 sec bil to demonstrate improved balance.   ? Baseline unable to maintain tandem stance   ? Time 12   ? Period Weeks   ? Status On-going   09/29/21- semi tandem 35 sec R, 19 sec L. Unable to maintain tandem stance.  10/21/21- 15 sec R foot forward, 5 sec L foot foward tandem  11/12/21- 10 sec bil  ? Target Date 01/21/22   ?  ? PT LONG TERM GOAL #5  ? Title Pt. will score at least 62% on FOTO to demonstrate improved functional mobility   ? Baseline 53% (47% disability)   ? Time 12   ? Period Weeks   ? Status Achieved   65%  ? Target Date 11/20/21   ?  ? Additional Long Term Goals  ? Additional Long Term Goals Yes   ?  ? PT LONG TERM GOAL #6  ? Title Pt. will improve gait speed to >0.8 m/s for community ambulation.   ? Baseline 0.6 m/s   ? Time 10   ? Period Weeks   ? Status New   ? Target Date 01/21/22   ? ?  ?  ? ?  ? ? ? ? ? ? ? ? Plan - 11/17/21 1532   ? ? Clinical Impression Statement Pt reported more lower back pain today from increased activities at home. SInce he got an injection for his hip we  avoided any strenuous exercise. MT was done to decrease pain and muscle tension causing pain along the SI joint area. Pt was a bit more unsteady w/o his cane with gait, he required cues for pace as he would get ahead of himself at times when stepping fwd. Continue progressing gait w/o AD and strengthening exercises to improve function.   ? Personal Factors and Comorbidities Comorbidity 3+   ? Comorbidities knee OA, history L ankle injuries, spinal stenosis, HTN   ? PT Frequency 2x / week   ? PT Duration 12 weeks   ? PT Treatment/Interventions ADLs/Self Care  Home Management;Moist Heat;Cryotherapy;Gait training;Stair training;Functional mobility training;Therapeutic activities;Therapeutic exercise;Balance training;Neuromuscular re-education;Patient/family education;Manual techniques   ? PT Next Visit Plan continue to progress LLE/LUE strength, balance and gait w/o AD   ? Consulted and Agree with Plan of Care Patient   ? ?  ?  ? ?  ? ? ?Patient will benefit from skilled therapeutic intervention in order to improve the following deficits and impairments:  Abnormal gait, Decreased endurance, Decreased strength, Decreased balance, Decreased mobility, Decreased safety awareness, Difficulty walking, Improper body mechanics, Impaired vision/preception ? ?Visit Diagnosis: ?Unsteadiness on feet ? ?Difficulty in walking, not elsewhere classified ? ?Muscle weakness (generalized) ? ? ? ? ?Problem List ?Patient Active Problem List  ? Diagnosis Date Noted  ? Cerebral thrombosis with cerebral infarction 06/17/2021  ? Essential hypertension 06/16/2021  ? Depression 06/16/2021  ? Osteoarthritis 06/16/2021  ? Insomnia 06/16/2021  ? Acute left-sided weakness 06/15/2021  ? ? ?Artist Pais, PTA ?11/17/2021, 5:52 PM ? ?[place] ?[facility] ?[address] ?[place], [place], [address] ?Phone: [phone]   Fax:  [phone] ? ?Name: Charles Hale ?MRN: [id number] ?Date of Birth: 1940-09-28 ? ? ? ?

## 2021-11-19 ENCOUNTER — Ambulatory Visit: Payer: PPO

## 2021-11-19 DIAGNOSIS — R2681 Unsteadiness on feet: Secondary | ICD-10-CM

## 2021-11-19 DIAGNOSIS — R262 Difficulty in walking, not elsewhere classified: Secondary | ICD-10-CM

## 2021-11-19 DIAGNOSIS — M6281 Muscle weakness (generalized): Secondary | ICD-10-CM

## 2021-11-19 NOTE — Therapy (Signed)
[place] ?[facility] ?[address] ?[place], [place], [address] ?Phone: [phone]   Fax:  [phone] ? ?Physical Therapy Treatment ? ?Patient Details  ?Name: Charles Hale ?MRN: [id number] ?Date of Birth: February 22, 1941 ?Referring Provider (PT): Bulla, Eather Colas ? ? ?Encounter Date: 11/19/2021 ? ? PT End of Session - 11/19/21 1540   ? ? Visit Number 23   ? Number of Visits 40   ? Date for PT Re-Evaluation 01/21/22   ? Authorization Type HT advantage   ? Progress Note Due on Visit 30   ? PT Start Time 4196   ? PT Stop Time 1528   ? PT Time Calculation (min) 42 min   ? Activity Tolerance Patient tolerated treatment well   ? Behavior During Therapy [facility] for tasks assessed/performed   ? ?  ?  ? ?  ? ? ?Past Medical History:  ?Diagnosis Date  ? Anxiety   ? Arthritis   ? Hypertension   ? Spinal stenosis   ? ? ?Past Surgical History:  ?Procedure Laterality Date  ? SHOULDER ARTHROSCOPY    ? ? ?There were no vitals filed for this visit. ? ? Subjective Assessment - 11/19/21 1452   ? ? Subjective Pt is doing good, no SI joint pain, no heaviness in L leg.   ? Pertinent History history chronic LBP, stenosis, L ankle fracture, L ankle severe sprain, OA R knee   ? Diagnostic tests 08/16/21- IMPRESSION:  1. Small focus of acute ischemia within the ventral right medulla  oblongata. No hemorrhage or mass effect.  2. Advanced atrophy and chronic ischemic microangiopathy.   ? Patient Stated Goals "walk"   ? Currently in Pain? No/denies   ? ?  ?  ? ?  ? ? ? ? ? ? ? ? ? ? ? ? ? ? ? ? ? ? ? ? [place] Adult PT Treatment/Exercise - 11/19/21 0001   ? ?  ? Ambulation/Gait  ? Ambulation/Gait Yes   ? Gait Comments gait 350 ft no AD; cues for pacing and for symmertircal step length   ?  ? Lumbar Exercises: Standing  ? Other Standing Lumbar Exercises palloff press Blue TB 20x each way   ? Other Standing Lumbar Exercises trunk rotations with Blue TB x 10 each way   ?  ? Knee/Hip Exercises:  Standing  ? Other Standing Knee Exercises sidesteps and backward walks  with GTB at knees 3x each along countertop   ?  ? Shoulder Exercises: Standing  ? Other Standing Exercises bicep curls 3x10 6#   ?  ? Shoulder Exercises: ROM/Strengthening  ? Cybex Row Limitations 25# L UE 2x10   ? ?  ?  ? ?  ? ? ? ? ? ? ? ? ? ? ? ? PT Short Term Goals - 09/29/21 1455   ? ?  ? PT SHORT TERM GOAL #1  ? Title Ind. with initial HEP   ? Baseline has HEP from HHPT   ? Time 2   ? Period Weeks   ? Status Achieved   ? Target Date 09/11/21   ?  ? PT SHORT TERM GOAL #2  ? Title Pt. will demonstrate improved midline orientation with no weight shift to R with standing, transfers, or walking.   ? Baseline weight shifts and leans to R   ? Time 6   ? Period Weeks   ? Status Achieved   ? Target Date 10/09/21   ?  ?  PT SHORT TERM GOAL #3  ? Title Pt. will demonstrate improved L ankle eversion strength to 3/5 for safety in gait   ? Baseline 1/5 L ankle, rolls, using brace   ? Time 6   ? Period Weeks   ? Status Achieved   09/29/21- 4+/5 L ankle everson.  ? Target Date 10/09/21   ? ?  ?  ? ?  ? ? ? ? PT Long Term Goals - 11/12/21 1458   ? ?  ? PT LONG TERM GOAL #1  ? Title Pt. will be independent with progressed HEP to improve outcomes.   ? Time 12   ? Period Weeks   ? Status Achieved   09/29/21- met for current.  ? Target Date 11/20/21   ?  ? PT LONG TERM GOAL #2  ? Title Pt. will be able to ambulate 1000' safely without AD for safety with community ambulation.   ? Time 12   ? Period Weeks   ? Status On-going   09/29/21- 500' without AD and CGA for safety, cues for R heel strike.  11/12/21- 54' without AD before LOB due to fatigue/distraction  ? Target Date 01/21/22   ?  ? PT LONG TERM GOAL #3  ? Title Pt. will be able to ascend/descend 12 stairs safely with 1 HR to access home.   ? Time 12   ? Period Weeks   ? Status On-going   09/22/21- can go up at home, but still difficulty descending  11/12/21- step to gait and HR on stairs, fatigued quickly  ?  Target Date 01/21/22   ?  ? PT LONG TERM GOAL #4  ? Title Pt. will be able to maintain tandem stance x 30 sec bil to demonstrate improved balance.   ? Baseline unable to maintain tandem stance   ? Time 12   ? Period Weeks   ? Status On-going   09/29/21- semi tandem 35 sec R, 19 sec L. Unable to maintain tandem stance.  10/21/21- 15 sec R foot forward, 5 sec L foot foward tandem  11/12/21- 10 sec bil  ? Target Date 01/21/22   ?  ? PT LONG TERM GOAL #5  ? Title Pt. will score at least 62% on FOTO to demonstrate improved functional mobility   ? Baseline 53% (47% disability)   ? Time 12   ? Period Weeks   ? Status Achieved   65%  ? Target Date 11/20/21   ?  ? Additional Long Term Goals  ? Additional Long Term Goals Yes   ?  ? PT LONG TERM GOAL #6  ? Title Pt. will improve gait speed to >0.8 m/s for community ambulation.   ? Baseline 0.6 m/s   ? Time 10   ? Period Weeks   ? Status New   ? Target Date 01/21/22   ? ?  ?  ? ?  ? ? ? ? ? ? ? ? Plan - 11/19/21 1540   ? ? Clinical Impression Statement Pt was doing much better this afternoon. We were able to progress distance with gait training w/o AD, still requiring cues for pace and to increase step length. He was challenged with the sidesteps and retro gait, had LOB a couple of times but able to self recover. Interventions focused on balance and strengthening in WB with no UE support. Pt responded well, can keep progressing exercises.   ? Personal Factors and Comorbidities Comorbidity 3+   ? Comorbidities knee  OA, history L ankle injuries, spinal stenosis, HTN   ? PT Frequency 2x / week   ? PT Duration 12 weeks   ? PT Treatment/Interventions ADLs/Self Care Home Management;Moist Heat;Cryotherapy;Gait training;Stair training;Functional mobility training;Therapeutic activities;Therapeutic exercise;Balance training;Neuromuscular re-education;Patient/family education;Manual techniques   ? PT Next Visit Plan continue to progress LLE/LUE strength, balance and gait w/o AD   ? Consulted  and Agree with Plan of Care Patient   ? ?  ?  ? ?  ? ? ?Patient will benefit from skilled therapeutic intervention in order to improve the following deficits and impairments:  Abnormal gait, Decreased endurance, Decreased strength, Decreased balance, Decreased mobility, Decreased safety awareness, Difficulty walking, Improper body mechanics, Impaired vision/preception ? ?Visit Diagnosis: ?Unsteadiness on feet ? ?Difficulty in walking, not elsewhere classified ? ?Muscle weakness (generalized) ? ? ? ? ?Problem List ?Patient Active Problem List  ? Diagnosis Date Noted  ? Cerebral thrombosis with cerebral infarction 06/17/2021  ? Essential hypertension 06/16/2021  ? Depression 06/16/2021  ? Osteoarthritis 06/16/2021  ? Insomnia 06/16/2021  ? Acute left-sided weakness 06/15/2021  ? ? ?Artist Pais, PTA ?11/19/2021, 3:52 PM ? ?[place] ?[facility] ?[address] ?[place], [place], [address] ?Phone: [phone]   Fax:  [phone] ? ?Name: Charles Hale ?MRN: [id number] ?Date of Birth: 1940/08/05 ? ? ? ?

## 2021-11-24 ENCOUNTER — Ambulatory Visit: Payer: PPO

## 2021-11-24 DIAGNOSIS — R262 Difficulty in walking, not elsewhere classified: Secondary | ICD-10-CM

## 2021-11-24 DIAGNOSIS — R2681 Unsteadiness on feet: Secondary | ICD-10-CM

## 2021-11-24 DIAGNOSIS — M6281 Muscle weakness (generalized): Secondary | ICD-10-CM

## 2021-11-24 NOTE — Therapy (Signed)
[facility] ?[place] ?[place], [place], [address] ?Phone: [phone]   Fax:  [phone] ? ?Physical Therapy Treatment ? ?Patient Details  ?Name: Charles Hale ?MRN: [id number] ?Date of Birth: March 09, 1941 ?Referring Provider (PT): Bulla, Eather Colas ? ? ?Encounter Date: 11/24/2021 ? ? PT End of Session - 11/24/21 1444   ? ? Visit Number 24   ? Number of Visits 40   ? Date for PT Re-Evaluation 01/21/22   ? Authorization Type HT advantage   ? Progress Note Due on Visit 30   ? PT Start Time 1401   ? PT Stop Time 2482   ? PT Time Calculation (min) 44 min   ? Activity Tolerance Patient tolerated treatment well   ? Behavior During Therapy [facility] for tasks assessed/performed   ? ?  ?  ? ?  ? ? ?Past Medical History:  ?Diagnosis Date  ? Anxiety   ? Arthritis   ? Hypertension   ? Spinal stenosis   ? ? ?Past Surgical History:  ?Procedure Laterality Date  ? SHOULDER ARTHROSCOPY    ? ? ?There were no vitals filed for this visit. ? ? Subjective Assessment - 11/24/21 1403   ? ? Subjective Notes feeling tired today, had park far off in parking lot and did exercises before coming here.   ? Pertinent History history chronic LBP, stenosis, L ankle fracture, L ankle severe sprain, OA R knee   ? Diagnostic tests 08/16/21- IMPRESSION:  1. Small focus of acute ischemia within the ventral right medulla  oblongata. No hemorrhage or mass effect.  2. Advanced atrophy and chronic ischemic microangiopathy.   ? Patient Stated Goals "walk"   ? Currently in Pain? Yes   ? Pain Score 2    ? Pain Location Hip   ? Pain Orientation Right;Posterior   ? Pain Descriptors / Indicators Aching   ? Pain Type Chronic pain   ? ?  ?  ? ?  ? ? ? ? ? ? ? ? ? ? ? ? ? ? ? ? ? ? ? ? [facility] PT Treatment/Exercise - 11/24/21 0001   ? ?  ? Ambulation/Gait  ? Ambulation/Gait Yes   ? Ambulation Distance (Feet) 370 Feet   ? Gait Comments gait w/o AD; cues for pacing and for symmertircal step length,  occasional LOB   ?  ? Exercises  ? Exercises Knee/Hip   ?  ? Knee/Hip Exercises: Aerobic  ? Nustep L7x67mn   ?  ? Knee/Hip Exercises: Machines for Strengthening  ? Cybex Knee Extension 15# R/L 2 x 10   ? Cybex Knee Flexion 15# R/L 2 x 10   ?  ? Knee/Hip Exercises: Seated  ? Other Seated Knee/Hip Exercises seated DL with 7# weight x 10   ?  ? Shoulder Exercises: Seated  ? Other Seated Exercises shoulder presses with 7lb DB x 10; 10 reps with orange weight ball   ? Other Seated Exercises chest press 2x10 with orange weighted   ? ?  ?  ? ?  ? ? ? ? ? ? ? ? ? ? ? ? PT Short Term Goals - 09/29/21 1455   ? ?  ? PT SHORT TERM GOAL #1  ? Title Ind. with initial HEP   ? Baseline has HEP from HHPT   ? Time 2   ? Period Weeks   ? Status Achieved   ? Target Date 09/11/21   ?  ?  PT SHORT TERM GOAL #2  ? Title Pt. will demonstrate improved midline orientation with no weight shift to R with standing, transfers, or walking.   ? Baseline weight shifts and leans to R   ? Time 6   ? Period Weeks   ? Status Achieved   ? Target Date 10/09/21   ?  ? PT SHORT TERM GOAL #3  ? Title Pt. will demonstrate improved L ankle eversion strength to 3/5 for safety in gait   ? Baseline 1/5 L ankle, rolls, using brace   ? Time 6   ? Period Weeks   ? Status Achieved   09/29/21- 4+/5 L ankle everson.  ? Target Date 10/09/21   ? ?  ?  ? ?  ? ? ? ? PT Long Term Goals - 11/24/21 1415   ? ?  ? PT LONG TERM GOAL #1  ? Title Pt. will be independent with progressed HEP to improve outcomes.   ? Time 12   ? Period Weeks   ? Status Achieved   09/29/21- met for current.  ? Target Date 11/20/21   ?  ? PT LONG TERM GOAL #2  ? Title Pt. will be able to ambulate 1000' safely without AD for safety with community ambulation.   ? Time 12   ? Period Weeks   ? Status On-going   11/24/21- limited to 370 ft today  ? Target Date 01/21/22   ?  ? PT LONG TERM GOAL #3  ? Title Pt. will be able to ascend/descend 12 stairs safely with 1 HR to access home.   ? Time 12   ? Period Weeks    ? Status On-going   09/22/21- can go up at home, but still difficulty descending  11/12/21- step to gait and HR on stairs, fatigued quickly  ? Target Date 01/21/22   ?  ? PT LONG TERM GOAL #4  ? Title Pt. will be able to maintain tandem stance x 30 sec bil to demonstrate improved balance.   ? Baseline unable to maintain tandem stance   ? Time 12   ? Period Weeks   ? Status On-going   09/29/21- semi tandem 35 sec R, 19 sec L. Unable to maintain tandem stance.  10/21/21- 15 sec R foot forward, 5 sec L foot foward tandem  11/12/21- 10 sec bil  ? Target Date 01/21/22   ?  ? PT LONG TERM GOAL #5  ? Title Pt. will score at least 62% on FOTO to demonstrate improved functional mobility   ? Baseline 53% (47% disability)   ? Time 12   ? Period Weeks   ? Status Achieved   65%  ? Target Date 11/20/21   ?  ? PT LONG TERM GOAL #6  ? Title Pt. will improve gait speed to >0.8 m/s for community ambulation.   ? Baseline 0.6 m/s   ? Time 10   ? Period Weeks   ? Status New   ? Target Date 01/21/22   ? ?  ?  ? ?  ? ? ? ? ? ? ? ? Plan - 11/24/21 1447   ? ? Clinical Impression Statement Pt was more fatigued today so he was limited to only 370 ft of gait training w/o AD. Pt did show more unsteadiness, with occasional LOB during gait. Since his legs were feeling tired today, we did more seated exercises working on core, UE, and LE strengthening. He did become fatigued with shld  press using 7# DB, so 2nd set we used the orange weighted ball. Pt responded well and is making progress toward goals.   ? Personal Factors and Comorbidities Comorbidity 3+   ? Comorbidities knee OA, history L ankle injuries, spinal stenosis, HTN   ? PT Frequency 2x / week   ? PT Duration 12 weeks   ? PT Treatment/Interventions ADLs/Self Care Home Management;Moist Heat;Cryotherapy;Gait training;Stair training;Functional mobility training;Therapeutic activities;Therapeutic exercise;Balance training;Neuromuscular re-education;Patient/family education;Manual techniques   ? PT  Next Visit Plan continue to progress LLE/LUE strength, balance and gait w/o AD   ? Consulted and Agree with Plan of Care Patient   ? ?  ?  ? ?  ? ? ?Patient will benefit from skilled therapeutic intervention in order to improve the following deficits and impairments:  Abnormal gait, Decreased endurance, Decreased strength, Decreased balance, Decreased mobility, Decreased safety awareness, Difficulty walking, Improper body mechanics, Impaired vision/preception ? ?Visit Diagnosis: ?Unsteadiness on feet ? ?Difficulty in walking, not elsewhere classified ? ?Muscle weakness (generalized) ? ? ? ? ?Problem List ?Patient Active Problem List  ? Diagnosis Date Noted  ? Cerebral thrombosis with cerebral infarction 06/17/2021  ? Essential hypertension 06/16/2021  ? Depression 06/16/2021  ? Osteoarthritis 06/16/2021  ? Insomnia 06/16/2021  ? Acute left-sided weakness 06/15/2021  ? ? ?Artist Pais, PTA ?11/24/2021, 4:36 PM ? ?[facility] ?[place] ?[place], [place], [address] ?Phone: [phone]   Fax:  [phone] ? ?Name: ELDO UMANZOR ?MRN: [id number] ?Date of Birth: 1940/08/10 ? ? ? ?

## 2021-11-27 NOTE — Therapy (Signed)
?OUTPATIENT PHYSICAL THERAPY TREATMENT NOTE ? ? ?Patient Name: Charles Hale ?MRN: [id number] ?DOB:1940-12-18, 81 y.o., male ?Today's Date: 11/30/2021 ? ?PCP: Egbert Garibaldi PA-C  ?REFERRING PROVIDER: Egbert Garibaldi PA-C ? ? PT End of Session - 11/30/21 1348   ? ? Visit Number 25   ? Number of Visits 40   ? Date for PT Re-Evaluation 01/21/22   ? Authorization Type HT advantage   ? Progress Note Due on Visit 30   ? PT Start Time 1350   ? PT Stop Time 4765   ? PT Time Calculation (min) 55 min   ? Activity Tolerance Patient tolerated treatment well   ? Behavior During Therapy [facility] for tasks assessed/performed   ? ?  ?  ? ?  ? ? ?Past Medical History:  ?Diagnosis Date  ? Anxiety   ? Arthritis   ? Hypertension   ? Spinal stenosis   ? ?Past Surgical History:  ?Procedure Laterality Date  ? SHOULDER ARTHROSCOPY    ? ?Patient Active Problem List  ? Diagnosis Date Noted  ? Cerebral thrombosis with cerebral infarction 06/17/2021  ? Essential hypertension 06/16/2021  ? Depression 06/16/2021  ? Osteoarthritis 06/16/2021  ? Insomnia 06/16/2021  ? Acute left-sided weakness 06/15/2021  ? ? ?REFERRING DIAG: I63.329 (ICD-10-CM) - Cerebral infarction due to thrombosis of unspecified ? ?THERAPY DIAG:  ?Unsteadiness on feet ? ?Difficulty in walking, not elsewhere classified ? ?Muscle weakness (generalized) ? ?PERTINENT HISTORY: anterior cerebral artery  CVA on 08/14/21, history chronic LBP, L ankle fracture/sprain, R knee OA/pain ? ?PRECAUTIONS: fall ? ?SUBJECTIVE: Pt. Reports some frustration, feels like he lacks stamina and wants to sleep all the time.  ? ?PAIN:  ?Are you having pain? Yes: NPRS scale: 2-3/10 ?Pain location: low back, numbness in feet.  ? ? ?OBJECTIVE: (objective measures completed at initial evaluation unless otherwise dated) ?FOTO CVA: 53%.  Predicted outcome 62% after 17 visits.  65% 10/01/21    ?     ?Posture/Postural Control    ?Posture/Postural Control Postural limitations     ?Postural Limitations Rounded  Shoulders;Forward head;Decreased lumbar lordosis;Weight shift right     ?     ?ROM / Strength    ?AROM / PROM / Strength Strength     ?     ?Strength    ?Overall Strength Deficits     ?Overall Strength Comments tested in sitting     ?Strength Assessment Site Hip;Knee;Ankle     ?Right/Left Hip Right;Left     ?Right Hip Flexion 5/5     ?Right Hip ABduction 5/5     ?Right Hip ADduction 5/5     ?Left Hip Flexion 4+/5     ?Left Hip ABduction 5/5     ?Left Hip ADduction 5/5     ?Right/Left Knee Right;Left     ?Right Knee Flexion 5/5     ?Right Knee Extension 5/5     ?Left Knee Flexion 4+/5     ?Left Knee Extension 5/5     ?Right/Left Ankle Right;Left     ?Right Ankle Dorsiflexion 5/5     ?Right Ankle Plantar Flexion 5/5     ?Right Ankle Inversion 5/5     ?Right Ankle Eversion 5/5     ?Left Ankle Dorsiflexion 4/5     ?Left Ankle Plantar Flexion 4/5     ?Left Ankle Inversion 4/5     ?Left Ankle Eversion 1/5  09/29/21- 4+/5   ?     ?Ambulation/Gait    ?Ambulation/Gait Yes     ?  Ambulation/Gait Assistance 5: Supervision     ?Ambulation/Gait Assistance Details SBA, unsteady, gait deviaitons     ?Ambulation Distance (Feet) 50 Feet     ?Assistive device Straight cane     ?Gait Pattern Step-through pattern;Decreased weight shift to left;Lateral trunk lean to right;Wide base of support     ?     ?Balance    ?Balance Assessed Yes     ?     ?Static Standing Balance    ?Static Standing - Balance Support No upper extremity supported     ?Static Standing - Level of Assistance 5: Stand by assistance     ?Static Standing Balance -  Activities  Single Leg Stance - Right Leg;Single Leg Stance - Left Leg;Tandam Stance - Right Leg;Tandam Stance - Left Leg;Romberg - Eyes Opened     ?Static Standing - Comment/# of Minutes able to stand 30 seconds without assist, unable to get into tandem stance, SLS x 3 sec R, O sec L     ?     ?Standardized Balance Assessment    ?Standardized Balance Assessment Timed Up and Go Test;Five Times Sit to Stand      ?Five times sit to stand comments  UE assist 13.5 sec     ?     ?Timed Up and Go Test    ?TUG Normal TUG     ?Normal TUG (seconds) 20   with SPC    ? ? ? ?TODAY'S TREATMENT:  ?11/30/2021 Therapeutic Exercise: to improve strength and mobility.  Verbal and tactile cues throughout for technique. ?Elliptical x 3 min (UE and LE)  ?Sled push (10lb added to front of sled) - 2 x 360'.   ?10xSTS ? ?Neuromuscular Reeducation: to improve balance and stability. SBA/CGA for safety throughout.  ?Diagonal reaches for cones on foam rollers 3 x 10bil  ?Star excursion pattern (180deg) x 10 bil  ?Rocker board frontal plane  2 x 10  ?Standing on rocker board with toes elevated, raising hands while maintaining balance x 10 bil  ? ? ?PATIENT EDUCATION: ?Education details: education on ways to help with mood, make sure eat and get out of house, activities, given information on [place] ?Person educated: Patient ?Education method: Explanation and Handouts ?Education comprehension: verbalized understanding ? ? ?HOME EXERCISE PROGRAM: ?[id number] ? ?ASSESSMENT: ?Clinical Impression: Charles Hale reports some signs of depression that may be affecting his mood and energy.  He has not been eating well and losing weight.  We talked about this today, he will try to get out of house more.  Will refer to counseling if needed.  Focused interventions on improving stamina and balance today.  He was very challenged with balance board initially, and also noted some neglect with star excursion, needing cues to go all direction with L foot as well as right.  Overall he is making good progress and would benefit from continued skilled therapy.  ? ? PT Short Term Goals - 09/29/21 1455   ? ?  ? PT SHORT TERM GOAL #1  ? Title Ind. with initial HEP   ? Baseline has HEP from HHPT   ? Time 2   ? Period Weeks   ? Status Achieved   ? Target Date 09/11/21   ?  ? PT SHORT TERM GOAL #2  ? Title Pt. will demonstrate improved midline orientation with no  weight shift to R with standing, transfers, or walking.   ? Baseline weight shifts and leans to R   ?  Time 6   ? Period Weeks   ? Status Achieved   ? Target Date 10/09/21   ?  ? PT SHORT TERM GOAL #3  ? Title Pt. will demonstrate improved L ankle eversion strength to 3/5 for safety in gait   ? Baseline 1/5 L ankle, rolls, using brace   ? Time 6   ? Period Weeks   ? Status Achieved   09/29/21- 4+/5 L ankle everson.  ? Target Date 10/09/21   ? ?  ?  ? ?  ? ? ? PT Long Term Goals - 11/24/21 1415   ? ?  ? PT LONG TERM GOAL #1  ? Title Pt. will be independent with progressed HEP to improve outcomes.   ? Time 12   ? Period Weeks   ? Status Achieved   09/29/21- met for current.  ? Target Date 11/20/21   ?  ? PT LONG TERM GOAL #2  ? Title Pt. will be able to ambulate 1000' safely without AD for safety with community ambulation.   ? Time 12   ? Period Weeks   ? Status On-going   11/24/21- limited to 370 ft today  ? Target Date 01/21/22   ?  ? PT LONG TERM GOAL #3  ? Title Pt. will be able to ascend/descend 12 stairs safely with 1 HR to access home.   ? Time 12   ? Period Weeks   ? Status On-going   09/22/21- can go up at home, but still difficulty descending  11/12/21- step to gait and HR on stairs, fatigued quickly  ? Target Date 01/21/22   ?  ? PT LONG TERM GOAL #4  ? Title Pt. will be able to maintain tandem stance x 30 sec bil to demonstrate improved balance.   ? Baseline unable to maintain tandem stance   ? Time 12   ? Period Weeks   ? Status On-going   09/29/21- semi tandem 35 sec R, 19 sec L. Unable to maintain tandem stance.  10/21/21- 15 sec R foot forward, 5 sec L foot foward tandem  11/12/21- 10 sec bil  ? Target Date 01/21/22   ?  ? PT LONG TERM GOAL #5  ? Title Pt. will score at least 62% on FOTO to demonstrate improved functional mobility   ? Baseline 53% (47% disability)   ? Time 12   ? Period Weeks   ? Status Achieved   65%  ? Target Date 11/20/21   ?  ? PT LONG TERM GOAL #6  ? Title Pt. will improve gait speed to >0.8  m/s for community ambulation.   ? Baseline 0.6 m/s   ? Time 10   ? Period Weeks   ? Status New   ? Target Date 01/21/22   ? ?  ?  ? ?  ? ?PLAN:  ? ? ?PT Frequency 2x / week   ?PT Duration 12 weeks   ?PT Treatmen

## 2021-11-30 ENCOUNTER — Encounter: Payer: Self-pay | Admitting: Physical Therapy

## 2021-11-30 ENCOUNTER — Ambulatory Visit: Payer: PPO | Admitting: Physical Therapy

## 2021-11-30 DIAGNOSIS — R2681 Unsteadiness on feet: Secondary | ICD-10-CM

## 2021-11-30 DIAGNOSIS — M6281 Muscle weakness (generalized): Secondary | ICD-10-CM

## 2021-11-30 DIAGNOSIS — R262 Difficulty in walking, not elsewhere classified: Secondary | ICD-10-CM

## 2021-12-02 ENCOUNTER — Ambulatory Visit: Payer: PPO

## 2021-12-02 DIAGNOSIS — R2681 Unsteadiness on feet: Secondary | ICD-10-CM

## 2021-12-02 DIAGNOSIS — R262 Difficulty in walking, not elsewhere classified: Secondary | ICD-10-CM

## 2021-12-02 DIAGNOSIS — M6281 Muscle weakness (generalized): Secondary | ICD-10-CM

## 2021-12-02 NOTE — Therapy (Signed)
?OUTPATIENT PHYSICAL THERAPY TREATMENT NOTE ? ? ?Patient Name: Charles Hale ?MRN: [id number] ?DOB:July 07, 1941, 81 y.o., male ?Today's Date: 12/02/2021 ? ?PCP: Egbert Garibaldi PA-C  ?REFERRING PROVIDER: Egbert Garibaldi PA-C ? ? PT End of Session - 12/02/21 1527   ? ? Visit Number 26   ? Number of Visits 40   ? Date for PT Re-Evaluation 01/21/22   ? Authorization Type HT advantage   ? Progress Note Due on Visit 30   ? PT Start Time [id number]   ? PT Stop Time 1526   ? PT Time Calculation (min) 47 min   ? Activity Tolerance Patient tolerated treatment well   ? Behavior During Therapy [facility] for tasks assessed/performed   ? ?  ?  ? ?  ? ? ? ?Past Medical History:  ?Diagnosis Date  ? Anxiety   ? Arthritis   ? Hypertension   ? Spinal stenosis   ? ?Past Surgical History:  ?Procedure Laterality Date  ? SHOULDER ARTHROSCOPY    ? ?Patient Active Problem List  ? Diagnosis Date Noted  ? Cerebral thrombosis with cerebral infarction 06/17/2021  ? Essential hypertension 06/16/2021  ? Depression 06/16/2021  ? Osteoarthritis 06/16/2021  ? Insomnia 06/16/2021  ? Acute left-sided weakness 06/15/2021  ? ? ?REFERRING DIAG: I63.329 (ICD-10-CM) - Cerebral infarction due to thrombosis of unspecified ? ?THERAPY DIAG:  ?Unsteadiness on feet ? ?Difficulty in walking, not elsewhere classified ? ?Muscle weakness (generalized) ? ?PERTINENT HISTORY: anterior cerebral artery  CVA on 08/14/21, history chronic LBP, L ankle fracture/sprain, R knee OA/pain ? ?PRECAUTIONS: fall ? ?SUBJECTIVE: Pt notes more energy today since they switched his BP medication. ? ?PAIN:  ?Are you having pain? Yes: NPRS scale: 0/10 ?Pain location: N/A ? ? ?OBJECTIVE: (objective measures completed at initial evaluation unless otherwise dated) ?FOTO CVA: 53%.  Predicted outcome 62% after 17 visits.  65% 10/01/21    ?     ?Posture/Postural Control    ?Posture/Postural Control Postural limitations     ?Postural Limitations Rounded Shoulders;Forward head;Decreased lumbar lordosis;Weight shift  right     ?     ?ROM / Strength    ?AROM / PROM / Strength Strength     ?     ?Strength    ?Overall Strength Deficits     ?Overall Strength Comments tested in sitting     ?Strength Assessment Site Hip;Knee;Ankle     ?Right/Left Hip Right;Left     ?Right Hip Flexion 5/5     ?Right Hip ABduction 5/5     ?Right Hip ADduction 5/5     ?Left Hip Flexion 4+/5     ?Left Hip ABduction 5/5     ?Left Hip ADduction 5/5     ?Right/Left Knee Right;Left     ?Right Knee Flexion 5/5     ?Right Knee Extension 5/5     ?Left Knee Flexion 4+/5     ?Left Knee Extension 5/5     ?Right/Left Ankle Right;Left     ?Right Ankle Dorsiflexion 5/5     ?Right Ankle Plantar Flexion 5/5     ?Right Ankle Inversion 5/5     ?Right Ankle Eversion 5/5     ?Left Ankle Dorsiflexion 4/5     ?Left Ankle Plantar Flexion 4/5     ?Left Ankle Inversion 4/5     ?Left Ankle Eversion 1/5  09/29/21- 4+/5   ?     ?Ambulation/Gait    ?Ambulation/Gait Yes     ?Ambulation/Gait Assistance 5: Supervision     ?  Ambulation/Gait Assistance Details SBA, unsteady, gait deviaitons     ?Ambulation Distance (Feet) 50 Feet     ?Assistive device Straight cane     ?Gait Pattern Step-through pattern;Decreased weight shift to left;Lateral trunk lean to right;Wide base of support     ?     ?Balance    ?Balance Assessed Yes     ?     ?Static Standing Balance    ?Static Standing - Balance Support No upper extremity supported     ?Static Standing - Level of Assistance 5: Stand by assistance     ?Static Standing Balance -  Activities  Single Leg Stance - Right Leg;Single Leg Stance - Left Leg;Tandam Stance - Right Leg;Tandam Stance - Left Leg;Romberg - Eyes Opened     ?Static Standing - Comment/# of Minutes able to stand 30 seconds without assist, unable to get into tandem stance, SLS x 3 sec R, O sec L     ?     ?Standardized Balance Assessment    ?Standardized Balance Assessment Timed Up and Go Test;Five Times Sit to Stand     ?Five times sit to stand comments  UE assist 13.5 sec     ?      ?Timed Up and Go Test    ?TUG Normal TUG     ?Normal TUG (seconds) 20   with SPC    ? ? ? ?TODAY'S TREATMENT:  ?12/02/21 ?Therapeutic Exercise: ?Nu Step L8x75mn ?STS with green TB at knees with seated elevated 2x10 ?Cybex knee extension 2x10 reps 15# LLE ? ?Neuromuscular Reeducation: ?Standing marches/hip abduction standing on airex 2x10 ?R HS curls while standing on airex pad x 10 ?Sidesteps with GTB at ankles 4x down and back on counter ?R hip abduction and adduction 10x each with cone tap  ? ?11/30/2021 Therapeutic Exercise: to improve strength and mobility.  Verbal and tactile cues throughout for technique. ?Elliptical x 3 min (UE and LE)  ?Sled push (10lb added to front of sled) - 2 x 360'.   ?10xSTS ? ?Neuromuscular Reeducation: to improve balance and stability. SBA/CGA for safety throughout.  ?Diagonal reaches for cones on foam rollers 3 x 10bil  ?Star excursion pattern (180deg) x 10 bil  ?Rocker board frontal plane  2 x 10  ?Standing on rocker board with toes elevated, raising hands while maintaining balance x 10 bil  ? ? ?PATIENT EDUCATION: ?Education details: education on ways to help with mood, make sure eat and get out of house, activities, [place] ?Person educated: Patient ?Education method: Explanation and Handouts ?Education comprehension: verbalized understanding ? ? ?HOME EXERCISE PROGRAM: ?[id number] ? ?ASSESSMENT: ?Clinical Impression: Pt presented to be in a much better mood today. We were able to progress balance exercises focusing on L SLS to improve stability. Cues were needed for controlled movement while standing on L LE. Cues required to reset postural alignment occasionally in between reps. He still has difficulty with STS from a standard height chair w/o UE support and required the seat to be elevated to perform w/o UE support. He is progressing well toward improving functional capabilities. ? ? PT Short Term Goals - 09/29/21 1455   ? ?  ? PT SHORT TERM  GOAL #1  ? Title Ind. with initial HEP   ? Baseline has HEP from HHPT   ? Time 2   ? Period Weeks   ? Status Achieved   ? Target Date 09/11/21   ?  ?  PT SHORT TERM GOAL #2  ? Title Pt. will demonstrate improved midline orientation with no weight shift to R with standing, transfers, or walking.   ? Baseline weight shifts and leans to R   ? Time 6   ? Period Weeks   ? Status Achieved   ? Target Date 10/09/21   ?  ? PT SHORT TERM GOAL #3  ? Title Pt. will demonstrate improved L ankle eversion strength to 3/5 for safety in gait   ? Baseline 1/5 L ankle, rolls, using brace   ? Time 6   ? Period Weeks   ? Status Achieved   09/29/21- 4+/5 L ankle everson.  ? Target Date 10/09/21   ? ?  ?  ? ?  ? ? ? PT Long Term Goals - 11/24/21 1415   ? ?  ? PT LONG TERM GOAL #1  ? Title Pt. will be independent with progressed HEP to improve outcomes.   ? Time 12   ? Period Weeks   ? Status Achieved   09/29/21- met for current.  ? Target Date 11/20/21   ?  ? PT LONG TERM GOAL #2  ? Title Pt. will be able to ambulate 1000' safely without AD for safety with community ambulation.   ? Time 12   ? Period Weeks   ? Status On-going   11/24/21- limited to 370 ft today  ? Target Date 01/21/22   ?  ? PT LONG TERM GOAL #3  ? Title Pt. will be able to ascend/descend 12 stairs safely with 1 HR to access home.   ? Time 12   ? Period Weeks   ? Status On-going   09/22/21- can go up at home, but still difficulty descending  11/12/21- step to gait and HR on stairs, fatigued quickly  ? Target Date 01/21/22   ?  ? PT LONG TERM GOAL #4  ? Title Pt. will be able to maintain tandem stance x 30 sec bil to demonstrate improved balance.   ? Baseline unable to maintain tandem stance   ? Time 12   ? Period Weeks   ? Status On-going   09/29/21- semi tandem 35 sec R, 19 sec L. Unable to maintain tandem stance.  10/21/21- 15 sec R foot forward, 5 sec L foot foward tandem  11/12/21- 10 sec bil  ? Target Date 01/21/22   ?  ? PT LONG TERM GOAL #5  ? Title Pt. will score at least 62%  on FOTO to demonstrate improved functional mobility   ? Baseline 53% (47% disability)   ? Time 12   ? Period Weeks   ? Status Achieved   65%  ? Target Date 11/20/21   ?  ? PT LONG TERM GOAL #6  ? Ti

## 2021-12-08 ENCOUNTER — Ambulatory Visit: Payer: PPO

## 2021-12-08 DIAGNOSIS — R2681 Unsteadiness on feet: Secondary | ICD-10-CM

## 2021-12-08 DIAGNOSIS — R262 Difficulty in walking, not elsewhere classified: Secondary | ICD-10-CM

## 2021-12-08 DIAGNOSIS — M6281 Muscle weakness (generalized): Secondary | ICD-10-CM

## 2021-12-08 NOTE — Therapy (Signed)
OUTPATIENT PHYSICAL THERAPY TREATMENT NOTE   Patient Name: Charles Hale MRN: [id number] DOB:12-01-1940, 81 y.o., male Today's Date: 12/08/2021  PCP: Emelia Loron  REFERRING PROVIDER: Emelia Loron   PT End of Session - 12/08/21 1617     Visit Number 27    Number of Visits 40    Date for PT Re-Evaluation 01/21/22    Authorization Type [employer]    Progress Note Due on Visit 99    PT Start Time 1532    PT Stop Time 7482    PT Time Calculation (min) 42 min    Activity Tolerance Patient tolerated treatment well    Behavior During Therapy [facility] for tasks assessed/performed               Past Medical History:  Diagnosis Date   Anxiety    Arthritis    Hypertension    Spinal stenosis    Past Surgical History:  Procedure Laterality Date   SHOULDER ARTHROSCOPY     Patient Active Problem List   Diagnosis Date Noted   Cerebral thrombosis with cerebral infarction 06/17/2021   Essential hypertension 06/16/2021   Depression 06/16/2021   Osteoarthritis 06/16/2021   Insomnia 06/16/2021   Acute left-sided weakness 06/15/2021    REFERRING DIAG: I63.329 (ICD-10-CM) - Cerebral infarction due to thrombosis of unspecified  THERAPY DIAG:  Unsteadiness on feet  Difficulty in walking, not elsewhere classified  Muscle weakness (generalized)  PERTINENT HISTORY: anterior cerebral artery  CVA on 08/14/21, history chronic LBP, L ankle fracture/sprain, R knee OA/pain  PRECAUTIONS: fall  SUBJECTIVE: Pt reports he was sore after last session, knee are still bothering him.  PAIN:  Are you having pain? No : NPRS scale: 0/10 Pain location: N/A   OBJECTIVE: (objective measures completed at initial evaluation unless otherwise dated) FOTO CVA: 53%.  Predicted outcome 62% after 17 visits.  65% 10/01/21         Posture/Postural Control    Posture/Postural Control Postural limitations     Postural Limitations Rounded Shoulders;Forward head;Decreased lumbar  lordosis;Weight shift right          ROM / Strength    AROM / PROM / Strength Strength          Strength    Overall Strength Deficits     Overall Strength Comments tested in sitting     Strength Assessment Site Hip;Knee;Ankle     Right/Left Hip Right;Left     Right Hip Flexion 5/5     Right Hip ABduction 5/5     Right Hip ADduction 5/5     Left Hip Flexion 4+/5     Left Hip ABduction 5/5     Left Hip ADduction 5/5     Right/Left Knee Right;Left     Right Knee Flexion 5/5     Right Knee Extension 5/5     Left Knee Flexion 4+/5     Left Knee Extension 5/5     Right/Left Ankle Right;Left     Right Ankle Dorsiflexion 5/5     Right Ankle Plantar Flexion 5/5     Right Ankle Inversion 5/5     Right Ankle Eversion 5/5     Left Ankle Dorsiflexion 4/5     Left Ankle Plantar Flexion 4/5     Left Ankle Inversion 4/5     Left Ankle Eversion 1/5  09/29/21- 4+/5        Ambulation/Gait    Ambulation/Gait Yes     Ambulation/Gait Assistance  5: Supervision     Ambulation/Gait Assistance Details SBA, unsteady, gait deviaitons     Ambulation Distance (Feet) 50 Feet     Assistive device Straight cane     Gait Pattern Step-through pattern;Decreased weight shift to left;Lateral trunk lean to right;Wide base of support          Balance    Balance Assessed Yes          Static Standing Balance    Static Standing - Balance Support No upper extremity supported     Static Standing - Level of Assistance 5: Stand by assistance     Static Standing Balance -  Activities  Single Leg Stance - Right Leg;Single Leg Stance - Left Leg;Tandam Stance - Right Leg;Tandam Stance - Left Leg;Romberg - Eyes Opened     Static Standing - Comment/# of Minutes able to stand 30 seconds without assist, unable to get into tandem stance, SLS x 3 sec R, O sec L          Standardized Balance Assessment    Standardized Balance Assessment Timed Up and Go Test;Five Times Sit to Stand     Five times sit to stand comments  UE  assist 13.5 sec          Timed Up and Go Test    TUG Normal TUG     Normal TUG (seconds) 20   with SPC       TODAY'S TREATMENT:  12/08/21 Therapeutic Exercise: Nu Step L8x72min  Neuromuscular Reeducation:   Marches standing on airex x 10   Hip extension standing on airex x 10   Hip throw standing on airex 2 x 10 with blue weight ball   Trunk rotation standing on airex 2 x 10 with blue TB   Monster Walks fwd and retro 5x10 ft  12/02/21 Therapeutic Exercise: Nu Step L8x59min STS with green TB at knees with seated elevated 2x10 Cybex knee extension 2x10 reps 15# LLE  Neuromuscular Reeducation: Standing marches/hip abduction standing on airex 2x10 R HS curls while standing on airex pad x 10 Sidesteps with GTB at ankles 4x down and back on counter R hip abduction and adduction 10x each with cone tap   11/30/2021 Therapeutic Exercise: to improve strength and mobility.  Verbal and tactile cues throughout for technique. Elliptical x 3 min (UE and LE)  Sled push (10lb added to front of sled) - 2 x 360'.   10xSTS  Neuromuscular Reeducation: to improve balance and stability. SBA/CGA for safety throughout.  Diagonal reaches for cones on foam rollers 3 x 10bil  Star excursion pattern (180deg) x 10 bil  Rocker board frontal plane  2 x 10  Standing on rocker board with toes elevated, raising hands while maintaining balance x 10 bil    PATIENT EDUCATION: Education details: education on ways to help with mood, make sure eat and get out of house, activities, given information on [place] educated: Patient Education method: Theatre stage manager Education comprehension: verbalized understanding   HOME EXERCISE PROGRAM: [id number]  ASSESSMENT: Clinical Impression:  Pt showed a bit more of a challenge with balance from airex today. He had LOB a few times while standing on the airex doing trunk rotations, mod A needed for recovery. Cues required with monster walks  for pacing and correct sequencing to avoid LOB. Overall he showed a good response to the progression of balance exercises.  PT Short Term Goals - 09/29/21 1455  PT SHORT TERM GOAL #1   Title Ind. with initial HEP    Baseline has HEP from HHPT    Time 2    Period Weeks    Status Achieved    Target Date 09/11/21      PT SHORT TERM GOAL #2   Title Pt. will demonstrate improved midline orientation with no weight shift to R with standing, transfers, or walking.    Baseline weight shifts and leans to R    Time 6    Period Weeks    Status Achieved    Target Date 10/09/21      PT SHORT TERM GOAL #3   Title Pt. will demonstrate improved L ankle eversion strength to 3/5 for safety in gait    Baseline 1/5 L ankle, rolls, using brace    Time 6    Period Weeks    Status Achieved   09/29/21- 4+/5 L ankle everson.   Target Date 10/09/21              PT Long Term Goals - 11/24/21 1415       PT LONG TERM GOAL #1   Title Pt. will be independent with progressed HEP to improve outcomes.    Time 12    Period Weeks    Status Achieved   09/29/21- met for current.   Target Date 11/20/21      PT LONG TERM GOAL #2   Title Pt. will be able to ambulate 1000' safely without AD for safety with community ambulation.    Time 12    Period Weeks    Status On-going   11/24/21- limited to 370 ft today   Target Date 01/21/22      PT LONG TERM GOAL #3   Title Pt. will be able to ascend/descend 12 stairs safely with 1 HR to access home.    Time 12    Period Weeks    Status On-going   09/22/21- can go up at home, but still difficulty descending  11/12/21- step to gait and HR on stairs, fatigued quickly   Target Date 01/21/22      PT LONG TERM GOAL #4   Title Pt. will be able to maintain tandem stance x 30 sec bil to demonstrate improved balance.    Baseline unable to maintain tandem stance    Time 12    Period Weeks    Status On-going   09/29/21- semi tandem 35 sec R, 19 sec L. Unable to maintain  tandem stance.  10/21/21- 15 sec R foot forward, 5 sec L foot foward tandem  11/12/21- 10 sec bil   Target Date 01/21/22      PT LONG TERM GOAL #5   Title Pt. will score at least 62% on FOTO to demonstrate improved functional mobility    Baseline 53% (47% disability)    Time 12    Period Weeks    Status Achieved   65%   Target Date 11/20/21      PT LONG TERM GOAL #6   Title Pt. will improve gait speed to >0.8 m/s for community ambulation.    Baseline 0.6 m/s    Time 10    Period Weeks    Status New    Target Date 01/21/22            PLAN:    PT Frequency 2x / week   PT Duration 12 weeks   PT Treatment/Interventions ADLs/Self Care Home Management;Moist Heat;Cryotherapy;Gait training;Stair training;Functional  mobility training;Therapeutic activities;Therapeutic exercise;Balance training;Neuromuscular re-education;Patient/family education;Manual techniques   PT Next Visit Plan continue to progress LLE/LUE strength, balance and gait                   Patient will benefit from skilled therapeutic intervention in order to improve the following deficits and impairments:  Abnormal gait, Decreased endurance, Decreased strength, Decreased balance, Decreased mobility, Decreased safety awareness, Difficulty walking, Improper body mechanics, Impaired vision/preception    Artist Pais, PTA 12/08/2021, 4:17 PM

## 2021-12-15 ENCOUNTER — Other Ambulatory Visit (HOSPITAL_COMMUNITY): Payer: Self-pay

## 2021-12-15 ENCOUNTER — Ambulatory Visit: Payer: PPO | Admitting: Physical Therapy

## 2021-12-15 ENCOUNTER — Encounter: Payer: Self-pay | Admitting: Physical Therapy

## 2021-12-15 DIAGNOSIS — R262 Difficulty in walking, not elsewhere classified: Secondary | ICD-10-CM

## 2021-12-15 DIAGNOSIS — R2681 Unsteadiness on feet: Secondary | ICD-10-CM

## 2021-12-15 DIAGNOSIS — M6281 Muscle weakness (generalized): Secondary | ICD-10-CM

## 2021-12-15 MED ORDER — OMEPRAZOLE 20 MG PO CPDR
DELAYED_RELEASE_CAPSULE | ORAL | 1 refills | Status: DC
  Filled 2021-12-15: qty 90, 90d supply, fill #0

## 2021-12-15 NOTE — Therapy (Signed)
OUTPATIENT PHYSICAL THERAPY TREATMENT NOTE   Patient Name: Charles Hale MRN: [id number] DOB:1941-01-24, 81 y.o., male Today's Date: 12/15/2021  PCP: Emelia Loron  REFERRING PROVIDER: Emelia Loron   PT End of Session - 12/15/21 1459     Visit Number 28    Number of Visits 40    Date for PT Re-Evaluation 01/21/22    Authorization Type [employer]    Progress Note Due on Visit 66    PT Start Time 1448    PT Stop Time 1541    PT Time Calculation (min) 53 min    Activity Tolerance Patient tolerated treatment well    Behavior During Therapy [facility] for tasks assessed/performed               Past Medical History:  Diagnosis Date   Anxiety    Arthritis    Hypertension    Spinal stenosis    Past Surgical History:  Procedure Laterality Date   SHOULDER ARTHROSCOPY     Patient Active Problem List   Diagnosis Date Noted   Cerebral thrombosis with cerebral infarction 06/17/2021   Essential hypertension 06/16/2021   Depression 06/16/2021   Osteoarthritis 06/16/2021   Insomnia 06/16/2021   Acute left-sided weakness 06/15/2021    REFERRING DIAG: I63.329 (ICD-10-CM) - Cerebral infarction due to thrombosis of unspecified  THERAPY DIAG:  Unsteadiness on feet  Difficulty in walking, not elsewhere classified  Muscle weakness (generalized)  PERTINENT HISTORY: anterior cerebral artery  CVA on 08/14/21, history chronic LBP, L ankle fracture/sprain, R knee OA/pain  PRECAUTIONS: fall  SUBJECTIVE: Pt reports he saw his doctor last week, scheduled for [place] for leg pain to rule out DVT.  Taking Aleve for pain.  Reports more energy.  PAIN:  Are you having pain? No : NPRS scale: 0/10 Pain location: N/A   OBJECTIVE: (objective measures completed at initial evaluation unless otherwise dated) FOTO CVA: 53%.  Predicted outcome 62% after 17 visits.  65% 10/01/21         Posture/Postural Control    Posture/Postural Control Postural limitations     Postural Limitations  Rounded Shoulders;Forward head;Decreased lumbar lordosis;Weight shift right          ROM / Strength    AROM / PROM / Strength Strength          Strength    Overall Strength Deficits     Overall Strength Comments tested in sitting     Strength Assessment Site Hip;Knee;Ankle     Right/Left Hip Right;Left     Right Hip Flexion 5/5     Right Hip ABduction 5/5     Right Hip ADduction 5/5     Left Hip Flexion 4+/5     Left Hip ABduction 5/5     Left Hip ADduction 5/5     Right/Left Knee Right;Left     Right Knee Flexion 5/5     Right Knee Extension 5/5     Left Knee Flexion 4+/5     Left Knee Extension 5/5     Right/Left Ankle Right;Left     Right Ankle Dorsiflexion 5/5     Right Ankle Plantar Flexion 5/5     Right Ankle Inversion 5/5     Right Ankle Eversion 5/5     Left Ankle Dorsiflexion 4/5     Left Ankle Plantar Flexion 4/5     Left Ankle Inversion 4/5     Left Ankle Eversion 1/5  09/29/21- 4+/5  Ambulation/Gait    Ambulation/Gait Yes     Ambulation/Gait Assistance 5: Supervision     Ambulation/Gait Assistance Details SBA, unsteady, gait deviaitons     Ambulation Distance (Feet) 50 Feet     Assistive device Straight cane     Gait Pattern Step-through pattern;Decreased weight shift to left;Lateral trunk lean to right;Wide base of support          Balance    Balance Assessed Yes          Static Standing Balance    Static Standing - Balance Support No upper extremity supported     Static Standing - Level of Assistance 5: Stand by assistance     Static Standing Balance -  Activities  Single Leg Stance - Right Leg;Single Leg Stance - Left Leg;Tandam Stance - Right Leg;Tandam Stance - Left Leg;Romberg - Eyes Opened     Static Standing - Comment/# of Minutes able to stand 30 seconds without assist, unable to get into tandem stance, SLS x 3 sec R, O sec L          Standardized Balance Assessment    Standardized Balance Assessment Timed Up and Go Test;Five Times Sit to  Stand     Five times sit to stand comments  UE assist 13.5 sec          Timed Up and Go Test    TUG Normal TUG     Normal TUG (seconds) 20   with SPC       TODAY'S TREATMENT:  12/15/21 Therapeutic Exercise: to improve strength and mobility.  Demo, verbal  throughout and SBA/CGA for safety. Treadmill 2 x 8 min 1.0 mph SBA for safety, cues for heel strike, to improve endurance Hip throws with arms overhead with weighted blue ball 3 x 10  Overhead strikes with bar 2 x 10 hitting ball and step through, followed by 2 x 10 stopping just before hitting ball, CGA for safety  12/08/21 Therapeutic Exercise: Nu Step L8x39mn  Neuromuscular Reeducation:   Marches standing on airex x 10   Hip extension standing on airex x 10   Hip throw standing on airex 2 x 10 with blue weight ball   Trunk rotation standing on airex 2 x 10 with blue TB   Monster Walks fwd and retro 5x10 ft  12/02/21 Therapeutic Exercise: Nu Step L8x655m STS with green TB at knees with seated elevated 2x10 Cybex knee extension 2x10 reps 15# LLE  Neuromuscular Reeducation: Standing marches/hip abduction standing on airex 2x10 R HS curls while standing on airex pad x 10 Sidesteps with GTB at ankles 4x down and back on counter R hip abduction and adduction 10x each with cone tap   11/30/2021 Therapeutic Exercise: to improve strength and mobility.  Verbal and tactile cues throughout for technique. Elliptical x 3 min (UE and LE)  Sled push (10lb added to front of sled) - 2 x 360'.   10xSTS  Neuromuscular Reeducation: to improve balance and stability. SBA/CGA for safety throughout.  Diagonal reaches for cones on foam rollers 3 x 10bil  Star excursion pattern (180deg) x 10 bil  Rocker board frontal plane  2 x 10  Standing on rocker board with toes elevated, raising hands while maintaining balance x 10 bil    PATIENT EDUCATION: Education details: education on ways to help with mood, make sure eat and get out of house,  activities, given information on [place] educated: Patient Education method: Explanation and Handouts  Education comprehension: verbalized understanding   HOME EXERCISE PROGRAM: [id number]  ASSESSMENT: Clinical Impression:  Focused skilled intervention today on improving endurance and gait with treadmill walking, seated rest breaks needed afterwards for muscle recovery, and full body movements for strengthening and balance, focusing on bringing arms overhead while looking up, this was very challenging due to acrophobia, unable to look fully overhead.  He also participated in stepping and striking target with pole to coordinate arm and foot movement, followed by stopping short for control, which was very challenging, several times having to place hands on plinth for balance recovery.  Noted significant improvement in mood with activities today.  He would benefit from continued skilled therapy.     PT Short Term Goals - 09/29/21 1455       PT SHORT TERM GOAL #1   Title Ind. with initial HEP    Baseline has HEP from HHPT    Time 2    Period Weeks    Status Achieved    Target Date 09/11/21      PT SHORT TERM GOAL #2   Title Pt. will demonstrate improved midline orientation with no weight shift to R with standing, transfers, or walking.    Baseline weight shifts and leans to R    Time 6    Period Weeks    Status Achieved    Target Date 10/09/21      PT SHORT TERM GOAL #3   Title Pt. will demonstrate improved L ankle eversion strength to 3/5 for safety in gait    Baseline 1/5 L ankle, rolls, using brace    Time 6    Period Weeks    Status Achieved   09/29/21- 4+/5 L ankle everson.   Target Date 10/09/21              PT Long Term Goals - 11/24/21 1415       PT LONG TERM GOAL #1   Title Pt. will be independent with progressed HEP to improve outcomes.    Time 12    Period Weeks    Status Achieved   09/29/21- met for current.   Target Date 11/20/21       PT LONG TERM GOAL #2   Title Pt. will be able to ambulate 1000' safely without AD for safety with community ambulation.    Time 12    Period Weeks    Status On-going   11/24/21- limited to 370 ft today   Target Date 01/21/22      PT LONG TERM GOAL #3   Title Pt. will be able to ascend/descend 12 stairs safely with 1 HR to access home.    Time 12    Period Weeks    Status On-going   09/22/21- can go up at home, but still difficulty descending  11/12/21- step to gait and HR on stairs, fatigued quickly   Target Date 01/21/22      PT LONG TERM GOAL #4   Title Pt. will be able to maintain tandem stance x 30 sec bil to demonstrate improved balance.    Baseline unable to maintain tandem stance    Time 12    Period Weeks    Status On-going   09/29/21- semi tandem 35 sec R, 19 sec L. Unable to maintain tandem stance.  10/21/21- 15 sec R foot forward, 5 sec L foot foward tandem  11/12/21- 10 sec bil   Target Date 01/21/22      PT LONG TERM GOAL #  5   Title Pt. will score at least 62% on FOTO to demonstrate improved functional mobility    Baseline 53% (47% disability)    Time 12    Period Weeks    Status Achieved   65%   Target Date 11/20/21      PT LONG TERM GOAL #6   Title Pt. will improve gait speed to >0.8 m/s for community ambulation.    Baseline 0.6 m/s    Time 10    Period Weeks    Status New    Target Date 01/21/22            PLAN:    PT Frequency 2x / week   PT Duration 12 weeks   PT Treatment/Interventions ADLs/Self Care Home Management;Moist Heat;Cryotherapy;Gait training;Stair training;Functional mobility training;Therapeutic activities;Therapeutic exercise;Balance training;Neuromuscular re-education;Patient/family education;Manual techniques   PT Next Visit Plan continue to progress LLE/LUE strength, balance and gait                   Patient will benefit from skilled therapeutic intervention in order to improve the following deficits and impairments:  Abnormal  gait, Decreased endurance, Decreased strength, Decreased balance, Decreased mobility, Decreased safety awareness, Difficulty walking, Improper body mechanics, Impaired vision/preception    Rennie Natter, PT, DPT  12/15/2021, 3:50 PM

## 2021-12-16 ENCOUNTER — Other Ambulatory Visit (HOSPITAL_COMMUNITY): Payer: Self-pay

## 2021-12-17 ENCOUNTER — Ambulatory Visit: Payer: PPO | Attending: Internal Medicine

## 2021-12-17 DIAGNOSIS — R2681 Unsteadiness on feet: Secondary | ICD-10-CM | POA: Insufficient documentation

## 2021-12-17 DIAGNOSIS — M6281 Muscle weakness (generalized): Secondary | ICD-10-CM | POA: Diagnosis present

## 2021-12-17 DIAGNOSIS — R262 Difficulty in walking, not elsewhere classified: Secondary | ICD-10-CM | POA: Diagnosis present

## 2021-12-17 NOTE — Therapy (Signed)
OUTPATIENT PHYSICAL THERAPY TREATMENT NOTE   Patient Name: Charles Hale MRN: [id number] DOB:Jun 05, 1941, 81 y.o., male Today's Date: 12/17/2021  PCP: Emelia Loron  REFERRING PROVIDER: Emelia Loron   PT End of Session - 12/17/21 1533     Visit Number 29    Number of Visits 40    Date for PT Re-Evaluation 01/21/22    Authorization Type [employer]    Progress Note Due on Visit 59    PT Start Time 4235    PT Stop Time 3614    PT Time Calculation (min) 45 min    Activity Tolerance Patient tolerated treatment well    Behavior During Therapy [facility] for tasks assessed/performed                Past Medical History:  Diagnosis Date   Anxiety    Arthritis    Hypertension    Spinal stenosis    Past Surgical History:  Procedure Laterality Date   SHOULDER ARTHROSCOPY     Patient Active Problem List   Diagnosis Date Noted   Cerebral thrombosis with cerebral infarction 06/17/2021   Essential hypertension 06/16/2021   Depression 06/16/2021   Osteoarthritis 06/16/2021   Insomnia 06/16/2021   Acute left-sided weakness 06/15/2021    REFERRING DIAG: I63.329 (ICD-10-CM) - Cerebral infarction due to thrombosis of unspecified  THERAPY DIAG:  Unsteadiness on feet  Difficulty in walking, not elsewhere classified  Muscle weakness (generalized)  PERTINENT HISTORY: anterior cerebral artery  CVA on 08/14/21, history chronic LBP, L ankle fracture/sprain, R knee OA/pain  PRECAUTIONS: fall  SUBJECTIVE: Pt reports no new complaints, still plans on having [place] on 6/14. PAIN:  Are you having pain? No : NPRS scale: 0/10 Pain location: N/A   OBJECTIVE: (objective measures completed at initial evaluation unless otherwise dated) FOTO CVA: 53%.  Predicted outcome 62% after 17 visits.  65% 10/01/21         Posture/Postural Control    Posture/Postural Control Postural limitations     Postural Limitations Rounded Shoulders;Forward head;Decreased lumbar lordosis;Weight shift  right          ROM / Strength    AROM / PROM / Strength Strength          Strength    Overall Strength Deficits     Overall Strength Comments tested in sitting     Strength Assessment Site Hip;Knee;Ankle     Right/Left Hip Right;Left     Right Hip Flexion 5/5     Right Hip ABduction 5/5     Right Hip ADduction 5/5     Left Hip Flexion 4+/5     Left Hip ABduction 5/5     Left Hip ADduction 5/5     Right/Left Knee Right;Left     Right Knee Flexion 5/5     Right Knee Extension 5/5     Left Knee Flexion 4+/5     Left Knee Extension 5/5     Right/Left Ankle Right;Left     Right Ankle Dorsiflexion 5/5     Right Ankle Plantar Flexion 5/5     Right Ankle Inversion 5/5     Right Ankle Eversion 5/5     Left Ankle Dorsiflexion 4/5     Left Ankle Plantar Flexion 4/5     Left Ankle Inversion 4/5     Left Ankle Eversion 1/5  09/29/21- 4+/5        Ambulation/Gait    Ambulation/Gait Yes     Ambulation/Gait Assistance 5:  Supervision     Ambulation/Gait Assistance Details SBA, unsteady, gait deviaitons     Ambulation Distance (Feet) 50 Feet     Assistive device Straight cane     Gait Pattern Step-through pattern;Decreased weight shift to left;Lateral trunk lean to right;Wide base of support          Balance    Balance Assessed Yes          Static Standing Balance    Static Standing - Balance Support No upper extremity supported     Static Standing - Level of Assistance 5: Stand by assistance     Static Standing Balance -  Activities  Single Leg Stance - Right Leg;Single Leg Stance - Left Leg;Tandam Stance - Right Leg;Tandam Stance - Left Leg;Romberg - Eyes Opened     Static Standing - Comment/# of Minutes able to stand 30 seconds without assist, unable to get into tandem stance, SLS x 3 sec R, O sec L          Standardized Balance Assessment    Standardized Balance Assessment Timed Up and Go Test;Five Times Sit to Stand     Five times sit to stand comments  UE assist 13.5 sec           Timed Up and Go Test    TUG Normal TUG     Normal TUG (seconds) 20   with SPC       TODAY'S TREATMENT:  12/17/21 Therapeutic Exercise: Treadmill x 10 min 1.0 mph increased speed by 0.2 each min for 3 min then consistent speed Fwd step and spinal extension 10x Monster walks fwd and back 2x23f Standing lat pull with GTB 2x10  Standing row with GTB x 10 Multifidus walkout with GTB double band x 6  12/15/21 Therapeutic Exercise: to improve strength and mobility.  Demo, verbal  throughout and SBA/CGA for safety. Treadmill 2 x 8 min 1.0 mph SBA for safety, cues for heel strike, to improve endurance Hip throws with arms overhead with weighted blue ball 3 x 10  Overhead strikes with bar 2 x 10 hitting ball and step through, followed by 2 x 10 stopping just before hitting ball, CGA for safety  12/08/21 Therapeutic Exercise: Nu Step [id number]  Neuromuscular Reeducation:   Marches standing on airex x 10   Hip extension standing on airex x 10   Hip throw standing on airex 2 x 10 with blue weight ball   Trunk rotation standing on airex 2 x 10 with blue TB   Monster Walks fwd and retro 5x10 ft  12/02/21 Therapeutic Exercise: Nu Step [id number] with green TB at knees with seated elevated 2x10 Cybex knee extension 2x10 reps 15# LLE  Neuromuscular Reeducation: Standing marches/hip abduction standing on airex 2x10 R HS curls while standing on airex pad x 10 Sidesteps with GTB at ankles 4x down and back on counter R hip abduction and adduction 10x each with cone tap      PATIENT EDUCATION: Education details: education on ways to help with mood, make sure eat and get out of house, activities, given information on [place] educated: Patient Education method: ExpTheatre stage managerucation comprehension: verbalized understanding   HOME EXERCISE PROGRAM: [id number]  ASSESSMENT: Clinical Impression:  Pt showed a good response to treatment. Focused session on  postural alignment, core activation, and balance. Cues required with fwd lunges for spinal extension. Cues for pacing required with retro monster walk. He showed more fatigue with interventions  today. Had LOB many times throughout session, pt able to self recover and CGA given to prevent falls.     PT Short Term Goals - 09/29/21 1455       PT SHORT TERM GOAL #1   Title Ind. with initial HEP    Baseline has HEP from HHPT    Time 2    Period Weeks    Status Achieved    Target Date 09/11/21      PT SHORT TERM GOAL #2   Title Pt. will demonstrate improved midline orientation with no weight shift to R with standing, transfers, or walking.    Baseline weight shifts and leans to R    Time 6    Period Weeks    Status Achieved    Target Date 10/09/21      PT SHORT TERM GOAL #3   Title Pt. will demonstrate improved L ankle eversion strength to 3/5 for safety in gait    Baseline 1/5 L ankle, rolls, using brace    Time 6    Period Weeks    Status Achieved   09/29/21- 4+/5 L ankle everson.   Target Date 10/09/21              PT Long Term Goals - 11/24/21 1415       PT LONG TERM GOAL #1   Title Pt. will be independent with progressed HEP to improve outcomes.    Time 12    Period Weeks    Status Achieved   09/29/21- met for current.   Target Date 11/20/21      PT LONG TERM GOAL #2   Title Pt. will be able to ambulate 1000' safely without AD for safety with community ambulation.    Time 12    Period Weeks    Status On-going   11/24/21- limited to 370 ft today   Target Date 01/21/22      PT LONG TERM GOAL #3   Title Pt. will be able to ascend/descend 12 stairs safely with 1 HR to access home.    Time 12    Period Weeks    Status On-going   09/22/21- can go up at home, but still difficulty descending  11/12/21- step to gait and HR on stairs, fatigued quickly   Target Date 01/21/22      PT LONG TERM GOAL #4   Title Pt. will be able to maintain tandem stance x 30 sec bil to  demonstrate improved balance.    Baseline unable to maintain tandem stance    Time 12    Period Weeks    Status On-going   09/29/21- semi tandem 35 sec R, 19 sec L. Unable to maintain tandem stance.  10/21/21- 15 sec R foot forward, 5 sec L foot foward tandem  11/12/21- 10 sec bil   Target Date 01/21/22      PT LONG TERM GOAL #5   Title Pt. will score at least 62% on FOTO to demonstrate improved functional mobility    Baseline 53% (47% disability)    Time 12    Period Weeks    Status Achieved   65%   Target Date 11/20/21      PT LONG TERM GOAL #6   Title Pt. will improve gait speed to >0.8 m/s for community ambulation.    Baseline 0.6 m/s    Time 10    Period Weeks    Status New    Target Date 01/21/22  PLAN:    PT Frequency 2x / week   PT Duration 12 weeks   PT Treatment/Interventions ADLs/Self Care Home Management;Moist Heat;Cryotherapy;Gait training;Stair training;Functional mobility training;Therapeutic activities;Therapeutic exercise;Balance training;Neuromuscular re-education;Patient/family education;Manual techniques   PT Next Visit Plan continue to progress LLE/LUE strength, balance and gait                   Patient will benefit from skilled therapeutic intervention in order to improve the following deficits and impairments:  Abnormal gait, Decreased endurance, Decreased strength, Decreased balance, Decreased mobility, Decreased safety awareness, Difficulty walking, Improper body mechanics, Impaired vision/preception    Artist Pais, PTA, 12/17/2021, 4:00 PM

## 2021-12-22 ENCOUNTER — Ambulatory Visit: Payer: PPO

## 2021-12-22 DIAGNOSIS — R2681 Unsteadiness on feet: Secondary | ICD-10-CM | POA: Diagnosis not present

## 2021-12-22 DIAGNOSIS — M6281 Muscle weakness (generalized): Secondary | ICD-10-CM

## 2021-12-22 DIAGNOSIS — R262 Difficulty in walking, not elsewhere classified: Secondary | ICD-10-CM

## 2021-12-22 NOTE — Therapy (Signed)
OUTPATIENT PHYSICAL THERAPY TREATMENT NOTE   Patient Name: Charles Hale MRN: [id number] DOB:1941-07-08, 81 y.o., male Today's Date: 12/22/2021  PCP: Emelia Loron  REFERRING PROVIDER: Emelia Loron   PT End of Session - 12/22/21 1531     Visit Number 30    Number of Visits 40    Date for PT Re-Evaluation 01/21/22    Authorization Type [employer]    Progress Note Due on Visit 30    PT Start Time 1440    PT Stop Time 1527    PT Time Calculation (min) 47 min    Activity Tolerance Patient tolerated treatment well    Behavior During Therapy [facility] for tasks assessed/performed                 Past Medical History:  Diagnosis Date   Anxiety    Arthritis    Hypertension    Spinal stenosis    Past Surgical History:  Procedure Laterality Date   SHOULDER ARTHROSCOPY     Patient Active Problem List   Diagnosis Date Noted   Cerebral thrombosis with cerebral infarction 06/17/2021   Essential hypertension 06/16/2021   Depression 06/16/2021   Osteoarthritis 06/16/2021   Insomnia 06/16/2021   Acute left-sided weakness 06/15/2021    REFERRING DIAG: I63.329 (ICD-10-CM) - Cerebral infarction due to thrombosis of unspecified  THERAPY DIAG:  Unsteadiness on feet  Difficulty in walking, not elsewhere classified  Muscle weakness (generalized)  PERTINENT HISTORY: anterior cerebral artery  CVA on 08/14/21, history chronic LBP, L ankle fracture/sprain, R knee OA/pain  PRECAUTIONS: fall  SUBJECTIVE: Pt reports more fatigue today. Wants to check his grip strength today. R knee is bothering more today. PAIN:  Are you having pain? No : NPRS scale: 0/10 Pain location: N/A   OBJECTIVE: (objective measures completed at initial evaluation unless otherwise dated) FOTO CVA: 53%.  Predicted outcome 62% after 17 visits.  65% 10/01/21         Posture/Postural Control    Posture/Postural Control Postural limitations     Postural Limitations Rounded Shoulders;Forward  head;Decreased lumbar lordosis;Weight shift right          ROM / Strength    AROM / PROM / Strength Strength          Strength    Overall Strength Deficits     Overall Strength Comments tested in sitting     Strength Assessment Site Hip;Knee;Ankle     Right/Left Hip Right;Left     Right Hip Flexion 5/5     Right Hip ABduction 5/5     Right Hip ADduction 5/5     Left Hip Flexion 4+/5     Left Hip ABduction 5/5     Left Hip ADduction 5/5     Right/Left Knee Right;Left     Right Knee Flexion 5/5     Right Knee Extension 5/5     Left Knee Flexion 4+/5     Left Knee Extension 5/5     Right/Left Ankle Right;Left     Right Ankle Dorsiflexion 5/5     Right Ankle Plantar Flexion 5/5     Right Ankle Inversion 5/5     Right Ankle Eversion 5/5     Left Ankle Dorsiflexion 4/5     Left Ankle Plantar Flexion 4/5     Left Ankle Inversion 4/5     Left Ankle Eversion 1/5  09/29/21- 4+/5        Ambulation/Gait    Ambulation/Gait Yes  Ambulation/Gait Assistance 5: Supervision     Ambulation/Gait Assistance Details SBA, unsteady, gait deviaitons     Ambulation Distance (Feet) 50 Feet     Assistive device Straight cane     Gait Pattern Step-through pattern;Decreased weight shift to left;Lateral trunk lean to right;Wide base of support          Balance    Balance Assessed Yes          Static Standing Balance    Static Standing - Balance Support No upper extremity supported     Static Standing - Level of Assistance 5: Stand by assistance     Static Standing Balance -  Activities  Single Leg Stance - Right Leg;Single Leg Stance - Left Leg;Tandam Stance - Right Leg;Tandam Stance - Left Leg;Romberg - Eyes Opened     Static Standing - Comment/# of Minutes able to stand 30 seconds without assist, unable to get into tandem stance, SLS x 3 sec R, O sec L          Standardized Balance Assessment    Standardized Balance Assessment Timed Up and Go Test;Five Times Sit to Stand     Five times sit to  stand comments  UE assist 13.5 sec          Timed Up and Go Test    TUG Normal TUG     Normal TUG (seconds) 20   with SPC       TODAY'S TREATMENT:  12/22/21 Therapeutic Exercise: Nustep L7x29min Seated pallof press 10x R/L blue TB Seated trunk rotation 10x R/L blue TB  Seated ball tosses in different directions for core stabilization Standing staggered SLS R/L 4 trials for ~ 10 sec each- LOB many times  Gait Training: Stairs 13 steps, 7' step 2 trials with only handrail to assist Gait speed taken - 2.5 m/s  12/17/21 Therapeutic Exercise: Treadmill x 10 min 1.0 mph increased speed by 0.2 each min for 3 min then consistent speed Fwd step and spinal extension 10x Monster walks fwd and back 2x81ft Standing lat pull with GTB 2x10  Standing row with GTB x 10 Multifidus walkout with GTB double band x 6  12/15/21 Therapeutic Exercise: to improve strength and mobility.  Demo, verbal  throughout and SBA/CGA for safety. Treadmill 2 x 8 min 1.0 mph SBA for safety, cues for heel strike, to improve endurance Hip throws with arms overhead with weighted blue ball 3 x 10  Overhead strikes with bar 2 x 10 hitting ball and step through, followed by 2 x 10 stopping just before hitting ball, CGA for safety        PATIENT EDUCATION: Education details: education on ways to help with mood, make sure eat and get out of house, activities, given information on [place] Person educated: Patient Education method: Theatre stage manager Education comprehension: verbalized understanding   HOME EXERCISE PROGRAM: [id number]  ASSESSMENT: Clinical Impression:  Pt was more fatigued and showed more balance deficits during gait. He also had reports of heaviness in the L leg so we were unable to fully assess gait w/o AD safely. Did stairs for the first time w/o cane, pt once again was limited by heaviness in L LE and balance today so he used step to pattern for safety. Gait speed was measured  2.5 m/s today. He showed good demonstration of the exercises today but more challenged with (SLS) staggered stance. Pt is making progress toward remaining goals and would continue to benefit from  PT to improve function.    PT Short Term Goals - 09/29/21 1455       PT SHORT TERM GOAL #1   Title Ind. with initial HEP    Baseline has HEP from HHPT    Time 2    Period Weeks    Status Achieved    Target Date 09/11/21      PT SHORT TERM GOAL #2   Title Pt. will demonstrate improved midline orientation with no weight shift to R with standing, transfers, or walking.    Baseline weight shifts and leans to R    Time 6    Period Weeks    Status Achieved    Target Date 10/09/21      PT SHORT TERM GOAL #3   Title Pt. will demonstrate improved L ankle eversion strength to 3/5 for safety in gait    Baseline 1/5 L ankle, rolls, using brace    Time 6    Period Weeks    Status Achieved   09/29/21- 4+/5 L ankle everson.   Target Date 10/09/21              PT Long Term Goals - 11/24/21 1415       PT LONG TERM GOAL #1   Title Pt. will be independent with progressed HEP to improve outcomes.    Time 12    Period Weeks    Status Achieved   09/29/21- met for current.   Target Date 11/20/21      PT LONG TERM GOAL #2   Title Pt. will be able to ambulate 1000' safely without AD for safety with community ambulation.    Time 12    Period Weeks    Status On-going   11/24/21- limited to 370 ft today   Target Date 01/21/22      PT LONG TERM GOAL #3   Title Pt. will be able to ascend/descend 12 stairs safely with 1 HR to access home.    Time 12    Period Weeks    Status On-going   09/22/21- can go up at home, but still difficulty descending  11/12/21- step to gait and HR on stairs, fatigued quickly   Target Date 01/21/22      PT LONG TERM GOAL #4   Title Pt. will be able to maintain tandem stance x 30 sec bil to demonstrate improved balance.    Baseline unable to maintain tandem stance    Time  12    Period Weeks    Status On-going   09/29/21- semi tandem 35 sec R, 19 sec L. Unable to maintain tandem stance.  10/21/21- 15 sec R foot forward, 5 sec L foot foward tandem  11/12/21- 10 sec bil   Target Date 01/21/22      PT LONG TERM GOAL #5   Title Pt. will score at least 62% on FOTO to demonstrate improved functional mobility    Baseline 53% (47% disability)    Time 12    Period Weeks    Status Achieved   65%   Target Date 11/20/21      PT LONG TERM GOAL #6   Title Pt. will improve gait speed to >0.8 m/s for community ambulation.    Baseline 0.6 m/s    Time 10    Period Weeks    Status Ongoing   Target Date 01/21/22            PLAN:    PT Frequency 2x /  week   PT Duration 12 weeks   PT Treatment/Interventions ADLs/Self Care Home Management;Moist Heat;Cryotherapy;Gait training;Stair training;Functional mobility training;Therapeutic activities;Therapeutic exercise;Balance training;Neuromuscular re-education;Patient/family education;Manual techniques   PT Next Visit Plan continue to progress LLE/LUE strength, balance and gait                   Patient will benefit from skilled therapeutic intervention in order to improve the following deficits and impairments:  Abnormal gait, Decreased endurance, Decreased strength, Decreased balance, Decreased mobility, Decreased safety awareness, Difficulty walking, Improper body mechanics, Impaired vision/preception    Artist Pais, PTA 12/22/2021, 3:32 PM

## 2021-12-24 ENCOUNTER — Ambulatory Visit: Payer: PPO | Admitting: Physical Therapy

## 2021-12-24 ENCOUNTER — Encounter: Payer: Self-pay | Admitting: Physical Therapy

## 2021-12-24 DIAGNOSIS — R262 Difficulty in walking, not elsewhere classified: Secondary | ICD-10-CM

## 2021-12-24 DIAGNOSIS — M6281 Muscle weakness (generalized): Secondary | ICD-10-CM

## 2021-12-24 DIAGNOSIS — R2681 Unsteadiness on feet: Secondary | ICD-10-CM | POA: Diagnosis not present

## 2021-12-24 NOTE — Therapy (Signed)
OUTPATIENT PHYSICAL THERAPY TREATMENT NOTE  Progress Note Reporting Period 11/12/2021 to 12/24/2021  See note below for Objective Data and Assessment of Progress/Goals.      Patient Name: Charles Hale MRN: [id number] DOB:13-Aug-1940, 81 y.o., male Today's Date: 12/24/2021  PCP: Emelia Loron  REFERRING PROVIDER: Emelia Loron   PT End of Session - 12/24/21 1447     Visit Number 31    Number of Visits 40    Date for PT Re-Evaluation 01/21/22    Authorization Type [employer]    Progress Note Due on Visit 35    PT Start Time 1447    PT Stop Time 1534    PT Time Calculation (min) 47 min    Activity Tolerance Patient tolerated treatment well    Behavior During Therapy [facility] for tasks assessed/performed                 Past Medical History:  Diagnosis Date   Anxiety    Arthritis    Hypertension    Spinal stenosis    Past Surgical History:  Procedure Laterality Date   SHOULDER ARTHROSCOPY     Patient Active Problem List   Diagnosis Date Noted   Cerebral thrombosis with cerebral infarction 06/17/2021   Essential hypertension 06/16/2021   Depression 06/16/2021   Osteoarthritis 06/16/2021   Insomnia 06/16/2021   Acute left-sided weakness 06/15/2021    REFERRING DIAG: I63.329 (ICD-10-CM) - Cerebral infarction due to thrombosis of unspecified  THERAPY DIAG:  Unsteadiness on feet  Difficulty in walking, not elsewhere classified  Muscle weakness (generalized)  PERTINENT HISTORY: anterior cerebral artery  CVA on 08/14/21, history chronic LBP, L ankle fracture/sprain, R knee OA/pain  PRECAUTIONS: fall  SUBJECTIVE: Pt reports more fatigue today. Wants to check his grip strength today. R knee is bothering more today. PAIN:  Are you having pain? Yes : NPRS scale: 2-3/10 Pain location: back, numbness in feet   OBJECTIVE: (objective measures completed at initial evaluation unless otherwise dated) FOTO CVA: 53%.  Predicted outcome 62% after 17  visits.  65% 10/01/21 12/24/21        Posture/Postural Control    Posture/Postural Control Postural limitations     Postural Limitations Rounded Shoulders;Forward head;Decreased lumbar lordosis;Weight shift right          ROM / Strength    AROM / PROM / Strength Strength          Strength    Overall Strength Deficits     Overall Strength Comments tested in sitting     Strength Assessment Site Hip;Knee;Ankle     Right/Left Hip Right;Left     Right Hip Flexion 5/5     Right Hip ABduction 5/5     Right Hip ADduction 5/5     Left Hip Flexion 4+/5  5/5   Left Hip ABduction 5/5     Left Hip ADduction 5/5     Right/Left Knee Right;Left     Right Knee Flexion 5/5     Right Knee Extension 5/5     Left Knee Flexion 4+/5  5/5   Left Knee Extension 5/5     Right/Left Ankle Right;Left     Right Ankle Dorsiflexion 5/5     Right Ankle Plantar Flexion 5/5     Right Ankle Inversion 5/5     Right Ankle Eversion 5/5     Left Ankle Dorsiflexion 4/5  12/24/21- 5/5   Left Ankle Plantar Flexion 4/5  12/24/21- 5/5   Left  Ankle Inversion 4/5  12/24/21- 5/5   Left Ankle Eversion 1/5  12/24/21- 3/5        Ambulation/Gait    Ambulation/Gait Yes     Ambulation/Gait Assistance 5: Supervision     Ambulation/Gait Assistance Details SBA, unsteady, gait deviaitons     Ambulation Distance (Feet) 50 Feet     Assistive device Straight cane     Gait Pattern Step-through pattern;Decreased weight shift to left;Lateral trunk lean to right;Wide base of support          Balance    Balance Assessed Yes          Static Standing Balance    Static Standing - Balance Support No upper extremity supported     Static Standing - Level of Assistance 5: Stand by assistance     Static Standing Balance -  Activities  Single Leg Stance - Right Leg;Single Leg Stance - Left Leg;Tandam Stance - Right Leg;Tandam Stance - Left Leg;Romberg - Eyes Opened     Static Standing - Comment/# of Minutes able to stand 30 seconds without assist,  unable to get into tandem stance, SLS x 3 sec R, O sec L  SLS x 5 sec R, 3 sec L Tandem 7 sec bil        Standardized Balance Assessment    Standardized Balance Assessment Timed Up and Go Test;Five Times Sit to Stand     Five times sit to stand comments  UE assist 13.5 sec  UE assist 8.5 seconds        Timed Up and Go Test    TUG Normal TUG     Normal TUG (seconds) 20   with SPC With SPC 14 sec, 11 sec, 11 sec Without AD 16 sec, 12 sec, 11 sec      TODAY'S TREATMENT:  12/24/21 Physical Performance to assess progress towards goals.  MMT LE and UE (no deficits in UE, Hand strength 55 lbs R hand, 65 lbs L hand); TUG, 5x STS, SLS, tandem stance.   Therapeutic Exercise: to improve strength and mobility.  Demo, verbal and tactile cues throughout for technique. Nustep L7 x 10 min   12/22/21 Therapeutic Exercise: Nustep L7x39mn Seated pallof press 10x R/L blue TB Seated trunk rotation 10x R/L blue TB  Seated ball tosses in different directions for core stabilization Standing staggered SLS R/L 4 trials for ~ 10 sec each- LOB many times  Gait Training: Stairs 13 steps, 7' step 2 trials with only handrail to assist Gait speed taken - 2.5 m/s  12/17/21 Therapeutic Exercise: Treadmill x 10 min 1.0 mph increased speed by 0.2 each min for 3 min then consistent speed Fwd step and spinal extension 10x Monster walks fwd and back 2x120fStanding lat pull with GTB 2x10  Standing row with GTB x 10 Multifidus walkout with GTB double band x 6  PATIENT EDUCATION: Education details: education on ways to help with mood, make sure eat and get out of house, activities, [place] educated: Patient Education method: ExTheatre stage managerducation comprehension: verbalized understanding   HOME EXERCISE PROGRAM: [id number]  ASSESSMENT: Clinical Impression:  Pt is making good progress towards goals, but reports more and more limitation from LBP causing  increased pain with any prolonged sitting and numbness in both feet.  LBP also appears to be limiting progress towards gait goal, which is now the primary concern, as his strength has improved significantly, he did not demonstrate any UE  deficits but continues to be very unsteady and slow especially without AD.  Recommended he contact his PCP about referral for PT for LBP and also for spine specialist in case further interventions are necessary to better control pain and allow progress.  His current POC is appropriate and to continue working on improving balance and gait.      PT Short Term Goals - 09/29/21 1455       PT SHORT TERM GOAL #1   Title Ind. with initial HEP    Baseline has HEP from HHPT    Time 2    Period Weeks    Status Achieved    Target Date 09/11/21      PT SHORT TERM GOAL #2   Title Pt. will demonstrate improved midline orientation with no weight shift to R with standing, transfers, or walking.    Baseline weight shifts and leans to R    Time 6    Period Weeks    Status Achieved    Target Date 10/09/21      PT SHORT TERM GOAL #3   Title Pt. will demonstrate improved L ankle eversion strength to 3/5 for safety in gait    Baseline 1/5 L ankle, rolls, using brace    Time 6    Period Weeks    Status Achieved   09/29/21- 4+/5 L ankle everson.   Target Date 10/09/21              PT Long Term Goals - 11/24/21 1415       PT LONG TERM GOAL #1   Title Pt. will be independent with progressed HEP to improve outcomes.    Time 12    Period Weeks    Status Achieved   09/29/21- met for current.   Target Date 11/20/21      PT LONG TERM GOAL #2   Title Pt. will be able to ambulate 1000' safely without AD for safety with community ambulation.    Time 12    Period Weeks    Status On-going   11/24/21- limited to 370 ft, 12/24/21 - still requires [facility] for safety.   Target Date 01/21/22      PT LONG TERM GOAL #3   Title Pt. will be able to ascend/descend 12 stairs safely  with 1 HR to access home.    Time 12    Period Weeks    Status Achieved   09/22/21- can go up at home, but still difficulty descending  11/12/21- step to gait and HR on stairs, fatigued quickly 12/24/21- can ascend/descend stairs at home now without difficulty.    Target Date 01/21/22      PT LONG TERM GOAL #4   Title Pt. will be able to maintain tandem stance x 30 sec bil to demonstrate improved balance.    Baseline unable to maintain tandem stance    Time 12    Period Weeks    Status On-going   09/29/21- semi tandem 35 sec R, 19 sec L. Unable to maintain tandem stance.  10/21/21- 15 sec R foot forward, 5 sec L foot foward tandem  11/12/21- 10 sec bil 12/24/21- unsteady today, only 7 seconds.    Target Date 01/21/22      PT LONG TERM GOAL #5   Title Pt. will score at least 62% on FOTO to demonstrate improved functional mobility    Baseline 53% (47% disability)    Time 12    Period Weeks  Status Achieved   65%   Target Date 11/20/21      PT LONG TERM GOAL #6   Title Pt. will improve gait speed to >0.8 m/s for community ambulation.    Baseline 0.6 m/s    Time 10    Period Weeks    Status Ongoing- 12/24/21- no significant change.    Target Date 01/21/22            PLAN:    PT Frequency 2x / week   PT Duration 12 weeks   PT Treatment/Interventions ADLs/Self Care Home Management;Moist Heat;Cryotherapy;Gait training;Stair training;Functional mobility training;Therapeutic activities;Therapeutic exercise;Balance training;Neuromuscular re-education;Patient/family education;Manual techniques   PT Next Visit Plan continue to progress LLE/LUE strength, balance and gait                   Patient will benefit from skilled therapeutic intervention in order to improve the following deficits and impairments:  Abnormal gait, Decreased endurance, Decreased strength, Decreased balance, Decreased mobility, Decreased safety awareness, Difficulty walking, Improper body mechanics, Impaired  vision/preception    Rennie Natter, PT, DPT  12/24/2021, 5:56 PM

## 2021-12-29 ENCOUNTER — Ambulatory Visit: Payer: PPO

## 2021-12-29 DIAGNOSIS — R262 Difficulty in walking, not elsewhere classified: Secondary | ICD-10-CM

## 2021-12-29 DIAGNOSIS — R2681 Unsteadiness on feet: Secondary | ICD-10-CM

## 2021-12-29 DIAGNOSIS — M6281 Muscle weakness (generalized): Secondary | ICD-10-CM

## 2021-12-29 NOTE — Therapy (Signed)
OUTPATIENT PHYSICAL THERAPY TREATMENT NOTE       Patient Name: Charles Hale MRN: [id number] DOB:[birth year], 81 y.o., male Today's Date: 12/29/2021  PCP: Emelia Loron  REFERRING PROVIDER: Emelia Loron   PT End of Session - 12/29/21 1534     Visit Number 32    Number of Visits 40    Date for PT Re-Evaluation 01/21/22    Authorization Type [employer]    Progress Note Due on Visit 88    PT Start Time 1447    PT Stop Time 1528    PT Time Calculation (min) 41 min    Activity Tolerance Patient tolerated treatment well    Behavior During Therapy [facility] for tasks assessed/performed                  Past Medical History:  Diagnosis Date   Anxiety    Arthritis    Hypertension    Spinal stenosis    Past Surgical History:  Procedure Laterality Date   SHOULDER ARTHROSCOPY     Patient Active Problem List   Diagnosis Date Noted   Cerebral thrombosis with cerebral infarction 06/17/2021   Essential hypertension 06/16/2021   Depression 06/16/2021   Osteoarthritis 06/16/2021   Insomnia 06/16/2021   Acute left-sided weakness 06/15/2021    REFERRING DIAG: I63.329 (ICD-10-CM) - Cerebral infarction due to thrombosis of unspecified  THERAPY DIAG:  Unsteadiness on feet  Difficulty in walking, not elsewhere classified  Muscle weakness (generalized)  PERTINENT HISTORY: anterior cerebral artery  CVA on 08/14/21, history chronic LBP, L ankle fracture/sprain, R knee OA/pain  PRECAUTIONS: fall  SUBJECTIVE:  PAIN:  Are you having pain? No : NPRS scale: None Pain location: back, numbness in feet   OBJECTIVE: (objective measures completed at initial evaluation unless otherwise dated) FOTO CVA: 53%.  Predicted outcome 62% after 17 visits.  65% 10/01/21 12/24/21        Posture/Postural Control    Posture/Postural Control Postural limitations     Postural Limitations Rounded Shoulders;Forward head;Decreased lumbar lordosis;Weight shift right          ROM /  Strength    AROM / PROM / Strength Strength          Strength    Overall Strength Deficits     Overall Strength Comments tested in sitting     Strength Assessment Site Hip;Knee;Ankle     Right/Left Hip Right;Left     Right Hip Flexion 5/5     Right Hip ABduction 5/5     Right Hip ADduction 5/5     Left Hip Flexion 4+/5  5/5   Left Hip ABduction 5/5     Left Hip ADduction 5/5     Right/Left Knee Right;Left     Right Knee Flexion 5/5     Right Knee Extension 5/5     Left Knee Flexion 4+/5  5/5   Left Knee Extension 5/5     Right/Left Ankle Right;Left     Right Ankle Dorsiflexion 5/5     Right Ankle Plantar Flexion 5/5     Right Ankle Inversion 5/5     Right Ankle Eversion 5/5     Left Ankle Dorsiflexion 4/5  12/24/21- 5/5   Left Ankle Plantar Flexion 4/5  12/24/21- 5/5   Left Ankle Inversion 4/5  12/24/21- 5/5   Left Ankle Eversion 1/5  12/24/21- 3/5        Ambulation/Gait    Ambulation/Gait Yes     Ambulation/Gait Assistance  5: Supervision     Ambulation/Gait Assistance Details SBA, unsteady, gait deviaitons     Ambulation Distance (Feet) 50 Feet     Assistive device Straight cane     Gait Pattern Step-through pattern;Decreased weight shift to left;Lateral trunk lean to right;Wide base of support          Balance    Balance Assessed Yes          Static Standing Balance    Static Standing - Balance Support No upper extremity supported     Static Standing - Level of Assistance 5: Stand by assistance     Static Standing Balance -  Activities  Single Leg Stance - Right Leg;Single Leg Stance - Left Leg;Tandam Stance - Right Leg;Tandam Stance - Left Leg;Romberg - Eyes Opened     Static Standing - Comment/# of Minutes able to stand 30 seconds without assist, unable to get into tandem stance, SLS x 3 sec R, O sec L  SLS x 5 sec R, 3 sec L Tandem 7 sec bil        Standardized Balance Assessment    Standardized Balance Assessment Timed Up and Go Test;Five Times Sit to Stand     Five  times sit to stand comments  UE assist 13.5 sec  UE assist 8.5 seconds        Timed Up and Go Test    TUG Normal TUG     Normal TUG (seconds) 20   with SPC With SPC 14 sec, 11 sec, 11 sec Without AD 16 sec, 12 sec, 11 sec      TODAY'S TREATMENT:  12/29/21 Therapeutic Exercise: Nustep L8x48mn  Neuro re-ed: R/L SLS with opposite leg touch to dots; 5 trials each 5 dots no UE support, 2nd trial 3 dots w/o visual input Standing DLS narrow BOS EC 2 trials 10 sec hold; 3 rd trial 30 sec hold Sidesteps with EC along counter 3x down/back  CGA with all exercises required for safety   12/24/21 Physical Performance to assess progress towards goals.  MMT LE and UE (no deficits in UE, Hand strength 55 lbs R hand, 65 lbs L hand); TUG, 5x STS, SLS, tandem stance.   Therapeutic Exercise: to improve strength and mobility.  Demo, verbal and tactile cues throughout for technique. Nustep L7 x 10 min   12/22/21 Therapeutic Exercise: Nustep L7x663m Seated pallof press 10x R/L blue TB Seated trunk rotation 10x R/L blue TB  Seated ball tosses in different directions for core stabilization Standing staggered SLS R/L 4 trials for ~ 10 sec each- LOB many times  Gait Training: Stairs 13 steps, 7' step 2 trials with only handrail to assist Gait speed taken - 2.5 m/s    PATIENT EDUCATION: Education details: education on ways to help with mood, make sure eat and get out of house, activities, [place] educated: Patient Education method: ExTheatre stage managerducation comprehension: verbalized understanding   HOME EXERCISE PROGRAM: [id number]  ASSESSMENT: Clinical Impression:  Progressed balance and proprioceptive exercises today to improve kinesthetic awareness for balance. Pt shows difficulty with adding movement and taking away visual feedback with staggered stance. He would be just fine with continuing to progress balance and strengthening next visit.      PT Short Term Goals - 09/29/21 1455       PT SHORT TERM GOAL #1   Title Ind. with initial HEP    Baseline has HEP from HHPT  Time 2    Period Weeks    Status Achieved    Target Date 09/11/21      PT SHORT TERM GOAL #2   Title Pt. will demonstrate improved midline orientation with no weight shift to R with standing, transfers, or walking.    Baseline weight shifts and leans to R    Time 6    Period Weeks    Status Achieved    Target Date 10/09/21      PT SHORT TERM GOAL #3   Title Pt. will demonstrate improved L ankle eversion strength to 3/5 for safety in gait    Baseline 1/5 L ankle, rolls, using brace    Time 6    Period Weeks    Status Achieved   09/29/21- 4+/5 L ankle everson.   Target Date 10/09/21              PT Long Term Goals - 11/24/21 1415       PT LONG TERM GOAL #1   Title Pt. will be independent with progressed HEP to improve outcomes.    Time 12    Period Weeks    Status Achieved   09/29/21- met for current.   Target Date 11/20/21      PT LONG TERM GOAL #2   Title Pt. will be able to ambulate 1000' safely without AD for safety with community ambulation.    Time 12    Period Weeks    Status On-going   11/24/21- limited to 370 ft, 12/24/21 - still requires [facility] for safety.   Target Date 01/21/22      PT LONG TERM GOAL #3   Title Pt. will be able to ascend/descend 12 stairs safely with 1 HR to access home.    Time 12    Period Weeks    Status Achieved   09/22/21- can go up at home, but still difficulty descending  11/12/21- step to gait and HR on stairs, fatigued quickly 12/24/21- can ascend/descend stairs at home now without difficulty.    Target Date 01/21/22      PT LONG TERM GOAL #4   Title Pt. will be able to maintain tandem stance x 30 sec bil to demonstrate improved balance.    Baseline unable to maintain tandem stance    Time 12    Period Weeks    Status On-going   09/29/21- semi tandem 35 sec R, 19 sec L. Unable to maintain tandem stance.   10/21/21- 15 sec R foot forward, 5 sec L foot foward tandem  11/12/21- 10 sec bil 12/24/21- unsteady today, only 7 seconds.    Target Date 01/21/22      PT LONG TERM GOAL #5   Title Pt. will score at least 62% on FOTO to demonstrate improved functional mobility    Baseline 53% (47% disability)    Time 12    Period Weeks    Status Achieved   65%   Target Date 11/20/21      PT LONG TERM GOAL #6   Title Pt. will improve gait speed to >0.8 m/s for community ambulation.    Baseline 0.6 m/s    Time 10    Period Weeks    Status Ongoing- 12/24/21- no significant change.    Target Date 01/21/22            PLAN:    PT Frequency 2x / week   PT Duration 12 weeks   PT Treatment/Interventions ADLs/Self Care Home  Management;Moist Heat;Cryotherapy;Gait training;Stair training;Functional mobility training;Therapeutic activities;Therapeutic exercise;Balance training;Neuromuscular re-education;Patient/family education;Manual techniques   PT Next Visit Plan continue to progress LLE/LUE strength, balance and gait                   Patient will benefit from skilled therapeutic intervention in order to improve the following deficits and impairments:  Abnormal gait, Decreased endurance, Decreased strength, Decreased balance, Decreased mobility, Decreased safety awareness, Difficulty walking, Improper body mechanics, Impaired vision/preception    Artist Pais, PTA 12/29/2021, 4:29 PM

## 2021-12-31 ENCOUNTER — Encounter: Payer: Self-pay | Admitting: Physical Therapy

## 2021-12-31 ENCOUNTER — Ambulatory Visit: Payer: PPO | Admitting: Physical Therapy

## 2021-12-31 DIAGNOSIS — R2681 Unsteadiness on feet: Secondary | ICD-10-CM

## 2021-12-31 DIAGNOSIS — M6281 Muscle weakness (generalized): Secondary | ICD-10-CM

## 2021-12-31 DIAGNOSIS — R262 Difficulty in walking, not elsewhere classified: Secondary | ICD-10-CM

## 2021-12-31 NOTE — Therapy (Signed)
OUTPATIENT PHYSICAL THERAPY TREATMENT NOTE       Patient Name: Charles Hale MRN: [id number] DOB:[birth year], 81 y.o., male Today's Date: 12/31/2021  PCP: Emelia Loron  REFERRING PROVIDER: Emelia Loron   PT End of Session - 12/31/21 1453     Visit Number 33    Number of Visits 40    Date for PT Re-Evaluation 01/21/22    Authorization Type [employer]    Progress Note Due on Visit 57    PT Start Time 1447    PT Stop Time 1530    PT Time Calculation (min) 43 min    Activity Tolerance Patient tolerated treatment well    Behavior During Therapy [facility] for tasks assessed/performed                  Past Medical History:  Diagnosis Date   Anxiety    Arthritis    Hypertension    Spinal stenosis    Past Surgical History:  Procedure Laterality Date   SHOULDER ARTHROSCOPY     Patient Active Problem List   Diagnosis Date Noted   Cerebral thrombosis with cerebral infarction 06/17/2021   Essential hypertension 06/16/2021   Depression 06/16/2021   Osteoarthritis 06/16/2021   Insomnia 06/16/2021   Acute left-sided weakness 06/15/2021    REFERRING DIAG: I63.329 (ICD-10-CM) - Cerebral infarction due to thrombosis of unspecified  THERAPY DIAG:  Unsteadiness on feet  Difficulty in walking, not elsewhere classified  Muscle weakness (generalized)  PERTINENT HISTORY: anterior cerebral artery  CVA on 08/14/21, history chronic LBP, L ankle fracture/sprain, R knee OA/pain  PRECAUTIONS: fall  SUBJECTIVE:  Pt. Reports he is taking Aleve to control LBP.  He had [place] of legs, told had good blood flow and not clots.    PAIN:  Are you having pain? No : NPRS scale: None Pain location: back, numbness in feet   OBJECTIVE: (objective measures completed at initial evaluation unless otherwise dated) FOTO CVA: 53%.  Predicted outcome 62% after 17 visits.  65% 10/01/21 12/24/21        Posture/Postural Control    Posture/Postural Control Postural limitations      Postural Limitations Rounded Shoulders;Forward head;Decreased lumbar lordosis;Weight shift right          ROM / Strength    AROM / PROM / Strength Strength          Strength    Overall Strength Deficits     Overall Strength Comments tested in sitting     Strength Assessment Site Hip;Knee;Ankle     Right/Left Hip Right;Left     Right Hip Flexion 5/5     Right Hip ABduction 5/5     Right Hip ADduction 5/5     Left Hip Flexion 4+/5  5/5   Left Hip ABduction 5/5     Left Hip ADduction 5/5     Right/Left Knee Right;Left     Right Knee Flexion 5/5     Right Knee Extension 5/5     Left Knee Flexion 4+/5  5/5   Left Knee Extension 5/5     Right/Left Ankle Right;Left     Right Ankle Dorsiflexion 5/5     Right Ankle Plantar Flexion 5/5     Right Ankle Inversion 5/5     Right Ankle Eversion 5/5     Left Ankle Dorsiflexion 4/5  12/24/21- 5/5   Left Ankle Plantar Flexion 4/5  12/24/21- 5/5   Left Ankle Inversion 4/5  12/24/21- 5/5  Left Ankle Eversion 1/5  12/24/21- 3/5        Ambulation/Gait    Ambulation/Gait Yes     Ambulation/Gait Assistance 5: Supervision     Ambulation/Gait Assistance Details SBA, unsteady, gait deviaitons     Ambulation Distance (Feet) 50 Feet     Assistive device Straight cane     Gait Pattern Step-through pattern;Decreased weight shift to left;Lateral trunk lean to right;Wide base of support          Balance    Balance Assessed Yes          Static Standing Balance    Static Standing - Balance Support No upper extremity supported     Static Standing - Level of Assistance 5: Stand by assistance     Static Standing Balance -  Activities  Single Leg Stance - Right Leg;Single Leg Stance - Left Leg;Tandam Stance - Right Leg;Tandam Stance - Left Leg;Romberg - Eyes Opened     Static Standing - Comment/# of Minutes able to stand 30 seconds without assist, unable to get into tandem stance, SLS x 3 sec R, O sec L  SLS x 5 sec R, 3 sec L Tandem 7 sec bil         Standardized Balance Assessment    Standardized Balance Assessment Timed Up and Go Test;Five Times Sit to Stand     Five times sit to stand comments  UE assist 13.5 sec  UE assist 8.5 seconds        Timed Up and Go Test    TUG Normal TUG     Normal TUG (seconds) 20   with SPC With SPC 14 sec, 11 sec, 11 sec Without AD 16 sec, 12 sec, 11 sec      TODAY'S TREATMENT:  12/31/2021 Therapeutic Exercise: to improve strength and mobility.   Bike L8 x 10 min  Standing high marches - 2# ankle weight on LLE 2 x 1 min, seated rest break between.  CGA for safety Prone leg raises 2 x 10 bil  Prone hamstring curls x 20 LLE - eccentric lowering Bridges x 10 with LLE closer to bias Therapeutic Activities : dynamic transfers - sit to stand and immediate step.  - starting with yoga block - very difficult, decreased to taking step without obstacle and without SPC, then progressed to small obstacle step over.  Cues to step with LLE and maintain cadence, CGA for safety.   12/29/21 Therapeutic Exercise: Nustep L8x69mn  Neuro re-ed: R/L SLS with opposite leg touch to dots; 5 trials each 5 dots no UE support, 2nd trial 3 dots w/o visual input Standing DLS narrow BOS EC 2 trials 10 sec hold; 3 rd trial 30 sec hold Sidesteps with EC along counter 3x down/back  CGA with all exercises required for safety   12/24/21 Physical Performance to assess progress towards goals.  MMT LE and UE (no deficits in UE, Hand strength 55 lbs R hand, 65 lbs L hand); TUG, 5x STS, SLS, tandem stance.   Therapeutic Exercise: to improve strength and mobility.  Demo, verbal and tactile cues throughout for technique. Nustep L7 x 10 min   12/22/21 Therapeutic Exercise: Nustep L7x647m Seated pallof press 10x R/L blue TB Seated trunk rotation 10x R/L blue TB  Seated ball tosses in different directions for core stabilization Standing staggered SLS R/L 4 trials for ~ 10 sec each- LOB many times  Gait Training: Stairs 13 steps, 7'  step 2 trials with only handrail to assist  Gait speed taken - 2.5 m/s    PATIENT EDUCATION: Education details: HEP update Person educated: Patient Education method: Explanation and Handouts Education comprehension: verbalized understanding   HOME EXERCISE PROGRAM: [id number]  ASSESSMENT: Clinical Impression:  Focus of today's skilled therapy working on cadance and rhythm and speed of hip flexion and stepping to improve gait speed.  Very challenged stepping over yoga block today with LLE.  Improved with lower obstacle.  Also reviewed and progressed exercises for glute strengthening to also help with core strengthening and back pain.  Discussed now back pain better controlled with Aleve resuming treadmill training next session.     PT Short Term Goals - 09/29/21 1455       PT SHORT TERM GOAL #1   Title Ind. with initial HEP    Baseline has HEP from HHPT    Time 2    Period Weeks    Status Achieved    Target Date 09/11/21      PT SHORT TERM GOAL #2   Title Pt. will demonstrate improved midline orientation with no weight shift to R with standing, transfers, or walking.    Baseline weight shifts and leans to R    Time 6    Period Weeks    Status Achieved    Target Date 10/09/21      PT SHORT TERM GOAL #3   Title Pt. will demonstrate improved L ankle eversion strength to 3/5 for safety in gait    Baseline 1/5 L ankle, rolls, using brace    Time 6    Period Weeks    Status Achieved   09/29/21- 4+/5 L ankle everson.   Target Date 10/09/21              PT Long Term Goals - 11/24/21 1415       PT LONG TERM GOAL #1   Title Pt. will be independent with progressed HEP to improve outcomes.    Time 12    Period Weeks    Status Achieved   09/29/21- met for current.   Target Date 11/20/21      PT LONG TERM GOAL #2   Title Pt. will be able to ambulate 1000' safely without AD for safety with community ambulation.    Time 12    Period Weeks    Status On-going   11/24/21-  limited to 370 ft, 12/24/21 - still requires [facility] for safety.   Target Date 01/21/22      PT LONG TERM GOAL #3   Title Pt. will be able to ascend/descend 12 stairs safely with 1 HR to access home.    Time 12    Period Weeks    Status Achieved   09/22/21- can go up at home, but still difficulty descending  11/12/21- step to gait and HR on stairs, fatigued quickly 12/24/21- can ascend/descend stairs at home now without difficulty.    Target Date 01/21/22      PT LONG TERM GOAL #4   Title Pt. will be able to maintain tandem stance x 30 sec bil to demonstrate improved balance.    Baseline unable to maintain tandem stance    Time 12    Period Weeks    Status On-going   09/29/21- semi tandem 35 sec R, 19 sec L. Unable to maintain tandem stance.  10/21/21- 15 sec R foot forward, 5 sec L foot foward tandem  11/12/21- 10 sec bil 12/24/21- unsteady today, only 7 seconds.    Target  Date 01/21/22      PT LONG TERM GOAL #5   Title Pt. will score at least 62% on FOTO to demonstrate improved functional mobility    Baseline 53% (47% disability)    Time 12    Period Weeks    Status Achieved   65%   Target Date 11/20/21      PT LONG TERM GOAL #6   Title Pt. will improve gait speed to >0.8 m/s for community ambulation.    Baseline 0.6 m/s    Time 10    Period Weeks    Status Ongoing- 12/24/21- no significant change.    Target Date 01/21/22            PLAN:    PT Frequency 2x / week   PT Duration 12 weeks   PT Treatment/Interventions ADLs/Self Care Home Management;Moist Heat;Cryotherapy;Gait training;Stair training;Functional mobility training;Therapeutic activities;Therapeutic exercise;Balance training;Neuromuscular re-education;Patient/family education;Manual techniques   PT Next Visit Plan continue to progress LLE/LUE strength, balance and gait                   Patient will benefit from skilled therapeutic intervention in order to improve the following deficits and impairments:  Abnormal gait,  Decreased endurance, Decreased strength, Decreased balance, Decreased mobility, Decreased safety awareness, Difficulty walking, Improper body mechanics, Impaired vision/preception    Rennie Natter, PT, DPT  12/31/2021, 3:47 PM

## 2022-01-01 ENCOUNTER — Other Ambulatory Visit (HOSPITAL_COMMUNITY): Payer: Self-pay

## 2022-01-05 ENCOUNTER — Ambulatory Visit: Payer: PPO

## 2022-01-05 DIAGNOSIS — R262 Difficulty in walking, not elsewhere classified: Secondary | ICD-10-CM

## 2022-01-05 DIAGNOSIS — R2681 Unsteadiness on feet: Secondary | ICD-10-CM

## 2022-01-05 DIAGNOSIS — M6281 Muscle weakness (generalized): Secondary | ICD-10-CM

## 2022-01-05 NOTE — Therapy (Addendum)
OUTPATIENT PHYSICAL THERAPY TREATMENT NOTE  PHYSICAL THERAPY DISCHARGE SUMMARY  Visits from Start of Care: 34  Current functional level related to goals / functional outcomes: Improved LLE and LUE strength   Remaining deficits: LLE weakness, impaired ambulation   Education / Equipment: HEP  Plan: Patient goals were not met. Patient called to cancel all visits due to fall and knee injury on 01/11/22.  HE was subsequently admitted and hospitalized for CVA. Marland Kitchen      Rennie Natter, PT, DPT 5:00 PM 02/04/2022    Patient Name: Charles Hale MRN: [id number] DOB:[birth year], 81 y.o., male Today's Date: 01/05/2022  PCP: Emelia Loron  REFERRING PROVIDER: Emelia Loron   PT End of Session - 01/05/22 1537     Visit Number 34    Number of Visits 50    Date for PT Re-Evaluation 01/21/22    Authorization Type [employer]    Progress Note Due on Visit 75    PT Start Time 1447    PT Stop Time 1529    PT Time Calculation (min) 42 min    Activity Tolerance Patient tolerated treatment well    Behavior During Therapy [facility] for tasks assessed/performed                   Past Medical History:  Diagnosis Date   Anxiety    Arthritis    Hypertension    Spinal stenosis    Past Surgical History:  Procedure Laterality Date   SHOULDER ARTHROSCOPY     Patient Active Problem List   Diagnosis Date Noted   Cerebral thrombosis with cerebral infarction 06/17/2021   Essential hypertension 06/16/2021   Depression 06/16/2021   Osteoarthritis 06/16/2021   Insomnia 06/16/2021   Acute left-sided weakness 06/15/2021    REFERRING DIAG: I63.329 (ICD-10-CM) - Cerebral infarction due to thrombosis of unspecified  THERAPY DIAG:  Unsteadiness on feet  Difficulty in walking, not elsewhere classified  Muscle weakness (generalized)  PERTINENT HISTORY: anterior cerebral artery  CVA on 08/14/21, history chronic LBP, L ankle fracture/sprain, R knee OA/pain  PRECAUTIONS:  fall  SUBJECTIVE:  Pt notes balance is still not what it should be.  PAIN:  Are you having pain? No : NPRS scale: None Pain location: back, numbness in feet   OBJECTIVE: (objective measures completed at initial evaluation unless otherwise dated) FOTO CVA: 53%.  Predicted outcome 62% after 17 visits.  65% 10/01/21 12/24/21        Posture/Postural Control    Posture/Postural Control Postural limitations     Postural Limitations Rounded Shoulders;Forward head;Decreased lumbar lordosis;Weight shift right          ROM / Strength    AROM / PROM / Strength Strength          Strength    Overall Strength Deficits     Overall Strength Comments tested in sitting     Strength Assessment Site Hip;Knee;Ankle     Right/Left Hip Right;Left     Right Hip Flexion 5/5     Right Hip ABduction 5/5     Right Hip ADduction 5/5     Left Hip Flexion 4+/5  5/5   Left Hip ABduction 5/5     Left Hip ADduction 5/5     Right/Left Knee Right;Left     Right Knee Flexion 5/5     Right Knee Extension 5/5     Left Knee Flexion 4+/5  5/5   Left Knee Extension 5/5  Right/Left Ankle Right;Left     Right Ankle Dorsiflexion 5/5     Right Ankle Plantar Flexion 5/5     Right Ankle Inversion 5/5     Right Ankle Eversion 5/5     Left Ankle Dorsiflexion 4/5  12/24/21- 5/5   Left Ankle Plantar Flexion 4/5  12/24/21- 5/5   Left Ankle Inversion 4/5  12/24/21- 5/5   Left Ankle Eversion 1/5  12/24/21- 3/5        Ambulation/Gait    Ambulation/Gait Yes     Ambulation/Gait Assistance 5: Supervision     Ambulation/Gait Assistance Details SBA, unsteady, gait deviaitons     Ambulation Distance (Feet) 50 Feet     Assistive device Straight cane     Gait Pattern Step-through pattern;Decreased weight shift to left;Lateral trunk lean to right;Wide base of support          Balance    Balance Assessed Yes          Static Standing Balance    Static Standing - Balance Support No upper extremity supported     Static Standing -  Level of Assistance 5: Stand by assistance     Static Standing Balance -  Activities  Single Leg Stance - Right Leg;Single Leg Stance - Left Leg;Tandam Stance - Right Leg;Tandam Stance - Left Leg;Romberg - Eyes Opened     Static Standing - Comment/# of Minutes able to stand 30 seconds without assist, unable to get into tandem stance, SLS x 3 sec R, O sec L  SLS x 5 sec R, 3 sec L Tandem 7 sec bil        Standardized Balance Assessment    Standardized Balance Assessment Timed Up and Go Test;Five Times Sit to Stand     Five times sit to stand comments  UE assist 13.5 sec  UE assist 8.5 seconds        Timed Up and Go Test    TUG Normal TUG     Normal TUG (seconds) 20   with SPC With SPC 14 sec, 11 sec, 11 sec Without AD 16 sec, 12 sec, 11 sec      TODAY'S TREATMENT:  01/05/22 Therapeutic Exercise: Nustep L8x63min  Neuromuscular Re-ed: SLS opp leg reach to dots standing on balance disk 5x each LE with SPC Modified tandem on balance beam 3x with SPC Sidestep on balance beam 4x Rockerboard PF/DF (20x) EV/IV (10x) standing   12/31/2021 Therapeutic Exercise: to improve strength and mobility.   Bike L8 x 10 min  Standing high marches - 2# ankle weight on LLE 2 x 1 min, seated rest break between.  CGA for safety Prone leg raises 2 x 10 bil  Prone hamstring curls x 20 LLE - eccentric lowering Bridges x 10 with LLE closer to bias Therapeutic Activities : dynamic transfers - sit to stand and immediate step.  - starting with yoga block - very difficult, decreased to taking step without obstacle and without SPC, then progressed to small obstacle step over.  Cues to step with LLE and maintain cadence, CGA for safety.   12/29/21 Therapeutic Exercise: Nustep L8x78min  Neuro re-ed: R/L SLS with opposite leg touch to dots; 5 trials each 5 dots no UE support, 2nd trial 3 dots w/o visual input Standing DLS narrow BOS EC 2 trials 10 sec hold; 3 rd trial 30 sec hold Sidesteps with EC along counter 3x  down/back  CGA with all exercises required for safety    PATIENT EDUCATION: Education  details: HEP update Person educated: Patient Education method: Explanation and Handouts Education comprehension: verbalized understanding   HOME EXERCISE PROGRAM: [id number]  ASSESSMENT: Clinical Impression:  Focused session on progressing balance training to improve proprioceptive feedback and facilitate proper muscle recruitment required to ensure safety with ADLs. Pt was challenged today with most interventions required CGA-min A for safety with interventions. He would continue to benefit from dynamic balance to improve function.   PT Short Term Goals - 09/29/21 1455       PT SHORT TERM GOAL #1   Title Ind. with initial HEP    Baseline has HEP from HHPT    Time 2    Period Weeks    Status Achieved    Target Date 09/11/21      PT SHORT TERM GOAL #2   Title Pt. will demonstrate improved midline orientation with no weight shift to R with standing, transfers, or walking.    Baseline weight shifts and leans to R    Time 6    Period Weeks    Status Achieved    Target Date 10/09/21      PT SHORT TERM GOAL #3   Title Pt. will demonstrate improved L ankle eversion strength to 3/5 for safety in gait    Baseline 1/5 L ankle, rolls, using brace    Time 6    Period Weeks    Status Achieved   09/29/21- 4+/5 L ankle everson.   Target Date 10/09/21              PT Long Term Goals - 11/24/21 1415       PT LONG TERM GOAL #1   Title Pt. will be independent with progressed HEP to improve outcomes.    Time 12    Period Weeks    Status Achieved   09/29/21- met for current.   Target Date 11/20/21      PT LONG TERM GOAL #2   Title Pt. will be able to ambulate 1000' safely without AD for safety with community ambulation.    Time 12    Period Weeks    Status On-going   11/24/21- limited to 370 ft, 12/24/21 - still requires [facility] for safety.   Target Date 01/21/22      PT LONG TERM GOAL #3    Title Pt. will be able to ascend/descend 12 stairs safely with 1 HR to access home.    Time 12    Period Weeks    Status Achieved   09/22/21- can go up at home, but still difficulty descending  11/12/21- step to gait and HR on stairs, fatigued quickly 12/24/21- can ascend/descend stairs at home now without difficulty.    Target Date 01/21/22      PT LONG TERM GOAL #4   Title Pt. will be able to maintain tandem stance x 30 sec bil to demonstrate improved balance.    Baseline unable to maintain tandem stance    Time 12    Period Weeks    Status On-going   09/29/21- semi tandem 35 sec R, 19 sec L. Unable to maintain tandem stance.  10/21/21- 15 sec R foot forward, 5 sec L foot foward tandem  11/12/21- 10 sec bil 12/24/21- unsteady today, only 7 seconds.    Target Date 01/21/22      PT LONG TERM GOAL #5   Title Pt. will score at least 62% on FOTO to demonstrate improved functional mobility    Baseline 53% (47% disability)  Time 12    Period Weeks    Status Achieved   65%   Target Date 11/20/21      PT LONG TERM GOAL #6   Title Pt. will improve gait speed to >0.8 m/s for community ambulation.    Baseline 0.6 m/s    Time 10    Period Weeks    Status Ongoing- 12/24/21- no significant change.    Target Date 01/21/22            PLAN:    PT Frequency 2x / week   PT Duration 12 weeks   PT Treatment/Interventions ADLs/Self Care Home Management;Moist Heat;Cryotherapy;Gait training;Stair training;Functional mobility training;Therapeutic activities;Therapeutic exercise;Balance training;Neuromuscular re-education;Patient/family education;Manual techniques   PT Next Visit Plan continue to progress LLE/LUE strength, balance and gait                   Patient will benefit from skilled therapeutic intervention in order to improve the following deficits and impairments:  Abnormal gait, Decreased endurance, Decreased strength, Decreased balance, Decreased mobility, Decreased safety awareness,  Difficulty walking, Improper body mechanics, Impaired vision/preception    Artist Pais, PTA 01/05/2022, 4:22 PM

## 2022-01-07 ENCOUNTER — Encounter: Payer: PPO | Admitting: Physical Therapy

## 2022-01-12 ENCOUNTER — Encounter: Payer: PPO | Admitting: Physical Therapy

## 2022-01-12 ENCOUNTER — Other Ambulatory Visit (HOSPITAL_COMMUNITY): Payer: Self-pay

## 2022-01-12 MED ORDER — LOSARTAN POTASSIUM 100 MG PO TABS
ORAL_TABLET | ORAL | 1 refills | Status: DC
  Filled 2022-01-12: qty 90, 90d supply, fill #0

## 2022-01-12 MED ORDER — CLOPIDOGREL BISULFATE 75 MG PO TABS
75.0000 mg | ORAL_TABLET | Freq: Every day | ORAL | 5 refills | Status: AC
  Filled 2022-01-12: qty 30, 30d supply, fill #0
  Filled 2022-02-14: qty 30, 30d supply, fill #1
  Filled 2022-03-03 – 2022-03-10 (×2): qty 30, 30d supply, fill #2

## 2022-01-14 ENCOUNTER — Observation Stay (HOSPITAL_COMMUNITY)
Admission: EM | Admit: 2022-01-14 | Discharge: 2022-01-15 | Disposition: A | Discharge status: Still In Hospital | Payer: PPO | Attending: Internal Medicine | Admitting: Internal Medicine

## 2022-01-14 ENCOUNTER — Other Ambulatory Visit: Payer: Self-pay

## 2022-01-14 ENCOUNTER — Emergency Department (HOSPITAL_COMMUNITY): Payer: PPO

## 2022-01-14 ENCOUNTER — Encounter: Payer: PPO | Admitting: Physical Therapy

## 2022-01-14 ENCOUNTER — Encounter (HOSPITAL_COMMUNITY): Payer: Self-pay

## 2022-01-14 ENCOUNTER — Other Ambulatory Visit (HOSPITAL_COMMUNITY): Payer: Self-pay

## 2022-01-14 DIAGNOSIS — Z7982 Long term (current) use of aspirin: Secondary | ICD-10-CM | POA: Diagnosis not present

## 2022-01-14 DIAGNOSIS — F32A Depression, unspecified: Secondary | ICD-10-CM | POA: Diagnosis not present

## 2022-01-14 DIAGNOSIS — Z7902 Long term (current) use of antithrombotics/antiplatelets: Secondary | ICD-10-CM | POA: Insufficient documentation

## 2022-01-14 DIAGNOSIS — R531 Weakness: Secondary | ICD-10-CM | POA: Diagnosis present

## 2022-01-14 DIAGNOSIS — I10 Essential (primary) hypertension: Secondary | ICD-10-CM | POA: Diagnosis present

## 2022-01-14 DIAGNOSIS — R471 Dysarthria and anarthria: Secondary | ICD-10-CM | POA: Insufficient documentation

## 2022-01-14 DIAGNOSIS — I6330 Cerebral infarction due to thrombosis of unspecified cerebral artery: Secondary | ICD-10-CM

## 2022-01-14 DIAGNOSIS — R2689 Other abnormalities of gait and mobility: Secondary | ICD-10-CM | POA: Insufficient documentation

## 2022-01-14 DIAGNOSIS — Z8673 Personal history of transient ischemic attack (TIA), and cerebral infarction without residual deficits: Secondary | ICD-10-CM | POA: Insufficient documentation

## 2022-01-14 DIAGNOSIS — I639 Cerebral infarction, unspecified: Secondary | ICD-10-CM | POA: Diagnosis not present

## 2022-01-14 DIAGNOSIS — F419 Anxiety disorder, unspecified: Secondary | ICD-10-CM

## 2022-01-14 DIAGNOSIS — Z79899 Other long term (current) drug therapy: Secondary | ICD-10-CM | POA: Diagnosis not present

## 2022-01-14 LAB — CBC
HCT: 43.4 % (ref 39.0–52.0)
Hemoglobin: 14.4 g/dL (ref 13.0–17.0)
MCH: 31.6 pg (ref 26.0–34.0)
MCHC: 33.2 g/dL (ref 30.0–36.0)
MCV: 95.4 fL (ref 80.0–100.0)
Platelets: 203 10*3/uL (ref 150–400)
RBC: 4.55 MIL/uL (ref 4.22–5.81)
RDW: 12.6 % (ref 11.5–15.5)
WBC: 5.1 10*3/uL (ref 4.0–10.5)
nRBC: 0 % (ref 0.0–0.2)

## 2022-01-14 LAB — CBG MONITORING, ED: Glucose-Capillary: 109 mg/dL — ABNORMAL HIGH (ref 70–99)

## 2022-01-14 LAB — BASIC METABOLIC PANEL
Anion gap: 9 (ref 5–15)
BUN: 25 mg/dL — ABNORMAL HIGH (ref 8–23)
CO2: 25 mmol/L (ref 22–32)
Calcium: 9.7 mg/dL (ref 8.9–10.3)
Chloride: 104 mmol/L (ref 98–111)
Creatinine, Ser: 0.88 mg/dL (ref 0.61–1.24)
GFR, Estimated: >60 mL/min (ref >60)
Glucose, Bld: 124 mg/dL — ABNORMAL HIGH (ref 70–99)
Potassium: 4.2 mmol/L (ref 3.5–5.1)
Sodium: 138 mmol/L (ref 135–145)

## 2022-01-14 LAB — TROPONIN I (HIGH SENSITIVITY): Troponin I (High Sensitivity): 4 ng/L (ref <18)

## 2022-01-14 MED ORDER — STROKE: EARLY STAGES OF RECOVERY BOOK
Freq: Once | Status: AC
  Filled 2022-01-14: qty 1

## 2022-01-14 MED ORDER — ALPRAZOLAM 0.5 MG PO TABS
2.0000 mg | ORAL_TABLET | Freq: Every evening | ORAL | Status: DC | PRN
  Administered 2022-01-15: 1 mg via ORAL
  Filled 2022-01-14: qty 4

## 2022-01-14 MED ORDER — ASPIRIN 81 MG PO TBEC
81.0000 mg | DELAYED_RELEASE_TABLET | Freq: Every day | ORAL | Status: DC
  Administered 2022-01-14 – 2022-01-15 (×2): 81 mg via ORAL
  Filled 2022-01-14 (×2): qty 1

## 2022-01-14 MED ORDER — ATORVASTATIN CALCIUM 40 MG PO TABS
40.0000 mg | ORAL_TABLET | Freq: Every day | ORAL | Status: DC
Start: 2022-01-15 — End: 2022-01-15
  Administered 2022-01-15: 40 mg via ORAL
  Filled 2022-01-14: qty 1

## 2022-01-14 MED ORDER — CLOPIDOGREL BISULFATE 75 MG PO TABS
75.0000 mg | ORAL_TABLET | Freq: Every day | ORAL | Status: DC
  Administered 2022-01-15: 75 mg via ORAL
  Filled 2022-01-14: qty 1

## 2022-01-14 MED ORDER — SENNOSIDES-DOCUSATE SODIUM 8.6-50 MG PO TABS: 1.0000 | ORAL_TABLET | Freq: Every evening | ORAL | Status: DC | PRN

## 2022-01-14 MED ORDER — ACETAMINOPHEN 650 MG RE SUPP: 650.0000 mg | RECTAL | Status: DC | PRN

## 2022-01-14 MED ORDER — CLOPIDOGREL BISULFATE 75 MG PO TABS
75.0000 mg | ORAL_TABLET | Freq: Once | ORAL | Status: AC
  Administered 2022-01-14: 75 mg via ORAL
  Filled 2022-01-14: qty 1

## 2022-01-14 MED ORDER — ACETAMINOPHEN 160 MG/5ML PO SOLN: 650.0000 mg | ORAL | Status: DC | PRN

## 2022-01-14 MED ORDER — SERTRALINE HCL 100 MG PO TABS: 100.0000 mg | ORAL_TABLET | Freq: Every day | ORAL | Status: DC

## 2022-01-14 MED ORDER — LOSARTAN POTASSIUM 50 MG PO TABS
100.0000 mg | ORAL_TABLET | Freq: Every day | ORAL | Status: DC
  Administered 2022-01-15: 100 mg via ORAL
  Filled 2022-01-14: qty 2

## 2022-01-14 MED ORDER — ENOXAPARIN SODIUM 40 MG/0.4ML IJ SOSY: 40.0000 mg | PREFILLED_SYRINGE | INTRAMUSCULAR | Status: DC

## 2022-01-14 MED ORDER — ACETAMINOPHEN 325 MG PO TABS
650.0000 mg | ORAL_TABLET | ORAL | Status: DC | PRN
  Administered 2022-01-15 (×2): 650 mg via ORAL
  Filled 2022-01-14 (×2): qty 2

## 2022-01-14 NOTE — ED Provider Notes (Signed)
[facility] EMERGENCY DEPARTMENT Provider Note   CSN: [id number] Arrival date & time: 01/14/22  1515     History  Chief Complaint  Patient presents with   Weakness    Charles Hale is a 81 y.o. male with a history of hypertension, high cholesterol, stroke, presented to ED with complaint of right-sided weakness and fatigue.  His daughter at bedside provides supplemental history.  He reports that approximately 4 days ago, on Sunday, his legs "giving out".  He says both legs felt weak, and he has some chronic left leg weakness from a stroke in November, but the right leg now feels weak as well.  He also feels that his speech is slightly more slurred than normal.  He felt that his right hand was "clumsy".  He denies any numbness.  He denies any headache.  He has had approximately 3 falls over the past 4 days due to weakness in his legs, but denies striking his head.  He was seen at an outside hospital ED, per my review of Care Everywhere records, no CT of the head and cervical spine performed on June 25, 4 days ago, which showed no acute traumatic injuries.  He has however concerned that he may have experienced a stroke and that he may need an MRI.  He says his doctor referred him in for an MRI.  He reports he has been taking aspirin but has not been on Plavix until he was restarted today by his PCP.  He denies history of smoking  He reports that he was told he has high cholesterol but does not take his statin medication because of side effects.  HPI     Home Medications Prior to Admission medications   Medication Sig Start Date End Date Taking? Authorizing Provider  alprazolam Prudy Feeler) 2 MG tablet Take 0.5 tablets (1 mg total) by mouth See admin instructions. Take 1 mg by mouth at bedtime and additional 1 mg three to four hours later Patient not taking: Reported on 08/28/2021 06/19/21   Leatha Gilding, MD  alprazolam Prudy Feeler) 2 MG tablet Take 1 tablet (2 mg total) by  mouth at bedtime as needed for sleep 06/09/21     alprazolam Prudy Feeler) 2 MG tablet Take 1 tablet by mouth at bedtime as needed for sleeplessness (10/12/21) 10/04/21     amLODipine (NORVASC) 10 MG tablet Take 10 mg by mouth at bedtime.    [provider]  amLODipine (NORVASC) 10 MG tablet Take 1 tablet (10 mg total) by mouth daily. Patient not taking: Reported on 08/28/2021 08/07/21     aspirin EC 81 MG EC tablet Take 1 tablet (81 mg total) by mouth daily. Swallow whole. 06/20/21   Leatha Gilding, MD  atorvastatin (LIPITOR) 40 MG tablet Take 1 tablet (40 mg total) by mouth daily. Patient not taking: Reported on 08/28/2021 06/20/21   Leatha Gilding, MD  atorvastatin (LIPITOR) 40 MG tablet Take 1 tablet (40 mg total) by mouth daily to lower cholesterol and reduce chance of stroke 07/28/21     clopidogrel (PLAVIX) 75 MG tablet Take 1 tablet by mouth daily. 01/12/22     Cyanocobalamin (VITAMIN B-12 PO) Take 1 tablet by mouth daily.    [provider]  ezetimibe (ZETIA) 10 MG tablet Take 1 tablet by mouth daily 10/04/21     gabapentin (NEURONTIN) 100 MG capsule Take 1 capsule (100 mg total) by mouth 3 (three) times daily. 08/07/21     losartan (COZAAR)  100 MG tablet Take 1 tablet by mouth daily. 01/12/22     meloxicam (MOBIC) 15 MG tablet Take 15 mg by mouth in the morning.    [provider]  Multiple Vitamins-Minerals (ONE-A-DAY MENS 50+) TABS Take 1 tablet by mouth daily.    [provider]  omeprazole (PRILOSEC) 20 MG capsule Take 1 capsule by mouth daily for acid reflux and to prevent ulcer formation 12/14/21     sertraline (ZOLOFT) 100 MG tablet Take 100 mg by mouth at bedtime.    [provider]  sertraline (ZOLOFT) 100 MG tablet Take 1 tablet (100 mg total) by mouth daily. Patient not taking: Reported on 08/28/2021 05/26/21     telmisartan (MICARDIS) 40 MG tablet Take 1 tablet by mouth daily for blood pressure control * Stop amlodipine** 11/15/21     traMADol  (ULTRAM) 50 MG tablet Take 1 to 2 tablets by mouth three times daily as needed for back pain 11/15/21     VITAMIN E PO Take 1 capsule by mouth daily.    [provider]      Allergies    Codeine, Penicillins, and Terbinafine hcl    Review of Systems   Review of Systems  Physical Exam Updated Vital Signs BP 128/82   Pulse 86   Temp 98.2 F (36.8 C) (Oral)   Resp 15   Ht 6\' 1"  (1.854 m)   Wt 81.6 kg   SpO2 96%   BMI 23.75 kg/m  Physical Exam Constitutional:      General: He is not in acute distress. HENT:     Head: Normocephalic and atraumatic.  Eyes:     Conjunctiva/sclera: Conjunctivae normal.     Pupils: Pupils are equal, round, and reactive to light.  Cardiovascular:     Rate and Rhythm: Normal rate and regular rhythm.  Pulmonary:     Effort: Pulmonary effort is normal. No respiratory distress.  Abdominal:     General: There is no distension.     Tenderness: There is no abdominal tenderness.  Skin:    General: Skin is warm and dry.  Neurological:     General: No focal deficit present.     Mental Status: He is alert and oriented to person, place, and time. Mental status is at baseline.     Cranial Nerves: No cranial nerve deficit.     Sensory: No sensory deficit.     Motor: No weakness.     Comments: Speech is clear  Psychiatric:        Mood and Affect: Mood normal.        Behavior: Behavior normal.     ED Results / Procedures / Treatments   Labs (all labs ordered are listed, but only abnormal results are displayed) Labs Reviewed  BASIC METABOLIC PANEL - Abnormal; Notable for the following components:      Result Value   Glucose, Bld 124 (*)    BUN 25 (*)    All other components within normal limits  CBG MONITORING, ED - Abnormal; Notable for the following components:   Glucose-Capillary 109 (*)    All other components within normal limits  CBC  LIPID PANEL  HEMOGLOBIN A1C  TROPONIN I (HIGH SENSITIVITY)    EKG EKG  Interpretation  Date/Time:  Thursday January 14 2022 15:28:55 EDT Ventricular Rate:  78 PR Interval:  176 QRS Duration: 90 QT Interval:  356 QTC Calculation: 405 R Axis:   -31 Text Interpretation: Normal sinus rhythm Left axis  deviation Minimal voltage criteria for LVH, may be normal variant ( R in aVL ) Abnormal ECG When compared with ECG of 15-Jun-2021 18:00, PREVIOUS ECG IS PRESENT Confirmed by Alvester Chou [phone]) on 01/14/2022 4:42:17 PM  Radiology MR BRAIN WO CONTRAST  Result Date: 01/14/2022 CLINICAL DATA:  Acute neuro deficit with right-sided weakness EXAM: MRI HEAD WITHOUT CONTRAST TECHNIQUE: Multiplanar, multiecho pulse sequences of the brain and surrounding structures were obtained without intravenous contrast. COMPARISON:  MRI head 06/16/2021, CT head 01/10/2022 FINDINGS: Brain: 10 mm cute infarct left pons.  No other acute infarct Generalized atrophy. Mild chronic microvascular ischemic change in the white matter. Negative for hemorrhage or mass. Mild ventricular prominence is stable. Vascular: Normal arterial flow voids at the skull base. Skull and upper cervical spine: No focal skeletal lesion. Degenerative change and pannus at C1-2. Sinuses/Orbits: Mild mucosal edema paranasal sinuses. Mild left mastoid effusion. Bilateral cataract extraction Other: None IMPRESSION: 1. 1 cm acute infarct left pons 2. Atrophy and mild chronic microvascular ischemic change in the white matter. Electronically Signed   By: Marlan Palau M.D.   On: 01/14/2022 18:20    Procedures Procedures    Medications Ordered in ED Medications  aspirin EC tablet 81 mg (81 mg Oral Given 01/14/22 1947)   stroke: early stages of recovery book (has no administration in time range)  acetaminophen (TYLENOL) tablet 650 mg (has no administration in time range)    Or  acetaminophen (TYLENOL) 160 MG/5ML solution 650 mg (has no administration in time range)    Or  acetaminophen (TYLENOL) suppository 650 mg (has no  administration in time range)  enoxaparin (LOVENOX) injection 40 mg (40 mg Subcutaneous Not Given 01/14/22 2149)  senna-docusate (Senokot-S) tablet 1 tablet (has no administration in time range)  clopidogrel (PLAVIX) tablet 75 mg (75 mg Oral Given 01/14/22 1947)    ED Course/ Medical Decision Making/ A&P Clinical Course as of 01/14/22 2216  Thu Jan 14, 2022  1848 Patient and his daughter updated regarding acute stroke findings on MRI of the brain.  Neurology consult was placed.  He will be admitted to the hospital for stroke work-up. [MT]  1935 Dr Wilford Corner neurology consulted -recommending repeat CTA head and neck given the patient's vascular findings 6 months ago on his last work-up, as well as admission and repeat of stroke work-up given length of time since last evaluation.  Agrees with aspirin and Plavix.  Awaiting admission call back [MT]  2201 Following admission, patient was refusing CTA.  He says "I do not need it".  I tried to explain why we are ordering the scan, specifically to look at his vasculature, which was not done on a plane scan or MRI.  I explained his prior vascular disease.  He reports that he does not want the iodine contrast because "it makes me crap like crazy".  He is also insistent that he had a CT angiogram performed 2 days ago, although I do not see 1 documented in our system.  I clarified that 4 days ago he had a plain CT of the head and cervical spine performed in the [facility] network, but he says he had an angi[place]  I notified the hospitalist.  I will defer further discussion to the inpatient team [MT]    Clinical Course User Index [MT] Zyair Russi, Kermit Balo, MD  Medical Decision Making Amount and/or Complexity of Data Reviewed Radiology: ordered.  Risk OTC drugs. Prescription drug management. Decision regarding hospitalization.   This patient presents to the ED with concern for right-sided weakness, reporting  also some slurred speech.  This involves an extensive number of treatment options, and is a complaint that carries with it a high risk of complications and morbidity.  The differential diagnosis includes new CVA versus metabolic derangement versus blood sugar imbalance versus other  Co-morbidities that complicate the patient evaluation: History of hypertension and untreated high cholesterol or risk factors for stroke.  Patient is also not been on the Plavix prescribed after his stroke.  Additional history obtained from patient's daughter at bedside  External records from outside source obtained and reviewed including MRI of the brain from November of last year showing a right medulla acute infarct.  This type of infarct likely not be seen on a plain CT head.  Therefore I ordered an MRI of the brain.  He is outside the window for TNK or thrombolytic intervention, with his symptom onset about 4 days ago.  I do not see evidence of LVO at this time.  I ordered and personally interpreted labs.  The pertinent results include: Blood sugars unremarkable.  BMP and CBC unremarkable.  I ordered imaging studies including MRI of the brain without contra I independently visualized and interpreted imaging which showed acute left pons infarct I agree with the radiologist interpretation  The patient was maintained on a cardiac monitor.  I personally viewed and interpreted the cardiac monitored which showed an underlying rhythm of: Normal sinus rhythm  Per my interpretation the patient's ECG shows normal sinus rhythm with no acute ischemic finding  I ordered medication including aspirin and Plavix per discussion with neurology I have reviewed the patients home medicines and have made adjustments as needed   I requested consultation with the neurology,  and discussed lab and imaging findings as well as pertinent plan - they recommend: See ED course  After the interventions noted above, I reevaluated the  patient and found that they have: stayed the same  Dispostion:  After consideration of the diagnostic results and the patients response to treatment, I feel that the patent would benefit from medical admission for stroke work-up.         Final Clinical Impression(s) / ED Diagnoses Final diagnoses:  Cerebrovascular accident (CVA), unspecified mechanism [facility])    Rx / DC Orders ED Discharge Orders     None         Terald Sleeper, MD 01/14/22 2217

## 2022-01-14 NOTE — ED Notes (Signed)
Pt refused CT scan, stated it was unnecessary after going to MRI.

## 2022-01-14 NOTE — ED Notes (Signed)
Pt transported to MRI 

## 2022-01-14 NOTE — Consult Note (Signed)
Neurology Consultation  Reason for Consult: Stroke Referring Physician: Dr. Alvester Chou  CC: Stroke  History is obtained from: Chart, patient  HPI: Charles Hale is a 81 y.o. 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 MRI performed in the emergency department showed a pontine infarct for which neurological consultation was obtained. Patient does not report any focal weakness-reports weakness in bilateral legs but worse on the right leg with the right knee giving out while walking, which technically is not a new problem and is related to his arthritis but over the past few days had much more difficulty with gait than prior.  Family also noticed slurred speech which has somewhat improved since Sunday but is still not back at baseline. Denies chest pain shortness of breath palpitations.  Denies any acute illnesses or sicknesses preceding this. Reports that he was on aspirin and Plavix for 3 weeks after the last stroke and was in rehab for a while and currently only on aspirin. He saw his primary care doctor yesterday and was recommended to be on dual antiplatelets.   LKW: Sometime on Sunday, 01/09/2022 IV thrombolysis given?: no, outside the window Premorbid modified Rankin scale (mRS): 2-3-mostly because of arthritis ROS: Full ROS was performed and is negative except as noted in the HPI.   Past Medical History:  Diagnosis Date   Anxiety    Arthritis    Hypertension    Spinal stenosis    Stroke (HCC) 05/2021     No family history on file.   Social History:   reports that he has never smoked. He has never been exposed to tobacco smoke. He has never used smokeless tobacco. He  reports current alcohol use. He reports that he does not use drugs.  Medications  Current Facility-Administered Medications:    aspirin EC tablet 81 mg, 81 mg, Oral, Daily, Trifan, Kermit Balo, MD, 81 mg at 01/14/22 1947  Current Outpatient Medications:    alprazolam (XANAX) 2 MG tablet, Take 0.5 tablets (1 mg total) by mouth See admin instructions. Take 1 mg by mouth at bedtime and additional 1 mg three to four hours later (Patient not taking: Reported on 08/28/2021), Disp: 10 tablet, Rfl: 0   alprazolam (XANAX) 2 MG tablet, Take 1 tablet (2 mg total) by mouth at bedtime as needed for sleep, Disp: 30 tablet, Rfl: 1   alprazolam (XANAX) 2 MG tablet, Take 1 tablet by mouth at bedtime as needed for sleeplessness (10/12/21), Disp: 30 tablet, Rfl: 3   amLODipine (NORVASC) 10 MG tablet, Take 10 mg by mouth at bedtime., Disp: , Rfl:    amLODipine (NORVASC) 10 MG tablet, Take 1 tablet (10 mg total) by mouth daily. (Patient not taking: Reported on 08/28/2021), Disp: 90 tablet, Rfl: 3   aspirin EC 81 MG EC tablet, Take 1 tablet (81 mg total) by mouth daily. Swallow whole., Disp: 30 tablet, Rfl: 11   atorvastatin (LIPITOR) 40 MG tablet, Take 1 tablet (40 mg total) by mouth daily. (Patient not taking: Reported on 08/28/2021), Disp: , Rfl:    atorvastatin (LIPITOR) 40 MG tablet, Take 1 tablet (40 mg total) by mouth daily to lower cholesterol and reduce chance of stroke, Disp: 90 tablet, Rfl: 1   clopidogrel (PLAVIX) 75  MG tablet, Take 1 tablet by mouth daily., Disp: 30 tablet, Rfl: 5   Cyanocobalamin (VITAMIN B-12 PO), Take 1 tablet by mouth daily., Disp: , Rfl:    ezetimibe (ZETIA) 10 MG tablet, Take 1 tablet by mouth daily, Disp: 30 tablet, Rfl: 3   gabapentin (NEURONTIN) 100 MG capsule, Take 1 capsule (100 mg total) by mouth 3 (three) times daily., Disp: 270 capsule, Rfl: 3   losartan (COZAAR) 100 MG tablet, Take 1 tablet by mouth daily., Disp: 90 tablet, Rfl: 1   meloxicam (MOBIC) 15 MG tablet, Take 15 mg by  mouth in the morning., Disp: , Rfl:    Multiple Vitamins-Minerals (ONE-A-DAY MENS 50+) TABS, Take 1 tablet by mouth daily., Disp: , Rfl:    omeprazole (PRILOSEC) 20 MG capsule, Take 1 capsule by mouth daily for acid reflux and to prevent ulcer formation, Disp: 90 capsule, Rfl: 1   sertraline (ZOLOFT) 100 MG tablet, Take 100 mg by mouth at bedtime., Disp: , Rfl:    sertraline (ZOLOFT) 100 MG tablet, Take 1 tablet (100 mg total) by mouth daily. (Patient not taking: Reported on 08/28/2021), Disp: 90 tablet, Rfl: 1   telmisartan (MICARDIS) 40 MG tablet, Take 1 tablet by mouth daily for blood pressure control * Stop amlodipine**, Disp: 30 tablet, Rfl: 3   traMADol (ULTRAM) 50 MG tablet, Take 1 to 2 tablets by mouth three times daily as needed for back pain, Disp: 36 tablet, Rfl: 1   VITAMIN E PO, Take 1 capsule by mouth daily., Disp: , Rfl:    Exam: Current vital signs: BP (!) 132/97   Pulse 65   Temp 98.2 F (36.8 C) (Oral)   Resp 16   Ht 6\' 1"  (1.854 m)   Wt 81.6 kg   SpO2 98%   BMI 23.75 kg/m  Vital signs in last 24 hours: Temp:  [98.2 F (36.8 C)-98.3 F (36.8 C)] 98.2 F (36.8 C) (06/29 1642) Pulse Rate:  [65-86] 65 (06/29 1945) Resp:  [15-19] 16 (06/29 1945) BP: (125-161)/(80-102) 132/97 (06/29 1945) SpO2:  [93 %-100 %] 98 % (06/29 1945) Weight:  [81.6 kg] 81.6 kg (06/29 1642) General: Awake alert in no distress HEENT: Normocephalic atraumatic CVS: Regular rate rhythm Respiratory: Chest is clear Abdomen nondistended nontender Extremities are warm and well perfused with intact pulses On neurological examination: He is awake alert oriented x3, mild dysarthria, no evidence of aphasia.  Cranial nerves II to XII are grossly intact.  Motor examination with no drift in any of the 4 extremities although bilateral lower extremities are 4+/5 in strength-exam somewhat limited by pain.  Sensation is diminished in a glove and stocking type pattern but no focality.  No obvious  dysmetria. NIH stroke scale -1 dysarthria   Labs I have reviewed labs in epic and the results pertinent to this consultation are:  CBC    Component Value Date/Time   WBC 5.1 01/14/2022 1524   RBC 4.55 01/14/2022 1524   HGB 14.4 01/14/2022 1524   HCT 43.4 01/14/2022 1524   PLT 203 01/14/2022 1524   MCV 95.4 01/14/2022 1524   MCH 31.6 01/14/2022 1524   MCHC 33.2 01/14/2022 1524   RDW 12.6 01/14/2022 1524   LYMPHSABS 0.4 (L) 06/16/2021 1715   MONOABS 0.3 06/16/2021 1715   EOSABS 0.0 06/16/2021 1715   BASOSABS 0.0 06/16/2021 1715    CMP     Component Value Date/Time   NA 138 01/14/2022 1524   K 4.2 01/14/2022 1524   CL 104  01/14/2022 1524   CO2 25 01/14/2022 1524   GLUCOSE 124 (H) 01/14/2022 1524   BUN 25 (H) 01/14/2022 1524   CREATININE 0.88 01/14/2022 1524   CALCIUM 9.7 01/14/2022 1524   PROT 7.4 06/15/2021 1902   ALBUMIN 4.5 06/15/2021 1902   AST 34 06/15/2021 1902   ALT 34 06/15/2021 1902   ALKPHOS 87 06/15/2021 1902   BILITOT 0.4 06/15/2021 1902   GFRNONAA >60 01/14/2022 1524    Lipid Panel     Component Value Date/Time   CHOL 176 06/17/2021 0405   TRIG 107 06/17/2021 0405   HDL 59 06/17/2021 0405   CHOLHDL 3.0 06/17/2021 0405   VLDL 21 06/17/2021 0405   LDLCALC 96 06/17/2021 0405     Imaging I have reviewed the images obtained:  MRI brain: 1 cm acute pontine infarct on the left.  Assessment: 81 year old with prior stroke and minimal residual weakness from the right ventral medullary stroke in November now presenting with dysarthria and gait abnormality and noted to have a left pontine infarct. CT angiography head and neck from November had multifocal atherosclerotic disease in anterior and posterior circulation. I would suspect that his current strokes are either small vessel etiology versus atheroembolic.  Recommendations: Admit to hospitalist Frequent neurochecks No need for permissive hypertension-goal blood pressure 140/90 or below since last  known well is 4 to 5 days ago. Aspirin 325+ Plavix 75 for 3 months. High intensity statin CT angio head and neck 2D echo A1c Lipid panel PT OT Speech therapy  Stroke team to follow. Plan discussed with Dr. Renaye Rakers  -- Milon Dikes, MD Neurologist [facility] Pager: [phone]

## 2022-01-14 NOTE — ED Provider Triage Note (Signed)
Emergency Medicine Provider Triage Evaluation Note  Charles Hale , a 81 y.o. male  was evaluated in triage.  Pt complains of weakness and concern of possible stroke.  Patient states that 2 days ago both legs gave out.  He states take him hours to get off the floor.  States that since that time he has been unable to write with his right hand which is abnormal for him.  Patient has history of stroke in November of this year and states that he was in rehab for 5 months.  Patient is alert and oriented at this time.  Patient outside of any stroke window at this time.  Endorses right-sided weakness, had previous left-sided deficits.  Patient not taking blood thinners at this time  Of note, care everywhere shows a visit at [facility] on June 25 for bilateral weakness.  Review of Systems  Positive: Weakness Negative: Headache, head trauma  Physical Exam  There were no vitals taken for this visit. Gen:   Awake, no distress   Resp:  Normal effort  MSK:   Moves extremities without difficulty, patient in wheelchair Other:    Medical Decision Making  Medically screening exam initiated at 3:22 PM.  Appropriate orders placed.  Charles Hale was informed that the remainder of the evaluation will be completed by another provider, this initial triage assessment does not replace that evaluation, and the importance of remaining in the ED until their evaluation is complete.     Darrick Grinder, PA-C 01/14/22 1526

## 2022-01-14 NOTE — ED Notes (Signed)
While pt in MRI daughter states that pt has been expressing SI stating he wishes "next stroke would just take him out."  That he wishes "it would all end."  MD Trifan notified.  No further orders at this time.

## 2022-01-14 NOTE — ED Triage Notes (Signed)
Pt reports she thinks he may have had another stroke, last stroke in November that affected his left side, was in rehab for 5 months. Then two days ago he felt his legs give out and he fell to the ground. He reports feeling more weak on his right side. Denies head injury, denies taking blood thinners. He also states his speech is slurred.

## 2022-01-15 ENCOUNTER — Observation Stay (HOSPITAL_BASED_OUTPATIENT_CLINIC_OR_DEPARTMENT_OTHER): Payer: PPO

## 2022-01-15 ENCOUNTER — Other Ambulatory Visit (HOSPITAL_COMMUNITY): Payer: Self-pay

## 2022-01-15 DIAGNOSIS — I6302 Cerebral infarction due to thrombosis of basilar artery: Secondary | ICD-10-CM | POA: Diagnosis not present

## 2022-01-15 DIAGNOSIS — F419 Anxiety disorder, unspecified: Secondary | ICD-10-CM

## 2022-01-15 DIAGNOSIS — Z8673 Personal history of transient ischemic attack (TIA), and cerebral infarction without residual deficits: Secondary | ICD-10-CM | POA: Diagnosis not present

## 2022-01-15 DIAGNOSIS — F32A Depression, unspecified: Secondary | ICD-10-CM | POA: Diagnosis not present

## 2022-01-15 DIAGNOSIS — I10 Essential (primary) hypertension: Secondary | ICD-10-CM | POA: Diagnosis not present

## 2022-01-15 DIAGNOSIS — I6389 Other cerebral infarction: Secondary | ICD-10-CM | POA: Diagnosis not present

## 2022-01-15 DIAGNOSIS — E7800 Pure hypercholesterolemia, unspecified: Secondary | ICD-10-CM | POA: Diagnosis not present

## 2022-01-15 DIAGNOSIS — I639 Cerebral infarction, unspecified: Secondary | ICD-10-CM | POA: Diagnosis not present

## 2022-01-15 LAB — LIPID PANEL
Cholesterol: 181 mg/dL (ref 0–200)
HDL: 50 mg/dL (ref >40)
LDL Cholesterol: 103 mg/dL — ABNORMAL HIGH (ref 0–99)
Total CHOL/HDL Ratio: 3.6 RATIO
Triglycerides: 139 mg/dL (ref <150)
VLDL: 28 mg/dL (ref 0–40)

## 2022-01-15 LAB — ECHOCARDIOGRAM COMPLETE
Area-P 1/2: 2.8 cm2
Calc EF: 63.9 %
Height: 73 in
S' Lateral: 2.5 cm
Single Plane A2C EF: 65.8 %
Single Plane A4C EF: 62 %
Weight: 2733.7 oz

## 2022-01-15 MED ORDER — ROSUVASTATIN CALCIUM 20 MG PO TABS
20.0000 mg | ORAL_TABLET | Freq: Every day | ORAL | 2 refills | Status: DC
  Filled 2022-01-15: qty 30, 30d supply, fill #0

## 2022-01-15 MED ORDER — ROSUVASTATIN CALCIUM 20 MG PO TABS
20.0000 mg | ORAL_TABLET | Freq: Every day | ORAL | Status: DC
  Administered 2022-01-15: 20 mg via ORAL
  Filled 2022-01-15: qty 1

## 2022-01-15 MED ORDER — ASPIRIN 81 MG PO TBEC: 81.0000 mg | DELAYED_RELEASE_TABLET | Freq: Every day | ORAL | Status: DC

## 2022-01-15 MED ORDER — EZETIMIBE 10 MG PO TABS
10.0000 mg | ORAL_TABLET | Freq: Every day | ORAL | Status: DC
  Administered 2022-01-15: 10 mg via ORAL
  Filled 2022-01-15: qty 1

## 2022-01-15 NOTE — Assessment & Plan Note (Signed)
-   MRI brain revealing 1 cm acute infarct left pons.  Patient refuses CTA head and neck stating he has received another CT scan at outside ED.  On chart review, he only received a CT cervical spine several days ago. -also refusing Echocardiogram but have ordered in case he changes his mind -Continue aspirin 325+ Plavix 75 mg for 3 months - Continue high intensity statin -Obtain A1c and lipids -PT/OT/SLT -Frequent neuro checks and keep on telemetry -No need for permissive hypertension per neuro.  BP goal of 140/90.

## 2022-01-15 NOTE — Assessment & Plan Note (Signed)
-   Continue Xanax 

## 2022-01-15 NOTE — TOC Transition Note (Signed)
Transition of Care [facility]) - CM/SW Discharge Note   Patient Details  Name: [name] MRN: [id number] Date of Birth: 1941/05/03  Transition of Care [facility]) CM/SW Contact:  Bess Kinds, RN Phone Number: [phone] 01/15/2022, 2:43 PM   Clinical Narrative:     Referral accepted by [facility] for PT, OT, SLP with delayed start of care for Monday 01/18/2022. Patient and MD aware. No further TOC needs identified at this time.   Final next level of care: Home w Home Health Services Barriers to Discharge: No Barriers Identified   Patient Goals and CMS Choice Patient states their goals for this hospitalization and ongoing recovery are:: return home CMS Medicare.gov Compare Post Acute Care list provided to:: Patient Choice offered to / list presented to : Patient  Discharge Placement                       Discharge Plan and Services                DME Arranged: N/A DME Agency: NA       HH Arranged: PT, OT, Speech Therapy HH Agency: [facility] Date HH Agency Contacted: 01/15/22 Time HH Agency Contacted: [phone] Representative spoke with at [facility] Agency: Morrie Sheldon  Social Determinants of Health (SDOH) Interventions     Readmission Risk Interventions     No data to display

## 2022-01-15 NOTE — Discharge Summary (Signed)
Physician Discharge Summary  Charles Hale GMW:[id number] DOB: 1940/12/19 DOA: 01/14/2022  PCP: Doreen Salvage, PA-C  Admit date: 01/14/2022 Discharge date: 01/15/2022  Admitted From: Home Disposition: Home  Recommendations for Outpatient Follow-up:  Follow up with PCP in 1-2 weeks Follow-up with neurology as discussed  Home Health: Home health PT Equipment/Devices: None  Discharge Condition: Stable CODE STATUS: Full Diet recommendation: Low-salt low-fat diet  Brief/Interim Summary:  Charles Hale is a 81 y.o. male with medical history significant of History of CVA in 05/2021, hypertension who presents with right-sided weakness for the past several days.  Patient admitted for acute CVA as outlined below, confirmed on imaging, deficits improving, patient recommended for echocardiogram and CTA to rule out vascular etiology but has been refusing repeat imaging with contrast.  We explained that without further imaging we could not confirm his risk factors or need for possible procedure in the near future.  Patient was agreeable to echocardiogram today but nothing else.  As such recommending close follow-up with PCP and neurology in the outpatient setting, continue aspirin 81 mg for 3 months, Plavix ongoing as well as increased statin to Crestor.  Otherwise stable and agreeable for discharge home   Discharge Diagnoses:  Principal Problem:   Stroke [facility]) Active Problems:   Essential hypertension   Depression   Anxiety    Discharge Instructions  Discharge Instructions     Discharge patient   Complete by: As directed    Discharge disposition: 06-Home-Health Care Svc   Discharge patient date: 01/15/2022   Face-to-face encounter (required for Medicare/Medicaid patients)   Complete by: As directed    I Azucena Fallen certify that this patient is under my care and that I, or a nurse practitioner or physician's assistant working with me, had a face-to-face encounter that meets  the physician face-to-face encounter requirements with this patient on 01/15/2022. The encounter with the patient was in whole, or in part for the following medical condition(s) which is the primary reason for home health care (List medical condition): Ambulatory dysfunction   The encounter with the patient was in whole, or in part, for the following medical condition, which is the primary reason for home health care: Ambulatory dysfunction   I certify that, based on my findings, the following services are medically necessary home health services: Physical therapy   Reason for Medically Necessary Home Health Services: Therapy- Investment banker, operational, Patent examiner   My clinical findings support the need for the above services: Unable to leave home safely without assistance and/or assistive device   Further, I certify that my clinical findings support that this patient is homebound due to: Unable to leave home safely without assistance   Home Health   Complete by: As directed    To provide the following care/treatments: PT      Allergies as of 01/15/2022       Reactions   Codeine Nausea Only   Penicillins Hives   Terbinafine Hcl Hives, Other (See Comments)   "Hives for 2 weeks"        Medication List     STOP taking these medications    amLODipine 10 MG tablet Commonly known as: NORVASC   atorvastatin 40 MG tablet Commonly known as: LIPITOR   ezetimibe 10 MG tablet Commonly known as: ZETIA   gabapentin 100 MG capsule Commonly known as: NEURONTIN   omeprazole 20 MG capsule Commonly known as: PRILOSEC   telmisartan 40 MG tablet Commonly known as: MICARDIS  traMADol 50 MG tablet Commonly known as: ULTRAM   VITAMIN B-12 PO       TAKE these medications    alprazolam 2 MG tablet Commonly known as: XANAX Take 1 tablet by mouth at bedtime as needed for sleeplessness (10/12/21)   aspirin EC 81 MG tablet Take 1 tablet (81 mg total) by mouth daily. Swallow  whole.   clopidogrel 75 MG tablet Commonly known as: PLAVIX Take 1 tablet by mouth daily.   losartan 100 MG tablet Commonly known as: COZAAR Take 1 tablet by mouth daily.   One-A-Day Mens 50+ Tabs Take 1 tablet by mouth daily.   rosuvastatin 20 MG tablet Commonly known as: CRESTOR Take 1 tablet (20 mg total) by mouth daily. Start taking on: January 16, 2022   sertraline 100 MG tablet Commonly known as: ZOLOFT Take 1 tablet (100 mg total) by mouth daily.   VITAMIN E PO Take 1 capsule by mouth daily.        Follow-up Information     [facility] up.   Why: someone from the office will call to schedule home health visits for physical therapy - agency was advised of request for afternoon appointments Contact information: [address] [place], [place] [phone]               Allergies  Allergen Reactions   Codeine Nausea Only   Penicillins Hives   Terbinafine Hcl Hives and Other (See Comments)    "Hives for 2 weeks"    Consultations: Neurology  Procedures/Studies: ECHOCARDIOGRAM COMPLETE  Result Date: 01/15/2022    ECHOCARDIOGRAM REPORT   Patient Name:   Charles Hale Date of Exam: 01/15/2022 Medical Rec #:  [id number]       Height:       73.0 in Accession #:    [id number]      Weight:       170.9 lb Date of Birth:  1941-01-22      BSA:          2.012 m Patient Age:    [age] years        BP:           131/72 mmHg Patient Gender: M               HR:           67 bpm. Exam Location:  Inpatient Procedure: 2D Echo, Cardiac Doppler and Color Doppler Indications:    Stroke I63.9  History:        Patient has prior history of Echocardiogram examinations, most                 recent 06/17/2021. Stroke; Risk Factors:Hypertension.  Sonographer:    Eulah PontSarah Pirrotta RDCS Referring Phys: [id number] CHING T TU IMPRESSIONS  1. Left ventricular ejection fraction, by estimation, is 60 to 65%. The left ventricle has  normal function. The left ventricle has no regional wall motion abnormalities. Left ventricular diastolic parameters were normal.  2. Right ventricular systolic function is normal. The right ventricular size is normal. There is normal pulmonary artery systolic pressure.  3. The mitral valve is abnormal. Mild mitral valve regurgitation. No evidence of mitral stenosis. Moderate mitral annular calcification.  4. Tricuspid valve regurgitation is mild to moderate.  5. The aortic valve is tricuspid. There is mild calcification of the aortic valve. Aortic valve regurgitation is trivial. Aortic valve sclerosis is present, with no evidence of  aortic valve stenosis.  6. Aortic dilatation noted. There is moderate dilatation of the ascending aorta, measuring 42 mm.  7. The inferior vena cava is normal in size with greater than 50% respiratory variability, suggesting right atrial pressure of 3 mmHg. FINDINGS  Left Ventricle: Left ventricular ejection fraction, by estimation, is 60 to 65%. The left ventricle has normal function. The left ventricle has no regional wall motion abnormalities. The left ventricular internal cavity size was normal in size. There is  no left ventricular hypertrophy. Left ventricular diastolic parameters were normal. Right Ventricle: The right ventricular size is normal. No increase in right ventricular wall thickness. Right ventricular systolic function is normal. There is normal pulmonary artery systolic pressure. The tricuspid regurgitant velocity is 2.76 m/s, and  with an assumed right atrial pressure of 3 mmHg, the estimated right ventricular systolic pressure is 33.5 mmHg. Left Atrium: Left atrial size was normal in size. Right Atrium: Right atrial size was normal in size. Pericardium: There is no evidence of pericardial effusion. Mitral Valve: The mitral valve is abnormal. There is mild thickening of the mitral valve leaflet(s). There is mild calcification of the mitral valve leaflet(s). Moderate  mitral annular calcification. Mild mitral valve regurgitation. No evidence of mitral  valve stenosis. Tricuspid Valve: The tricuspid valve is normal in structure. Tricuspid valve regurgitation is mild to moderate. No evidence of tricuspid stenosis. Aortic Valve: The aortic valve is tricuspid. There is mild calcification of the aortic valve. Aortic valve regurgitation is trivial. Aortic valve sclerosis is present, with no evidence of aortic valve stenosis. Pulmonic Valve: The pulmonic valve was normal in structure. Pulmonic valve regurgitation is mild. No evidence of pulmonic stenosis. Aorta: Aortic dilatation noted. There is moderate dilatation of the ascending aorta, measuring 42 mm. Venous: The inferior vena cava is normal in size with greater than 50% respiratory variability, suggesting right atrial pressure of 3 mmHg. IAS/Shunts: No atrial level shunt detected by color flow Doppler.  LEFT VENTRICLE PLAX 2D LVIDd:         4.80 cm     Diastology LVIDs:         2.50 cm     LV e' medial:    6.75 cm/s LV PW:         1.10 cm     LV E/e' medial:  10.6 LV IVS:        0.90 cm     LV e' lateral:   11.20 cm/s LVOT diam:     2.00 cm     LV E/e' lateral: 6.4 LV SV:         62 LV SV Index:   31 LVOT Area:     3.14 cm  LV Volumes (MOD) LV vol d, MOD A2C: 76.9 ml LV vol d, MOD A4C: 86.5 ml LV vol s, MOD A2C: 26.3 ml LV vol s, MOD A4C: 32.9 ml LV SV MOD A2C:     50.6 ml LV SV MOD A4C:     86.5 ml LV SV MOD BP:      52.3 ml RIGHT VENTRICLE RV S prime:     10.60 cm/s TAPSE (M-mode): 2.0 cm LEFT ATRIUM             Index        RIGHT ATRIUM           Index LA diam:        3.60 cm 1.79 cm/m   RA Area:     15.90 cm LA  Vol [facility]):   36.1 ml 17.94 ml/m  RA Volume:   35.80 ml  17.79 ml/m LA Vol (A4C):   36.1 ml 17.94 ml/m LA Biplane Vol: 37.9 ml 18.83 ml/m  AORTIC VALVE LVOT Vmax:   97.50 cm/s LVOT Vmean:  62.100 cm/s LVOT VTI:    0.196 m  AORTA Ao Root diam: 3.10 cm Ao Asc diam:  4.20 cm MITRAL VALVE               TRICUSPID VALVE  MV Area (PHT): 2.80 cm    TR Peak grad:   30.5 mmHg MV Decel Time: 271 msec    TR Vmax:        276.00 cm/s MV E velocity: 71.40 cm/s MV A velocity: 78.30 cm/s  SHUNTS MV E/A ratio:  0.91        Systemic VTI:  0.20 m                            Systemic Diam: 2.00 cm Charlton Haws MD Electronically signed by Charlton Haws MD Signature Date/Time: 01/15/2022/10:28:43 AM    Final    MR BRAIN WO CONTRAST  Result Date: 01/14/2022 CLINICAL DATA:  Acute neuro deficit with right-sided weakness EXAM: MRI HEAD WITHOUT CONTRAST TECHNIQUE: Multiplanar, multiecho pulse sequences of the brain and surrounding structures were obtained without intravenous contrast. COMPARISON:  MRI head 06/16/2021, CT head 01/10/2022 FINDINGS: Brain: 10 mm cute infarct left pons.  No other acute infarct Generalized atrophy. Mild chronic microvascular ischemic change in the white matter. Negative for hemorrhage or mass. Mild ventricular prominence is stable. Vascular: Normal arterial flow voids at the skull base. Skull and upper cervical spine: No focal skeletal lesion. Degenerative change and pannus at C1-2. Sinuses/Orbits: Mild mucosal edema paranasal sinuses. Mild left mastoid effusion. Bilateral cataract extraction Other: None IMPRESSION: 1. 1 cm acute infarct left pons 2. Atrophy and mild chronic microvascular ischemic change in the white matter. Electronically Signed   By: Marlan Palau M.D.   On: 01/14/2022 18:20     Subjective: No acute issues or events overnight   Discharge Exam: Vitals:   01/15/22 0312 01/15/22 0818  BP: 131/72 123/73  Pulse: (!) 58 (!) 58  Resp: 17 18  Temp: 98.3 F (36.8 C) 98.4 F (36.9 C)  SpO2: 97% 100%   Vitals:   01/15/22 0107 01/15/22 0129 01/15/22 0312 01/15/22 0818  BP: (!) 149/81 (!) 146/86 131/72 123/73  Pulse: 62 (!) 59 (!) 58 (!) 58  Resp: 18 18 17 18   Temp: 98 F (36.7 C) 97.6 F (36.4 C) 98.3 F (36.8 C) 98.4 F (36.9 C)  TempSrc: Oral Oral    SpO2: 96% 100% 97% 100%  Weight:   77.5 kg    Height:  6\' 1"  (1.854 m)      General: Pt is alert, awake, not in acute distress Cardiovascular: RRR, S1/S2 +, no rubs, no gallops Respiratory: CTA bilaterally, no wheezing, no rhonchi Abdominal: Soft, NT, ND, bowel sounds + Extremities: no edema, no cyanosis    The results of significant diagnostics from this hospitalization (including imaging, microbiology, ancillary and laboratory) are listed below for reference.     Microbiology: No results found for this or any previous visit (from the past 240 hour(s)).   Labs: BNP (last 3 results) No results for input(s): "BNP" in the last 8760 hours. Basic Metabolic Panel: Recent Labs  Lab 01/14/22 1524  NA 138  K 4.2  CL 104  CO2 25  GLUCOSE 124*  BUN 25*  CREATININE 0.88  CALCIUM 9.7   Liver Function Tests: No results for input(s): "AST", "ALT", "ALKPHOS", "BILITOT", "PROT", "ALBUMIN" in the last 168 hours. No results for input(s): "LIPASE", "AMYLASE" in the last 168 hours. No results for input(s): "AMMONIA" in the last 168 hours. CBC: Recent Labs  Lab 01/14/22 1524  WBC 5.1  HGB 14.4  HCT 43.4  MCV 95.4  PLT 203   Cardiac Enzymes: No results for input(s): "CKTOTAL", "CKMB", "CKMBINDEX", "TROPONINI" in the last 168 hours. BNP: Invalid input(s): "POCBNP" CBG: Recent Labs  Lab 01/14/22 1653  GLUCAP 109*   D-Dimer No results for input(s): "DDIMER" in the last 72 hours. Hgb A1c No results for input(s): "HGBA1C" in the last 72 hours. Lipid Profile Recent Labs    01/15/22 0906  CHOL 181  HDL 50  LDLCALC 103*  TRIG 139  CHOLHDL 3.6   Thyroid function studies No results for input(s): "TSH", "T4TOTAL", "T3FREE", "THYROIDAB" in the last 72 hours.  Invalid input(s): "FREET3" Anemia work up No results for input(s): "VITAMINB12", "FOLATE", "FERRITIN", "TIBC", "IRON", "RETICCTPCT" in the last 72 hours. Urinalysis    Component Value Date/Time   COLORURINE YELLOW 06/15/2021 1902   APPEARANCEUR  CLEAR 06/15/2021 1902   LABSPEC 1.030 06/15/2021 1902   PHURINE 5.5 06/15/2021 1902   GLUCOSEU NEGATIVE 06/15/2021 1902   HGBUR NEGATIVE 06/15/2021 1902   BILIRUBINUR NEGATIVE 06/15/2021 1902   KETONESUR 15 (A) 06/15/2021 1902   PROTEINUR NEGATIVE 06/15/2021 1902   NITRITE NEGATIVE 06/15/2021 1902   LEUKOCYTESUR NEGATIVE 06/15/2021 1902   Sepsis Labs Recent Labs  Lab 01/14/22 1524  WBC 5.1   Microbiology No results found for this or any previous visit (from the past 240 hour(s)).   Time coordinating discharge: Over 30 minutes  SIGNED:   Azucena Fallen, DO [facility] 01/15/2022, 2:25 PM Pager   If 7PM-7AM, please contact night-coverage [email or web address]

## 2022-01-15 NOTE — Evaluation (Signed)
Occupational Therapy Evaluation Patient Details Name: Charles Hale MRN: [id number] DOB: [birth year] Today's Date: 01/15/2022   History of Present Illness 81 yo male with onset of generalized and R side weakness was admitted on 6/29 for consistent R side weakness that made walking difficult and speech was slurred.  Pt has been resistant about getting all his testing done with MD's, reporting it was repetitive. MRI of brain shows L pons infarct,  PMHx:  L hemiparesis, anxiety, OA R knee, HTN, spinal stenosis, stroke, HTN,   Clinical Impression   Pt admitted for concerns listed above. PTA pt reported that he was independent with all ADL's and IADL's, including driving. At this time, pt presents with increased weakness, balance deficits, fine motor deficits, and mild decreased safety awareness. He is requiring min guard for all ADL's, and set up for tasks that require fine motor skills, such as opening containers and writing. Recommending HHOT at this time to maximize his independence and safety. OT will follow acutely.       Recommendations for follow up therapy are one component of a multi-disciplinary discharge planning process, led by the attending physician.  Recommendations may be updated based on patient status, additional functional criteria and insurance authorization.   Follow Up Recommendations  Home health OT    Assistance Recommended at Discharge Set up Supervision/Assistance  Patient can return home with the following A little help with walking and/or transfers;A little help with bathing/dressing/bathroom;Direct supervision/assist for medications management    Functional Status Assessment  Patient has had a recent decline in their functional status and demonstrates the ability to make significant improvements in function in a reasonable and predictable amount of time.  Equipment Recommendations  None recommended by OT    Recommendations for Other Services       Precautions  / Restrictions Precautions Precautions: Fall Precaution Comments: R knee OA Restrictions Weight Bearing Restrictions: No      Mobility Bed Mobility               General bed mobility comments: up in chair when OT arrived    Transfers Overall transfer level: Needs assistance Equipment used: Rolling walker (2 wheels) Transfers: Sit to/from Stand Sit to Stand: Min guard           General transfer comment: min guard for safety      Balance Overall balance assessment: Needs assistance Sitting-balance support: Feet supported Sitting balance-Leahy Scale: Good     Standing balance support: Bilateral upper extremity supported, During functional activity Standing balance-Leahy Scale: Fair                             ADL either performed or assessed with clinical judgement   ADL Overall ADL's : Needs assistance/impaired Eating/Feeding: Set up;Sitting   Grooming: Supervision/safety;Standing   Upper Body Bathing: Set up;Sitting   Lower Body Bathing: Min guard;Sit to/from stand;Sitting/lateral leans   Upper Body Dressing : Set up;Sitting   Lower Body Dressing: Min guard;Sitting/lateral leans;Sit to/from stand   Toilet Transfer: Min guard;Ambulation   Toileting- Clothing Manipulation and Hygiene: Min guard;Sitting/lateral lean;Sit to/from stand       Functional mobility during ADLs: Min guard;Rolling walker (2 wheels) General ADL Comments: Requiring increased time due to poor coordination, as well as min guard assist for safety     Vision Baseline Vision/History: 1 Wears glasses Ability to See in Adequate Light: 1 Impaired Patient Visual Report: No change from baseline Vision Assessment?:  No apparent visual deficits     Perception     Praxis      Pertinent Vitals/Pain Pain Assessment Pain Assessment: No/denies pain     Hand Dominance Right   Extremity/Trunk Assessment Upper Extremity Assessment Upper Extremity Assessment: Generalized  weakness (mild incoordination deficit)   Lower Extremity Assessment Lower Extremity Assessment: Defer to PT evaluation RLE Deficits / Details: 4+ strength on RLE RLE Coordination: decreased gross motor LLE Deficits / Details: 4 to 4+ strength LLE LLE Coordination: decreased gross motor   Cervical / Trunk Assessment Cervical / Trunk Assessment: Kyphotic (spinal stenosis)   Communication Communication Communication: Expressive difficulties (mild dysarthria)   Cognition Arousal/Alertness: Awake/alert Behavior During Therapy: WFL for tasks assessed/performed Overall Cognitive Status: Within Functional Limits for tasks assessed                                       General Comments  VSS on RA    Exercises     Shoulder Instructions      Home Living Family/patient expects to be discharged to:: Private residence Living Arrangements: Alone Available Help at Discharge: Family (daughter is there per pt) Type of Home: House Home Access: Stairs to enter Secretary/administrator of Steps: 2 Entrance Stairs-Rails: None Home Layout: One level     Bathroom Shower/Tub: Chief Strategy Officer: Standard     Home Equipment: Grab bars - tub/shower;Cane - single Librarian, academic (2 wheels);Shower seat;Toilet riser          Prior Functioning/Environment Prior Level of Function : Needs assist       Physical Assist : Mobility (physical) Mobility (physical): Gait   Mobility Comments: previously was on [facility] then this incident required RW ADLs Comments: indep        OT Problem List: Decreased strength;Decreased activity tolerance;Impaired balance (sitting and/or standing);Decreased coordination;Decreased safety awareness      OT Treatment/Interventions: Self-care/ADL training;Therapeutic exercise;Neuromuscular education;Energy conservation;DME and/or AE instruction;Therapeutic activities;Patient/family education;Balance training    OT Goals(Current  goals can be found in the care plan section) Acute Rehab OT Goals Patient Stated Goal: To go home now OT Goal Formulation: With patient Time For Goal Achievement: 01/29/22 Potential to Achieve Goals: Good  OT Frequency: Min 2X/week    Co-evaluation              AM-PAC OT "6 Clicks" Daily Activity     Outcome Measure Help from another person eating meals?: A Little Help from another person taking care of personal grooming?: A Little Help from another person toileting, which includes using toliet, bedpan, or urinal?: A Little Help from another person bathing (including washing, rinsing, drying)?: A Little Help from another person to put on and taking off regular upper body clothing?: A Little Help from another person to put on and taking off regular lower body clothing?: A Little 6 Click Score: 18   End of Session Equipment Utilized During Treatment: Gait belt;Rolling walker (2 wheels) Nurse Communication: Mobility status  Activity Tolerance: Patient tolerated treatment well Patient left: in chair;with call bell/phone within reach  OT Visit Diagnosis: Unsteadiness on feet (R26.81);Other abnormalities of gait and mobility (R26.89);Muscle weakness (generalized) (M62.81)                Time: 1607-3710 OT Time Calculation (min): 10 min Charges:  OT General Charges $OT Visit: 1 Visit OT Evaluation $OT Eval Low Complexity: 1 Low  Sharley Keeler  H., OTR/L Acute Rehabilitation  Kaelah Hayashi Elane Bing Plume 01/15/2022, 2:33 PM

## 2022-01-15 NOTE — Plan of Care (Signed)
  Problem: Safety: Goal: Ability to remain free from injury will improve Outcome: Progressing   Problem: Education: Goal: Knowledge of disease or condition will improve Outcome: Progressing Goal: Knowledge of secondary prevention will improve (SELECT ALL) Outcome: Progressing   Problem: Coping: Goal: Will verbalize positive feelings about self Outcome: Progressing   Problem: Self-Care: Goal: Ability to participate in self-care as condition permits will improve Outcome: Progressing Goal: Verbalization of feelings and concerns over difficulty with self-care will improve Outcome: Progressing   

## 2022-01-15 NOTE — Progress Notes (Addendum)
STROKE TEAM PROGRESS NOTE   INTERVAL HISTORY No family at the bedside, patient does feel his symptoms are improving. He does still have some right leg ataxia and dexterity issues with his right hand. Speech is nearly back to normal.   Vitals:   01/15/22 0107 01/15/22 0129 01/15/22 0312 01/15/22 0818  BP: (!) 149/81 (!) 146/86 131/72 123/73  Pulse: 62 (!) 59 (!) 58 (!) 58  Resp: 18 18 17 18   Temp: 98 F (36.7 C) 97.6 F (36.4 C) 98.3 F (36.8 C) 98.4 F (36.9 C)  TempSrc: Oral Oral    SpO2: 96% 100% 97% 100%  Weight:  77.5 kg    Height:  6\' 1"  (1.854 m)     CBC:  Recent Labs  Lab 01/14/22 1524  WBC 5.1  HGB 14.4  HCT 43.4  MCV 95.4  PLT 203   Basic Metabolic Panel:  Recent Labs  Lab 01/14/22 1524  NA 138  K 4.2  CL 104  CO2 25  GLUCOSE 124*  BUN 25*  CREATININE 0.88  CALCIUM 9.7   Lipid Panel: No results for input(s): "CHOL", "TRIG", "HDL", "CHOLHDL", "VLDL", "LDLCALC" in the last 168 hours. HgbA1c: No results for input(s): "HGBA1C" in the last 168 hours. Urine Drug Screen: No results for input(s): "LABOPIA", "COCAINSCRNUR", "LABBENZ", "AMPHETMU", "THCU", "LABBARB" in the last 168 hours.  Alcohol Level No results for input(s): "ETH" in the last 168 hours.  IMAGING past 24 hours MR BRAIN WO CONTRAST  Result Date: 01/14/2022 CLINICAL DATA:  Acute neuro deficit with right-sided weakness EXAM: MRI HEAD WITHOUT CONTRAST TECHNIQUE: Multiplanar, multiecho pulse sequences of the brain and surrounding structures were obtained without intravenous contrast. COMPARISON:  MRI head 06/16/2021, CT head 01/10/2022 FINDINGS: Brain: 10 mm cute infarct left pons.  No other acute infarct Generalized atrophy. Mild chronic microvascular ischemic change in the white matter. Negative for hemorrhage or mass. Mild ventricular prominence is stable. Vascular: Normal arterial flow voids at the skull base. Skull and upper cervical spine: No focal skeletal lesion. Degenerative change and pannus at  C1-2. Sinuses/Orbits: Mild mucosal edema paranasal sinuses. Mild left mastoid effusion. Bilateral cataract extraction Other: None IMPRESSION: 1. 1 cm acute infarct left pons 2. Atrophy and mild chronic microvascular ischemic change in the white matter. Electronically Signed   By: 06/18/2021 M.D.   On: 01/14/2022 18:20    PHYSICAL EXAM  Physical Exam  Constitutional: Appears well-developed and well-nourished.   Cardiovascular: Normal rate and regular rhythm.  Respiratory: Effort normal, non-labored breathing  Neuro: Mental Status: Patient is awake, alert, oriented to person, place, month, year, and situation. Patient is able to give a clear and coherent history. No signs of aphasia or neglect Cranial Nerves: II: Visual Fields are full. Pupils are equal, round, and reactive to light.   III,IV, VI: EOMI without ptosis or diploplia.  V: Facial sensation is symmetric to temperature VII: Facial movement is symmetric resting and smiling VIII: Hearing is intact to voice X: Palate elevates symmetrically XI: Shoulder shrug is symmetric. XII: Tongue protrudes midline without atrophy or fasciculations.  Motor: Tone is normal. Bulk is normal.  RUE 5/5 LUE 5/5 RLE 4+/5 LLE 4+/5 Sensory: Sensation is symmetric to light touch and temperature in the arms and legs. No extinction to DSS present.  Plantars: Toes are downgoing bilaterally.  Cerebellar: FNF intact bilaterally HKS ataxia right Dysmetria with left leg RAM slowed on the right hand, dexterity is impaired    ASSESSMENT/PLAN Charles Hale is a  81 y.o. 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 MRI performed in the emergency  department showed a pontine infarct for which neurological consultation was obtained. Patient does not report any focal weakness-reports weakness in bilateral legs but worse on the right leg with the right knee giving out while walking, which technically is not a new problem and is related to his arthritis but over the past few days had much more difficulty with gait than prior.  Family also noticed slurred speech which has somewhat improved since Sunday but is still not back at baseline. Denies chest pain shortness of breath palpitations.  Denies any acute illnesses or sicknesses preceding this. Reports that he was on aspirin and Plavix for 3 weeks after the last stroke and was in rehab for a while and currently only on aspirin. He saw his primary care doctor yesterday and was recommended to be on dual antiplatelets by them. CTA pending, will resume dapt therapy for 3 months and then plavix 75mg  alone. 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  Continue statin at discharge  Other Stroke Risk Factors Advanced Age >/= 33   Other Active Problems Anxiety and depression- continue home medications per primary team Chronic back  pain- uses Aleve at home  Hospital day # 0  Patient seen and examined by NP/APP with MD. MD to update note as needed.   , DNP, FNP-BC [facility] Pager: [phone]  ATTENDING NOTE: I reviewed above note and agree with the assessment and plan. Pt was seen and examined.   81 year old male with history of hypertension, hyperlipidemia, LBP, stroke in 05/2021 admitted for slurred speech, right-sided weakness and difficulty walking.  MRI showed left pontine infarct.  Patient refused CTA head and neck.  EF 60 to 65%.  LDL 103, A1c pending.  Creatinine 0.88.  Patient had a stroke in 05/2021 for left-sided weakness and fell.  MRI showed right medullary infarct.  CT head and neck showed left V4 atherosclerosis.  Right VA ends at PICA.  Bilateral P1/P2, left A2 stenosis, right ICA 50 to 60% and left ICA 40 to 50% stenosis.  LDL 96, A1c 5.5.  UDS negative.  EF 60 to 65%.  He was discharged on DAPT for 3 weeks and Lipitor 40.  PTA, patient on aspirin only.  Not taking Lipitor or Zetia at home due to joint/muscle pain.  On exam, patient neurologically intact except mild dysarthria, right hand mild  dexterity difficulty, bilateral lower extremity ataxia right more than left.  Etiology for patient stroke again likely due to small vessel disease.  Patient wishes to take aspirin 81 and Plavix 75 DAPT for 3 months and then Plavix alone, we agreed.  Put on Crestor 20, continue on discharge.  PT/OT recommend home health.  Stroke risk factor modification.  Patient will follow up in 4 weeks with [facility].  For detailed assessment and plan, please refer to above/below as I have made changes wherever appropriate.   Neurology will sign off. Please call with questions. Pt will follow up with stroke clinic NP at [facility] in about 4 weeks. Thanks for the consult.   Marvel Plan, MD PhD Stroke Neurology 01/15/2022 5:24 PM    To contact Stroke Continuity provider, please refer to [email or web address]. After  hours, contact General Neurology

## 2022-01-15 NOTE — TOC Transition Note (Addendum)
Transition of Care [facility]) - CM/SW Discharge Note   Patient Details  Name: Charles Hale MRN: [id number] Date of Birth: [birth year]  [facility]) CM/SW Contact:  Bess Kinds, RN Phone Number: [phone] 01/15/2022, 11:59 AM   Clinical Narrative:     Notified by PT of recommendations for [facility]. Patient agreeable. Offered choice of HH agency. Referral in process.   Patient stated that his daughter, Verlon Au, will be transporting him home.   Update: Referral accepted by [facility] for PT. Patient will need HH/Face to Face orders.   Final next level of care: Home w Home Health Services Barriers to Discharge: No Barriers Identified   Patient Goals and CMS Choice Patient states their goals for this hospitalization and ongoing recovery are:: return home CMS Medicare.gov Compare Post Acute Care list provided to:: Patient Choice offered to / list presented to : Patient  Discharge Placement                       Discharge Plan and Services                DME Arranged: N/A DME Agency: NA       HH Arranged: PT          Social Determinants of Health (SDOH) Interventions     Readmission Risk Interventions     No data to display

## 2022-01-15 NOTE — H&P (Signed)
History and Physical    Patient: Charles Hale ZOX:[id number] DOB: 06-Jun-1941 DOA: 01/14/2022 DOS: the patient was seen and examined on 01/15/2022 PCP: Doreen Salvage, PA-C  Patient coming from: Home  Chief Complaint:  Chief Complaint  Patient presents with   Weakness   HPI: Charles Hale is a 81 y.o. male with medical history significant of History of CVA in 05/2021, hypertension who presents with right-sided weakness for the past several days.  Patient provides limited history since he was very frustrated about his admission in is refusing any further imaging.  Reports that he recently had a scan of his neck and does not want echocardiogram done since there was nothing "wrong with his heart."  Per documentation, he was evaluated in the ED [facility] on [facility]/25 with complaints of motor weakness in all 4 extremities.  Reports taking double dose of his Xanax the night before.  He had CT cervical spine without contrast showing severe multilevel cervical disc degenerative disease but no acute fracture.  Had negative CT head.  In the ED, MRI brain without contrast was revealing for 1 cm acute infarct the left pons.  He reports taking aspirin daily and recently had Plavix prescribed by PCP several days ago.  CBC and BMP largely unremarkable.  EKG with normal sinus rhythm.  He was evaluated by neurology Dr. Wilford Corner who recommended admission for full stroke workup.   After further discussion with patient he continues to refuse CTA imaging and might consider echocardiogram in the morning.  He is willing to stay for observation for PT eval in the morning.  Review of Systems: As mentioned in the history of present illness. All other systems reviewed and are negative. Past Medical History:  Diagnosis Date   Anxiety    Arthritis    Hypertension    Spinal stenosis    Stroke [facility]) 05/2021   Past Surgical History:  Procedure Laterality Date   SHOULDER ARTHROSCOPY     Social  History:  reports that he has never smoked. He has never been exposed to tobacco smoke. He has never used smokeless tobacco. He reports current alcohol use. He reports that he does not use drugs.  Allergies  Allergen Reactions   Codeine Nausea Only   Penicillins Hives   Terbinafine Hcl Hives and Other (See Comments)    "Hives for 2 weeks"    No family history on file.  Prior to Admission medications   Medication Sig Start Date End Date Taking? Authorizing Provider  alprazolam Prudy Feeler) 2 MG tablet Take 0.5 tablets (1 mg total) by mouth See admin instructions. Take 1 mg by mouth at bedtime and additional 1 mg three to four hours later Patient not taking: Reported on 08/28/2021 06/19/21   Leatha Gilding, MD  alprazolam Prudy Feeler) 2 MG tablet Take 1 tablet (2 mg total) by mouth at bedtime as needed for sleep 06/09/21     alprazolam Prudy Feeler) 2 MG tablet Take 1 tablet by mouth at bedtime as needed for sleeplessness (10/12/21) 10/04/21     amLODipine (NORVASC) 10 MG tablet Take 10 mg by mouth at bedtime.    [provider]  amLODipine (NORVASC) 10 MG tablet Take 1 tablet (10 mg total) by mouth daily. Patient not taking: Reported on 08/28/2021 08/07/21     aspirin EC 81 MG EC tablet Take 1 tablet (81 mg total) by mouth daily. Swallow whole. 06/20/21   Leatha Gilding, MD  atorvastatin (LIPITOR) 40 MG tablet Take  1 tablet (40 mg total) by mouth daily. Patient not taking: Reported on 08/28/2021 06/20/21   Caren Griffins, MD  atorvastatin (LIPITOR) 40 MG tablet Take 1 tablet (40 mg total) by mouth daily to lower cholesterol and reduce chance of stroke 07/28/21     clopidogrel (PLAVIX) 75 MG tablet Take 1 tablet by mouth daily. 01/12/22     Cyanocobalamin (VITAMIN B-12 PO) Take 1 tablet by mouth daily.    [provider]  ezetimibe (ZETIA) 10 MG tablet Take 1 tablet by mouth daily 10/04/21     gabapentin (NEURONTIN) 100 MG capsule Take 1 capsule (100 mg total) by mouth 3 (three) times daily.  08/07/21     losartan (COZAAR) 100 MG tablet Take 1 tablet by mouth daily. 01/12/22     meloxicam (MOBIC) 15 MG tablet Take 15 mg by mouth in the morning.    [provider]  Multiple Vitamins-Minerals (ONE-A-DAY MENS 50+) TABS Take 1 tablet by mouth daily.    [provider]  omeprazole (PRILOSEC) 20 MG capsule Take 1 capsule by mouth daily for acid reflux and to prevent ulcer formation 12/14/21     sertraline (ZOLOFT) 100 MG tablet Take 100 mg by mouth at bedtime.    [provider]  sertraline (ZOLOFT) 100 MG tablet Take 1 tablet (100 mg total) by mouth daily. Patient not taking: Reported on 08/28/2021 05/26/21     telmisartan (MICARDIS) 40 MG tablet Take 1 tablet by mouth daily for blood pressure control * Stop amlodipine** 11/15/21     traMADol (ULTRAM) 50 MG tablet Take 1 to 2 tablets by mouth three times daily as needed for back pain 11/15/21     VITAMIN E PO Take 1 capsule by mouth daily.    [provider]    Physical Exam: Vitals:   01/14/22 2045 01/14/22 2100 01/14/22 2115 01/14/22 2130  BP: 132/78 113/78 121/77 128/82  Pulse: 75 72 78 86  Resp: 17 16 (!) 21 15  Temp:      TempSrc:      SpO2: 98% 98% 96% 96%  Weight:      Height:       Constitutional: NAD, frustrated and irritable but laying comfortably upright in bed Eyes: lids and conjunctivae normal ENMT: Mucous membranes are moist.  Neck: normal, supple Respiratory: clear to auscultation bilaterally, no wheezing, no crackles. Normal respiratory effort. No accessory muscle use.  Cardiovascular: Regular rate and rhythm, no murmurs / rubs / gallops. No extremity edema. 2+ pedal pulses.  Abdomen: no tenderness,  Bowel sounds positive.  Musculoskeletal: no clubbing / cyanosis. No joint deformity upper and lower extremities. Good ROM, no contractures. Normal muscle tone.  Skin: no rashes, lesions, ulcers. No induration Neurologic: CN 2-12 grossly intact. Strength 5/5 in all 4.  Intact  finger-nose. Psychiatric: Normal judgment and insight. Alert and oriented x 3.  Irritable and frustrated mood.    Data Reviewed:  See HPI  Assessment and Plan: * Stroke [facility]) - MRI brain revealing 1 cm acute infarct left pons.  Patient refuses CTA head and neck stating he has received another CT scan at outside ED.  On chart review, he only received a CT cervical spine several days ago. -also refusing Echocardiogram but have ordered in case he changes his mind -Continue aspirin 325+ Plavix 75 mg for 3 months - Continue high intensity statin -Obtain A1c and lipids -PT/OT/SLT -Frequent neuro checks and keep on telemetry -No need for permissive hypertension per neuro.  BP goal of 140/90.  Anxiety Continue Xanax  Depression Continue sertraline  Essential hypertension Continue losartan      Advance Care Planning:   Code Status: Full Code  Consults: Neurology  Family Communication: None at bedside  Severity of Illness: The appropriate patient status for this patient is OBSERVATION. Observation status is judged to be reasonable and necessary in order to provide the required intensity of service to ensure the patient's safety. The patient's presenting symptoms, physical exam findings, and initial radiographic and laboratory data in the context of their medical condition is felt to place them at decreased risk for further clinical deterioration. Furthermore, it is anticipated that the patient will be medically stable for discharge from the hospital within 2 midnights of admission.   Author: Anselm Jungling, DO 01/15/2022 12:57 AM  For on call review [email or web address].

## 2022-01-15 NOTE — Progress Notes (Signed)
  Echocardiogram 2D Echocardiogram has been performed.  Augustine Radar 01/15/2022, 10:28 AM

## 2022-01-15 NOTE — Assessment & Plan Note (Signed)
Continue sertraline 

## 2022-01-15 NOTE — Progress Notes (Addendum)
Brought patient's home medicines at the pharmacy. Sealed the plastic bag containing all medicines in front of the patient. Form was signed by the patient. Placed chart's copy in the binder.

## 2022-01-15 NOTE — Evaluation (Signed)
Speech Language Pathology Evaluation Patient Details Name: Charles Hale MRN: [id number] DOB: [birth year] Today's Date: 01/15/2022 Time: 1147-1202 SLP Time Calculation (min) (ACUTE ONLY): 15 min  Problem List:  Patient Active Problem List   Diagnosis Date Noted   Anxiety 01/15/2022   Stroke (HCC) 01/14/2022   Cerebral thrombosis with cerebral infarction 06/17/2021   Essential hypertension 06/16/2021   Depression 06/16/2021   Osteoarthritis 06/16/2021   Insomnia 06/16/2021   Acute left-sided weakness 06/15/2021   Past Medical History:  Past Medical History:  Diagnosis Date   Anxiety    Arthritis    Hypertension    Spinal stenosis    Stroke [facility]) 05/2021   Past Surgical History:  Past Surgical History:  Procedure Laterality Date   SHOULDER ARTHROSCOPY     HPI:  Charles Hale is a 81 y.o. male who presents with right-sided weakness and dysarthria. MRI revealed 1 cm acute infarct in left pons.  Prior medical history significant for right medullary CVA in 05/2021, hypertension.   Assessment / Plan / Recommendation Clinical Impression  Pt presents with a mild dysarthria of speech that has improved since admission.  Speech is fully intelligible but marked by imprecision of consonants.  Expressive, receptive language, attention, problem-solving and memory are [facility].  Pt able to copy figures and complete a clock drawing functionally.  If dysarthria persists after the next 48 hours, he may benefit from short-term speech therapy.  No further acute care SLP needs identified.  Our service will sign off.    SLP Assessment  SLP Recommendation/Assessment: All further Speech Lanaguage Pathology  needs can be addressed in the next venue of care SLP Visit Diagnosis: Dysarthria and anarthria (R47.1)    Recommendations for follow up therapy are one component of a multi-disciplinary discharge planning process, led by the attending physician.  Recommendations may be updated based on patient status,  additional functional criteria and insurance authorization.    Follow Up Recommendations  Home health SLP    Assistance Recommended at Discharge  Frequent or constant Supervision/Assistance  Functional Status Assessment Patient has had a recent decline in their functional status and demonstrates the ability to make significant improvements in function in a reasonable and predictable amount of time.  Frequency and Duration           SLP Evaluation Cognition  Overall Cognitive Status: Within Functional Limits for tasks assessed Orientation Level: Oriented X4 Attention: Selective Selective Attention: Appears intact Memory: Appears intact Awareness: Appears intact Problem Solving: Appears intact Safety/Judgment: Appears intact       Comprehension  Auditory Comprehension Overall Auditory Comprehension: Appears within functional limits for tasks assessed Visual Recognition/Discrimination Discrimination: Within Function Limits Reading Comprehension Reading Status: Within funtional limits    Expression Expression Primary Mode of Expression: Verbal Verbal Expression Overall Verbal Expression: Appears within functional limits for tasks assessed Written Expression Dominant Hand: Right   Oral / Motor  Oral Motor/Sensory Function Overall Oral Motor/Sensory Function: Within functional limits Motor Speech Overall Motor Speech: Impaired Respiration: Within functional limits Phonation: Normal Resonance: Within functional limits Articulation: Impaired Level of Impairment: IT consultant Planning: Witnin functional limits            Blenda Mounts Laurice 01/15/2022, 1:55 PM [place] L. Samson Frederic, MA CCC/SLP Clinical Specialist - Acute Care SLP Acute Rehabilitation Services Office number [phone]

## 2022-01-15 NOTE — Assessment & Plan Note (Signed)
Continue losartan. 

## 2022-01-15 NOTE — Progress Notes (Signed)
Arrived patient in the unit per bed with RN alert and oriented X4. Oriented to room set up. Call light within reach. Bed at lowest position. Cardiac tele connected to CCMD.

## 2022-01-15 NOTE — Evaluation (Signed)
Physical Therapy Evaluation Patient Details Name: Charles Hale MRN: [id number] DOB: 06-11-1941 Today's Date: 01/15/2022  History of Present Illness  81 yo male with onset of generalized and R side weakness was admitted on 6/29 for consistent R side weakness that made walking difficult and speech was slurred.  Pt has been resistant about getting all his testing done with MD's, reporting it was repetitive. MRI of brain shows L pons infarct,  PMHx:  L hemiparesis, anxiety, OA R knee, HTN, spinal stenosis, stroke, HTN,  Clinical Impression  Pt was seen for mobility assessment, and had previously been seen by this PT.  Pt reports he is home with daughter to help him and will not be living alone.  Has equipment and help to manage, but will anticipate HHPT to be needed, along with outpatient therapy when [facility] is completed.  Pt had recently been in therapy, had completed it per his report to be ambulatory on [facility].  Now requires RW, has this at home and other necessary things.  Recommending for pt to be supported by family at home as he reports, and will follow acutely as his stay requires for further tx.       Recommendations for follow up therapy are one component of a multi-disciplinary discharge planning process, led by the attending physician.  Recommendations may be updated based on patient status, additional functional criteria and insurance authorization.  Follow Up Recommendations Home health PT      Assistance Recommended at Discharge Frequent or constant Supervision/Assistance  Patient can return home with the following  A little help with walking and/or transfers;A little help with bathing/dressing/bathroom;Assistance with cooking/housework;Assist for transportation;Help with stairs or ramp for entrance    Equipment Recommendations None recommended by PT  Recommendations for Other Services       Functional Status Assessment Patient has had a recent decline in their functional status and  demonstrates the ability to make significant improvements in function in a reasonable and predictable amount of time.     Precautions / Restrictions Precautions Precautions: Fall Precaution Comments: R knee OA Restrictions Weight Bearing Restrictions: No      Mobility  Bed Mobility               General bed mobility comments: up in chair when PT arrived    Transfers Overall transfer level: Needs assistance Equipment used: Rolling walker (2 wheels) Transfers: Sit to/from Stand Sit to Stand: Min guard           General transfer comment: min guard for safety    Ambulation/Gait Ambulation/Gait assistance: Min guard Gait Distance (Feet): 100 Feet Assistive device: Rolling walker (2 wheels), 1 person hand held assist Gait Pattern/deviations: Step-through pattern, Decreased stride length Gait velocity: reduced Gait velocity interpretation: <1.31 ft/sec, indicative of household ambulator Pre-gait activities: standing balance ck General Gait Details: pt is given cue for controlling L ankle with good follow through and good lateral stability on walker.  Without walker is unsteady on his feet  Stairs            Wheelchair Mobility    Modified Rankin (Stroke Patients Only) Modified Rankin (Stroke Patients Only) Pre-Morbid Rankin Score: Slight disability Modified Rankin: Moderately severe disability     Balance Overall balance assessment: Needs assistance Sitting-balance support: Feet supported Sitting balance-Leahy Scale: Good     Standing balance support: Bilateral upper extremity supported, During functional activity Standing balance-Leahy Scale: Fair Standing balance comment: dynamic balance is poor+ and requires walker  Pertinent Vitals/Pain Pain Assessment Pain Assessment: No/denies pain    Home Living Family/patient expects to be discharged to:: Private residence Living Arrangements: Alone Available Help  at Discharge: Family (daughter is there per pt) Type of Home: House Home Access: Stairs to enter Entrance Stairs-Rails: None Entrance Stairs-Number of Steps: 2   Home Layout: One level Home Equipment: Grab bars - tub/shower;Cane - single Librarian, academic (2 wheels);Shower seat;Toilet riser      Prior Function Prior Level of Function : Needs assist       Physical Assist : Mobility (physical) Mobility (physical): Gait   Mobility Comments: previously was on [facility] then this incident required RW ADLs Comments: indep     Hand Dominance   Dominant Hand: Right    Extremity/Trunk Assessment   Upper Extremity Assessment Upper Extremity Assessment: Generalized weakness (clear BUE mild incoordination but can don his shoes and leg brace)    Lower Extremity Assessment Lower Extremity Assessment: Generalized weakness;RLE deficits/detail;LLE deficits/detail RLE Deficits / Details: 4+ strength on RLE RLE Coordination: decreased gross motor LLE Deficits / Details: 4 to 4+ strength LLE LLE Coordination: decreased gross motor    Cervical / Trunk Assessment Cervical / Trunk Assessment: Other exceptions (spinal stenosis)  Communication   Communication: Expressive difficulties (mild dysarthria)  Cognition Arousal/Alertness: Awake/alert Behavior During Therapy: WFL for tasks assessed/performed Overall Cognitive Status: Within Functional Limits for tasks assessed                                 General Comments: pt is residually weak from L side hemiparesis and now is following instructions to work on ankle instability with good follow up questions        General Comments General comments (skin integrity, edema, etc.): Pt is unsteady without a RW dynamically but was at supervision level with brace on L ankle and cues for stability.  Recommended to have help at home and pt reports his daughter is at home with him    Exercises     Assessment/Plan    PT Assessment  Patient needs continued PT services  PT Problem List Decreased strength;Decreased balance;Decreased coordination;Decreased knowledge of use of DME       PT Treatment Interventions DME instruction;Gait training;Stair training;Functional mobility training;Therapeutic activities;Therapeutic exercise;Balance training;Neuromuscular re-education;Patient/family education    PT Goals (Current goals can be found in the Care Plan section)  Acute Rehab PT Goals Patient Stated Goal: to go home with daughter's help PT Goal Formulation: With patient Time For Goal Achievement: 01/22/22 Potential to Achieve Goals: Good    Frequency Min 4X/week     Co-evaluation               AM-PAC PT "6 Clicks" Mobility  Outcome Measure Help needed turning from your back to your side while in a flat bed without using bedrails?: None Help needed moving from lying on your back to sitting on the side of a flat bed without using bedrails?: None Help needed moving to and from a bed to a chair (including a wheelchair)?: A Little Help needed standing up from a chair using your arms (e.g., wheelchair or bedside chair)?: A Little Help needed to walk in hospital room?: A Little Help needed climbing 3-5 steps with a railing? : A Lot 6 Click Score: 19    End of Session Equipment Utilized During Treatment: Gait belt Activity Tolerance: Patient tolerated treatment well Patient left: in chair;with call bell/phone within reach  Nurse Communication: Mobility status PT Visit Diagnosis: Unsteadiness on feet (R26.81);Muscle weakness (generalized) (M62.81);Ataxic gait (R26.0);Difficulty in walking, not elsewhere classified (R26.2);Hemiplegia and hemiparesis Hemiplegia - Right/Left: Left Hemiplegia - dominant/non-dominant: Non-dominant Hemiplegia - caused by: Unspecified    Time: 8185-6314 PT Time Calculation (min) (ACUTE ONLY): 27 min   Charges:   PT Evaluation $PT Eval Moderate Complexity: 1 Mod PT Treatments $Gait  Training: 8-22 mins       Ivar Drape 01/15/2022, 1:25 PM  Samul Dada, PT PhD Acute Rehab Dept. Number: [facility] [id number] and [facility] [phone]

## 2022-01-15 NOTE — Progress Notes (Signed)
As per ED RN Armond Hang, patient took his home medicines yesterday but they have educated him on not taking his home medicines for now because the hospital will provide while he is admitted. Per patient, he took 1 mg of Alprazolam at 2300 and wants to take the other 1 mg now. Patient verbalized understanding about education on his medicines.

## 2022-01-16 ENCOUNTER — Other Ambulatory Visit (HOSPITAL_COMMUNITY): Payer: Self-pay

## 2022-01-16 LAB — HEMOGLOBIN A1C
Hgb A1c MFr Bld: 5.4 % (ref 4.8–5.6)
Mean Plasma Glucose: 108 mg/dL

## 2022-01-18 ENCOUNTER — Encounter: Payer: PPO | Admitting: Physical Therapy

## 2022-01-21 ENCOUNTER — Encounter: Payer: PPO | Admitting: Physical Therapy

## 2022-02-06 ENCOUNTER — Other Ambulatory Visit (HOSPITAL_COMMUNITY): Payer: Self-pay

## 2022-02-06 MED ORDER — TRAMADOL HCL 50 MG PO TABS
100.0000 mg | ORAL_TABLET | Freq: Three times a day (TID) | ORAL | 1 refills | Status: DC | PRN
Start: 2022-02-05 — End: 2022-03-09
  Filled 2022-02-06: qty 180, 30d supply, fill #0

## 2022-02-08 ENCOUNTER — Other Ambulatory Visit (HOSPITAL_COMMUNITY): Payer: Self-pay

## 2022-02-08 MED ORDER — ALPRAZOLAM 2 MG PO TABS
2.0000 mg | ORAL_TABLET | Freq: Every day | ORAL | 3 refills | Status: DC
  Filled 2022-02-10: qty 30, 30d supply, fill #0
  Filled 2022-03-09 – 2022-03-13 (×2): qty 30, 30d supply, fill #1
  Filled 2022-04-08 – 2022-04-09 (×2): qty 30, 30d supply, fill #2
  Filled 2022-05-06: qty 30, 30d supply, fill #3

## 2022-02-10 ENCOUNTER — Other Ambulatory Visit (HOSPITAL_COMMUNITY): Payer: Self-pay

## 2022-02-11 ENCOUNTER — Other Ambulatory Visit (HOSPITAL_COMMUNITY): Payer: Self-pay

## 2022-02-11 MED ORDER — SERTRALINE HCL 100 MG PO TABS
ORAL_TABLET | ORAL | 1 refills | Status: DC
  Filled 2022-02-11 – 2022-02-19 (×2): qty 180, 90d supply, fill #0
  Filled 2022-06-01: qty 180, 90d supply, fill #1

## 2022-02-11 MED ORDER — HYDROXYZINE HCL 10 MG PO TABS
ORAL_TABLET | ORAL | 2 refills | Status: DC
  Filled 2022-02-11: qty 45, 22d supply, fill #0

## 2022-02-11 MED ORDER — MUPIROCIN 2 % EX OINT
TOPICAL_OINTMENT | CUTANEOUS | 0 refills | Status: DC
  Filled 2022-02-11: qty 22, 15d supply, fill #0

## 2022-02-15 ENCOUNTER — Other Ambulatory Visit (HOSPITAL_COMMUNITY): Payer: Self-pay

## 2022-02-19 ENCOUNTER — Other Ambulatory Visit (HOSPITAL_COMMUNITY): Payer: Self-pay

## 2022-02-25 ENCOUNTER — Other Ambulatory Visit (HOSPITAL_COMMUNITY): Payer: Self-pay

## 2022-02-25 MED ORDER — TIZANIDINE HCL 2 MG PO TABS
ORAL_TABLET | ORAL | 3 refills | Status: DC
  Filled 2022-02-25: qty 60, 30d supply, fill #0

## 2022-03-03 ENCOUNTER — Other Ambulatory Visit (HOSPITAL_COMMUNITY): Payer: Self-pay

## 2022-03-09 ENCOUNTER — Ambulatory Visit: Payer: PPO | Admitting: Family Medicine

## 2022-03-09 ENCOUNTER — Encounter: Payer: Self-pay | Admitting: Family Medicine

## 2022-03-09 ENCOUNTER — Other Ambulatory Visit (HOSPITAL_COMMUNITY): Payer: Self-pay

## 2022-03-09 VITALS — BP 164/84 | HR 69 | Ht 73.0 in | Wt 172.0 lb

## 2022-03-09 DIAGNOSIS — I6302 Cerebral infarction due to thrombosis of basilar artery: Secondary | ICD-10-CM | POA: Diagnosis not present

## 2022-03-09 NOTE — Progress Notes (Signed)
[facility] [address] [place]. [place] [address] [phone]       HOSPITAL FOLLOW UP NOTE  [name] Date of Birth:  [birth year] Medical Record Number:  [id number]   Reason for Referral:  hospital stroke follow up   SUBJECTIVE:   CHIEF COMPLAINT:  No chief complaint on file.   HPI:   Charles Hale is a 81 y.o. who  has a past medical history of Anxiety, Arthritis, Hypertension, Spinal stenosis, and Stroke (HCC) (05/2021).  Patient presented to [facility] on 01/10/2022 with generalized muscle weakness of all four ext upon waking that morning. He had taken double dose of alprazolam (4mg ) the night before. CT head was negative. CT cervical spine showed severe DDD. MRI was offered but patient declined and he was discharged home. He returned to [facility] ER 01/14/2022 with reports of right sided weakness for "several days".  History was limited as patient was very upset about admission to the hospital. He initially refused imaging but did, eventually, agree to an MRI and Echo. MRI showed 1cm acute infarct in the left pons. ASA and Plavix x 3 months then Plavix alone. He was started on Crestor. Home PT/OT recommended and he was discharged home 6/30. Personally reviewed hospitalization pertinent progress notes, lab work and imaging.  Evaluated by Dr 7/30.   Since discharge, he is doing well. He continues to feel right side if not as strong. He is using a walker. No falls. He reports having a referral to [facility] placed by PCP but has not heard back to schedule. Previously worked with [facility] following CVA in 05/2021 (formerly [facility]).   He is followed by 06/2021, PCP, at [facility]. He has chronic back pain. He reports not starting rosuvastatin. He had muscle pain with atorvastatin. He has continued Plavix but admits that he is not taking daily, mostly EOD. He lives alone. He does not drive. He does not smoke. Quit drinking about 1 year ago.      PERTINENT IMAGING/LABS  MRI  1cm acute infarct left pons CTA head and neck - pt refused 2D Echo EF 60-65%   A1C Lab Results  Component Value Date   HGBA1C 5.4 01/15/2022    Lipid Panel     Component Value Date/Time   CHOL 181 01/15/2022 0906   TRIG 139 01/15/2022 0906   HDL 50 01/15/2022 0906   CHOLHDL 3.6 01/15/2022 0906   VLDL 28 01/15/2022 0906   LDLCALC 103 (H) 01/15/2022 0906      ROS:   14 system review of systems performed and negative with exception of those listed in HPI  PMH:  Past Medical History:  Diagnosis Date   Anxiety    Arthritis    Hypertension    Spinal stenosis    Stroke (HCC) 05/2021    PSH:  Past Surgical History:  Procedure Laterality Date   SHOULDER ARTHROSCOPY      Social History:  Social History   Socioeconomic History   Marital status: Widowed    Spouse name: Not on file   Number of children: Not on file   Years of education: Not on file   Highest education level: Not on file  Occupational History   Not on file  Tobacco Use   Smoking status: Never    Passive exposure: Never   Smokeless tobacco: Never  Substance and Sexual Activity   Alcohol use: Yes    Comment: occ   Drug use: Never  Sexual activity: Not on file  Other Topics Concern   Not on file  Social History Narrative   Not on file   Social Determinants of Health   Financial Resource Strain: Not on file  Food Insecurity: Not on file  Transportation Needs: Not on file  Physical Activity: Not on file  Stress: Not on file  Social Connections: Not on file  Intimate Partner Violence: Not on file    Family History: No family history on file.  Medications:   Current Outpatient Medications on File Prior to Visit  Medication Sig Dispense Refill   alprazolam (XANAX) 2 MG tablet Take 1 tablet by mouth at bedtime as needed for sleeplessness 30 tablet 3   aspirin EC 81 MG EC tablet Take 1 tablet (81 mg total) by mouth daily. Swallow whole. 30 tablet 11    clopidogrel (PLAVIX) 75 MG tablet Take 1 tablet by mouth daily. 30 tablet 5   hydrOXYzine (ATARAX) 10 MG tablet Take 1 (one) tablet by mouth two times daily as needed for itching/rash 45 tablet 2   losartan (COZAAR) 100 MG tablet Take 1 tablet by mouth daily. 90 tablet 1   Multiple Vitamins-Minerals (ONE-A-DAY MENS 50+) TABS Take 1 tablet by mouth daily.     mupirocin ointment (BACTROBAN) 2 % Apply 1 application twice daily 22 g 0   rosuvastatin (CRESTOR) 20 MG tablet Take 1 tablet (20 mg total) by mouth daily. 30 tablet 2   sertraline (ZOLOFT) 100 MG tablet Take 1 tablet by mouth two times daily  **Increased from 1 tablet daily 02/11/2022 180 tablet 1   tiZANidine (ZANAFLEX) 2 MG tablet Take 1 (one) tablet by mouth two times daily, for muscle pain 60 tablet 3   traMADol (ULTRAM) 50 MG tablet Take 2 tablets (100 mg total) by mouth 3 (three) times daily as needed for back pain 180 tablet 1   VITAMIN E PO Take 1 capsule by mouth daily.     No current facility-administered medications on file prior to visit.    Allergies:   Allergies  Allergen Reactions   Codeine Nausea Only   Penicillins Hives   Terbinafine Hcl Hives and Other (See Comments)    "Hives for 2 weeks"      OBJECTIVE:  Physical Exam  There were no vitals filed for this visit. There is no height or weight on file to calculate BMI. No results found.      No data to display           General: well developed, well nourished, seated, in no evident distress Head: head normocephalic and atraumatic.   Neck: supple with no carotid or supraclavicular bruits Cardiovascular: regular rate and rhythm, no murmurs Musculoskeletal: no deformity Skin:  no rash/petichiae Vascular:  Normal pulses all extremities   Neurologic Exam Mental Status: Awake and fully alert.  Fluent speech and language.  Oriented to place and time. Recent and remote memory intact. Attention span, concentration and fund of knowledge appropriate. Mood  and affect appropriate.  Cranial Nerves: Fundoscopic exam reveals sharp disc margins. Pupils equal, briskly reactive to light. Extraocular movements full without nystagmus. Visual fields full to confrontation. Hearing intact. Facial sensation intact. Face, tongue, palate moves normally and symmetrically.  Motor: Normal bulk and tone. Normal strength in all tested extremity muscles with exception of 4+/5 right hip flexion Sensory.: intact to touch , pinprick , position and vibratory sensation.  Coordination: Rapid alternating movements normal in all extremities. Finger-to-nose and heel-to-shin performed accurately bilaterally.  Gait and Station: Arises from chair without difficulty. Stance is normal. Gait demonstrates normal stride length and stable with walker. Slight imbalance with turns. Unable to tandem.  Reflexes: 1+ and symmetric.    NIHSS  1 Modified Rankin  0    ASSESSMENT: TYRA MICHELLE is a 81 y.o. year old male presenting to ER . Vascular risk factors include HTN, HLD, advanced age, prior CVA. Marland Kitchen    PLAN:  Stroke:  Left pontine infarct  likely secondary small vessel disease source : Residual deficit: right sided weakness. Continue aspirin 81 mg daily and clopidogrel 75 mg daily  and rosuvastatin for secondary stroke prevention.  Discussed secondary stroke prevention measures and importance of close PCP follow up for aggressive stroke risk factor management. I have gone over the pathophysiology of stroke, warning signs and symptoms, risk factors and their management in some detail with instructions to go to the closest emergency room for symptoms of concern. HTN: BP goal <130/90. Continue to monitor closely with PCP. per PCP HLD: LDL goal <70. Recent LDL 103. Consider starting Crestor (rosuvastatin) 20mg  per PCP.  Right sided weakness: follow up with PCP, needs PT, will contact , PA.  Chronic back pain: continue close follow up with PCP. Consider referral to spine  specialist.    Follow up in 6 months or call earlier if needed   CC:  [facility] provider: Dr. Doreen Salvage PCP: Pearlean Brownie, PA-C    I spent 45 minutes of face-to-face and non-face-to-face time with patient.  This included previsit chart review including review of recent hospitalization, lab review, study review, order entry, electronic health record documentation, patient education regarding recent stroke including etiology, secondary stroke prevention measures and importance of managing stroke risk factors, residual deficits and typical recovery time and answered all other questions to patient satisfaction   Doreen Salvage, [facility]  [facility] [address] [place], [place]  Phone [phone] Fax [phone] Note: This document was prepared with digital dictation and possible smart phrase technology. Any transcriptional errors that result from this process are unintentional.

## 2022-03-09 NOTE — Patient Instructions (Signed)
Below is our plan:  Stroke:  Left pontine infarct  likely secondary small vessel disease source : Residual deficit: right sided weakness. Continue aspirin 81 mg daily and clopidogrel 75 mg daily and rosuvastatin for secondary stroke prevention. Stop aspirin 04/17/2022 and continue Plavix only.  Discussed secondary stroke prevention measures and importance of close PCP follow up for aggressive stroke risk factor management. I have gone over the pathophysiology of stroke, warning signs and symptoms, risk factors and their management in some detail with instructions to go to the closest emergency room for symptoms of concern. HTN: BP goal <130/90. Continue to monitor closely with PCP HLD: LDL goal <70. Recent LDL 103. Consider starting Crestor (rosuvastatin) 20mg  per PCP. May start with lower dose if you wish. Discuss with Charles Hale.  Right sided weakness: referral to rehab. Will discuss with PCP.  Chronic back pain: continue close follow up with PCP. Consider referral to spine specialist.   Please make sure you are staying well hydrated. I recommend 50-60 ounces daily. Well balanced diet and regular exercise encouraged. Consistent sleep schedule with 6-8 hours recommended.   Please continue follow up with care team as directed.   Follow up with me in 6 months   You may receive a survey regarding today's visit. I encourage you to leave honest feed back as I do use this information to improve patient care. Thank you for seeing me today!

## 2022-03-10 ENCOUNTER — Encounter: Payer: Self-pay | Admitting: Neurology

## 2022-03-10 ENCOUNTER — Telehealth: Payer: Self-pay | Admitting: Family Medicine

## 2022-03-10 ENCOUNTER — Encounter: Payer: Self-pay | Admitting: Family Medicine

## 2022-03-10 ENCOUNTER — Other Ambulatory Visit (HOSPITAL_COMMUNITY): Payer: Self-pay

## 2022-03-10 DIAGNOSIS — I6302 Cerebral infarction due to thrombosis of basilar artery: Secondary | ICD-10-CM

## 2022-03-10 NOTE — Telephone Encounter (Signed)
Sharla Kidney is calling and stated she is discharging Pt from Pt today. Pt is feeling week and unsteady and using his walker to walk today. Pt started taking Crestor last night and woke up feeling bad. Sharla Kidney is requesting nurse call the Pt back instead of her.

## 2022-03-10 NOTE — Telephone Encounter (Signed)
The PT from [facility] called back stating the patient having Left foot drop and needs Lt AFO brace order placed and sent to [facility] clinic. Their fax number is [phone]. She states order and recent ov notes must be sent over.   While I had her on the phone, I had her further clarify the previous message. The pt told amy he he had cholesterol medication and she encouraged him to restart it. He took a dose last night and woke up this morning felt increase weakness/unstable he is finding he is having to use walker more. She feels there is increase anxiety present which could also be causing hold up in sleep.

## 2022-03-11 ENCOUNTER — Other Ambulatory Visit (HOSPITAL_COMMUNITY): Payer: Self-pay

## 2022-03-11 MED ORDER — MUPIROCIN 2 % EX OINT
TOPICAL_OINTMENT | CUTANEOUS | 0 refills | Status: AC
  Filled 2022-03-11: qty 22, 10d supply, fill #0

## 2022-03-13 ENCOUNTER — Other Ambulatory Visit (HOSPITAL_COMMUNITY): Payer: Self-pay

## 2022-03-15 ENCOUNTER — Other Ambulatory Visit (HOSPITAL_COMMUNITY): Payer: Self-pay

## 2022-04-08 ENCOUNTER — Other Ambulatory Visit (HOSPITAL_COMMUNITY): Payer: Self-pay

## 2022-04-09 ENCOUNTER — Other Ambulatory Visit (HOSPITAL_COMMUNITY): Payer: Self-pay

## 2022-05-06 ENCOUNTER — Other Ambulatory Visit (HOSPITAL_COMMUNITY): Payer: Self-pay

## 2022-06-01 ENCOUNTER — Other Ambulatory Visit (HOSPITAL_COMMUNITY): Payer: Self-pay

## 2022-06-03 ENCOUNTER — Other Ambulatory Visit (HOSPITAL_COMMUNITY): Payer: Self-pay

## 2022-06-03 MED ORDER — ALPRAZOLAM 2 MG PO TABS
2.0000 mg | ORAL_TABLET | Freq: Every day | ORAL | 3 refills | Status: DC
  Filled 2022-06-04: qty 30, 30d supply, fill #0
  Filled 2022-07-01 – 2022-07-02 (×3): qty 30, 30d supply, fill #1
  Filled 2022-07-31: qty 30, 30d supply, fill #2
  Filled 2022-08-24 – 2022-08-30 (×3): qty 30, 30d supply, fill #3

## 2022-06-03 MED ORDER — TIZANIDINE HCL 2 MG PO TABS
2.0000 mg | ORAL_TABLET | Freq: Two times a day (BID) | ORAL | 3 refills | Status: AC
  Filled 2022-06-03: qty 60, 30d supply, fill #0
  Filled 2022-07-13: qty 60, 30d supply, fill #1
  Filled 2022-08-27: qty 60, 30d supply, fill #2

## 2022-06-04 ENCOUNTER — Other Ambulatory Visit (HOSPITAL_COMMUNITY): Payer: Self-pay

## 2022-07-01 ENCOUNTER — Other Ambulatory Visit (HOSPITAL_COMMUNITY): Payer: Self-pay

## 2022-07-01 ENCOUNTER — Other Ambulatory Visit: Payer: Self-pay

## 2022-07-02 ENCOUNTER — Other Ambulatory Visit (HOSPITAL_COMMUNITY): Payer: Self-pay

## 2022-07-02 ENCOUNTER — Other Ambulatory Visit: Payer: Self-pay

## 2022-07-14 ENCOUNTER — Other Ambulatory Visit: Payer: Self-pay

## 2022-07-22 ENCOUNTER — Other Ambulatory Visit (HOSPITAL_COMMUNITY): Payer: Self-pay

## 2022-07-22 MED ORDER — HYDROXYZINE HCL 10 MG PO TABS
10.0000 mg | ORAL_TABLET | Freq: Two times a day (BID) | ORAL | 2 refills | Status: AC | PRN
  Filled 2022-07-22 (×2): qty 45, 23d supply, fill #0

## 2022-07-28 ENCOUNTER — Other Ambulatory Visit (HOSPITAL_COMMUNITY): Payer: Self-pay

## 2022-08-02 ENCOUNTER — Other Ambulatory Visit: Payer: Self-pay

## 2022-08-02 ENCOUNTER — Other Ambulatory Visit (HOSPITAL_COMMUNITY): Payer: Self-pay

## 2022-08-04 ENCOUNTER — Other Ambulatory Visit: Payer: Self-pay

## 2022-08-04 ENCOUNTER — Other Ambulatory Visit (HOSPITAL_COMMUNITY): Payer: Self-pay

## 2022-08-06 ENCOUNTER — Other Ambulatory Visit: Payer: Self-pay

## 2022-08-06 ENCOUNTER — Other Ambulatory Visit (HOSPITAL_COMMUNITY): Payer: Self-pay

## 2022-08-06 MED ORDER — AMLODIPINE BESYLATE 10 MG PO TABS
10.0000 mg | ORAL_TABLET | Freq: Every day | ORAL | 0 refills | Status: DC
  Filled 2022-08-06: qty 90, 90d supply, fill #0

## 2022-08-24 ENCOUNTER — Other Ambulatory Visit (HOSPITAL_COMMUNITY): Payer: Self-pay

## 2022-08-24 MED ORDER — AMLODIPINE BESYLATE 10 MG PO TABS
10.0000 mg | ORAL_TABLET | Freq: Every day | ORAL | 0 refills | Status: AC
  Filled 2022-08-24: qty 90, 90d supply, fill #0

## 2022-08-25 ENCOUNTER — Other Ambulatory Visit (HOSPITAL_COMMUNITY): Payer: Self-pay

## 2022-08-28 ENCOUNTER — Other Ambulatory Visit (HOSPITAL_COMMUNITY): Payer: Self-pay

## 2022-08-30 ENCOUNTER — Other Ambulatory Visit: Payer: Self-pay

## 2022-08-30 ENCOUNTER — Other Ambulatory Visit (HOSPITAL_COMMUNITY): Payer: Self-pay

## 2022-08-30 MED ORDER — ALPRAZOLAM 2 MG PO TABS
2.0000 mg | ORAL_TABLET | Freq: Every day | ORAL | 3 refills | Status: DC
  Filled 2022-08-30 – 2022-09-27 (×3): qty 30, 30d supply, fill #0
  Filled 2022-10-24: qty 30, 30d supply, fill #1
  Filled 2022-11-22: qty 30, 30d supply, fill #2

## 2022-08-30 MED ORDER — BACLOFEN 10 MG PO TABS
10.0000 mg | ORAL_TABLET | Freq: Every evening | ORAL | 3 refills | Status: DC
  Filled 2022-08-30: qty 30, 30d supply, fill #0

## 2022-09-02 ENCOUNTER — Other Ambulatory Visit (HOSPITAL_COMMUNITY): Payer: Self-pay

## 2022-09-07 ENCOUNTER — Other Ambulatory Visit (HOSPITAL_COMMUNITY): Payer: Self-pay

## 2022-09-09 NOTE — Progress Notes (Signed)
[facility] [address] [place]. [place] [address] [phone]       HOSPITAL FOLLOW UP NOTE  [name] Date of Birth:  02-12-1941 Medical Record Number:  [id number]   Reason for Referral:  hospital stroke follow up   SUBJECTIVE:   CHIEF COMPLAINT:  Chief Complaint  Patient presents with   Follow-up    Pt here in room 1, in room with daughter, pt uses walker. Here for stoke follow up. Pt states muscles contractions in back, in PT. Pt reports legs hurts mainly.    HPI:   Charles Hale is an 82 y.o. who  has a past medical history of Anxiety, Arthritis, Histoplasmosis, Hypertension, Spinal stenosis, and Stroke ([place]) (05/2021).  Patient presented to [facility] on 01/10/2022 with generalized muscle weakness of all four ext upon waking that morning. He had taken double dose of alprazolam ('4mg'$ ) the night before. CT head was negative. CT cervical spine showed severe DDD. MRI was offered but patient declined and he was discharged home. He returned to [facility] ER 01/14/2022 with reports of right sided weakness for "several days".  History was limited as patient was very upset about admission to the hospital. He initially refused imaging but did, eventually, agree to an MRI and Echo. MRI showed 1cm acute infarct in the left pons. ASA and Plavix x 3 months then Plavix alone. He was started on Crestor. Home PT/OT recommended and he was discharged home 6/30. Personally reviewed hospitalization pertinent progress notes, lab work and imaging.  Evaluated by Dr Erlinda Hong.   Since discharge, he is doing well. He continues to feel right side if not as strong. He is using a walker. No falls. He reports having a referral to [facility] placed by PCP but has not heard back to schedule. Previously worked with [facility] [place] in 05/2021 (formerly [facility]).   He is followed by Charles Hale, PCP, at [facility]. He has chronic back pain. He reports not starting  rosuvastatin. He had muscle pain with atorvastatin. He has continued Plavix but admits that he is not taking daily, mostly EOD. He lives alone. He does not drive. He does not smoke. Quit drinking about 1 year ago.   UPDATE 09/13/2022 ALL: Charles Hale returns for follow up post CVA in 12/2021. He reports imbalance continues to be his most disabling symptom. He continues to work with PT twice weekly. He has an evaluation tomorrow to determine need to continue. He feels he is getting a little stronger. Continues to have slight weakness of right side. He continues to use walker. He has a hard time standing for any extended amount of time. He continues to ride stationary bike daily for about 20 minutes. He does use free weights for upper body training. He is having more muscle cramps, specifically at night. Baclofen did not help and made back pain worse. Tizanidine has helped in past but has not taken it recently.   He discontinued Plavix due to side effects but he can't remember which ones. His daughter feels it was related to bruising. He has continued asa '81mg'$ . He did not start rosuvastatin. He is hesitant as he feels cholesterol is well managed. Last LDL 101. He reports PCP rechecked it last week but he has not received results. He reports home BP readings are usually good. Usually less than 130/80. PCP stopped BP meds.   PERTINENT IMAGING/LABS  MRI  1cm acute infarct left pons CTA head and neck - pt refused  2D Echo EF 60-65%   A1C Lab Results  Component Value Date   HGBA1C 5.4 01/15/2022    Lipid Panel     Component Value Date/Time   CHOL 181 01/15/2022 0906   TRIG 139 01/15/2022 0906   HDL 50 01/15/2022 0906   CHOLHDL 3.6 01/15/2022 0906   VLDL 28 01/15/2022 0906   LDLCALC 103 (H) 01/15/2022 0906      ROS:   14 system review of systems performed and negative with exception of those listed in HPI  PMH:  Past Medical History:  Diagnosis Date   Anxiety    Arthritis    Histoplasmosis     r eye, had injec for 7 years   Hypertension    Spinal stenosis    Stroke ([facility]) 05/2021    PSH:  Past Surgical History:  Procedure Laterality Date   SHOULDER ARTHROSCOPY      Social History:  Social History   Socioeconomic History   Marital status: Widowed    Spouse name: Not on file   Number of children: 2   Years of education: Not on file   Highest education level: Bachelor's degree (e.g., BA, AB, BS)  Occupational History   Not on file  Tobacco Use   Smoking status: Never    Passive exposure: Never   Smokeless tobacco: Never  Substance and Sexual Activity   Alcohol use: Yes    Comment: occ   Drug use: Never   Sexual activity: Not on file  Other Topics Concern   Not on file  Social History Narrative   Lives alone   R handed   Caffeine: 1 C of coffee a day   Social Determinants of Health   Financial Resource Strain: Not on file  Food Insecurity: Not on file  Transportation Needs: Not on file  Physical Activity: Not on file  Stress: Not on file  Social Connections: Not on file  Intimate Partner Violence: Not on file    Family History:  Family History  Problem Relation Age of Onset   Heart disease Father     Medications:   Current Outpatient Medications on File Prior to Visit  Medication Sig Dispense Refill   alprazolam (XANAX) 2 MG tablet Take 1 tablet (2 mg total) by mouth at bedtime as needed for sleeplessness. 30 tablet 3   alprazolam (XANAX) 2 MG tablet Take 1 tablet (2 mg total) by mouth at bedtime as needed for sleeplessness. 30 tablet 3   amLODipine (NORVASC) 10 MG tablet Take 1 tablet (10 mg total) by mouth daily. 90 tablet 0   baclofen (LIORESAL) 10 MG tablet Take 1 tablet (10 mg total) by mouth at bedtime for muscle Spasm 30 tablet 3   clopidogrel (PLAVIX) 75 MG tablet Take 1 tablet by mouth daily. 30 tablet 5   hydrOXYzine (ATARAX) 10 MG tablet Take 1 tablet (10 mg total) by mouth 2 (two) times daily as needed for itching/rash 45 tablet 2    mupirocin ointment (BACTROBAN) 2 % Apply to affected area twice daily as directed 22 g 0   sertraline (ZOLOFT) 100 MG tablet Take 1 tablet by mouth two times daily  **Increased from 1 tablet daily 02/11/2022 180 tablet 1   tiZANidine (ZANAFLEX) 2 MG tablet Take 1 tablet (2 mg total) by mouth 2 (two) times daily for muscle pain. 60 tablet 3   aspirin EC 81 MG EC tablet Take 1 tablet (81 mg total) by mouth daily. Swallow whole. 30 tablet 11  Multiple Vitamins-Minerals (ONE-A-DAY MENS 50+) TABS Take 1 tablet by mouth daily.     Vitamin D, Ergocalciferol, (DRISDOL) 1.25 MG (50000 UNIT) CAPS capsule Take 5,000 Units by mouth.     No current facility-administered medications on file prior to visit.    Allergies:   Allergies  Allergen Reactions   Codeine Nausea Only   Penicillins Hives   Terbinafine Hcl Hives and Other (See Comments)    "Hives for 2 weeks"      OBJECTIVE:  Physical Exam  Vitals:   09/13/22 1501  Weight: 180 lb (81.6 kg)  Height: '6\' 1"'$  (1.854 m)   Body mass index is 23.75 kg/m. No results found.      No data to display           General: well developed, well nourished, seated, in no evident distress Head: head normocephalic and atraumatic.   Neck: supple with no carotid or supraclavicular bruits Cardiovascular: regular rate and rhythm, no murmurs Musculoskeletal: no deformity Skin:  no rash/petichiae Vascular:  Normal pulses all extremities   Neurologic Exam Mental Status: Awake and fully alert.  Fluent speech and language.  Oriented to place and time. Recent and remote memory intact. Attention span, concentration and fund of knowledge appropriate. Mood and affect appropriate.  Cranial Nerves: Fundoscopic exam reveals sharp disc margins. Pupils equal, briskly reactive to light. Extraocular movements full without nystagmus. Visual fields full to confrontation. Hearing intact. Facial sensation intact. Face, tongue, palate moves normally and symmetrically.   Motor: Normal bulk and tone. Normal strength in all tested extremity muscles with exception of 4+/5 right hip flexion Sensory.: intact to touch , pinprick , position and vibratory sensation.  Coordination: Rapid alternating movements normal in all extremities. Finger-to-nose and heel-to-shin performed accurately bilaterally. Gait and Station: Arises from chair without difficulty. Stance is normal. Gait demonstrates normal stride length and stable with walker. Slight imbalance with turns. Unable to tandem.  Reflexes: 1+ and symmetric.    NIHSS  1 Modified Rankin  0    ASSESSMENT: AYAL LAGOW is a 82 y.o. year old male presenting to ER . Vascular risk factors include HTN, HLD, advanced age, prior CVA. Marland Kitchen    PLAN:  Stroke:  Left pontine infarct  likely secondary small vessel disease source : Residual deficit: right sided weakness. Continue asa '81mg'$  and rosuvastatin for secondary stroke prevention.  Discussed secondary stroke prevention measures and importance of close PCP follow up for aggressive stroke risk factor management. I have gone over the pathophysiology of stroke, warning signs and symptoms, risk factors and their management in some detail with instructions to go to the closest emergency room for symptoms of concern. HTN: BP goal <130/90. Continue to monitor closely with PCP. per PCP HLD: LDL goal <70. Recent LDL 103. Consider starting Crestor (rosuvastatin) '20mg'$  per PCP.  Right sided weakness:continue working with PT Chronic back pain: continue close follow up with PCP. Consider referral to spine specialist.  Imbalance: continue working with PT as directed. Continue regular exercise.  Right knee pain: consider orthopedic consult, previously told he needed TKR.  Muscle cramps: discuss with PCP. Consider rechecking magnesium levels. Continue tizanidine as directed by PCP.    Follow up as needed per his request due to cost and travel time for visit. He is aware he may return if  needed.    CC:  [facility] provider: Dr. Leonie Man PCP: Charles Garibaldi, PA-C    I spent 30 minutes of face-to-face and non-face-to-face time with patient.  This  included previsit chart review including review of recent hospitalization, lab review, study review, order entry, electronic health record documentation, patient education regarding recent stroke including etiology, secondary stroke prevention measures and importance of managing stroke risk factors, residual deficits and typical recovery time and answered all other questions to patient satisfaction   Debbora Presto, [facility]  [facility] [address] [place], [place] [address]  Phone [phone] Fax [phone] Note: This document was prepared with digital dictation and possible smart phrase technology. Any transcriptional errors that result from this process are unintentional.

## 2022-09-13 ENCOUNTER — Ambulatory Visit (INDEPENDENT_AMBULATORY_CARE_PROVIDER_SITE_OTHER): Payer: PPO | Admitting: Family Medicine

## 2022-09-13 ENCOUNTER — Encounter: Payer: Self-pay | Admitting: Family Medicine

## 2022-09-13 VITALS — BP 159/87 | HR 76 | Ht 73.0 in | Wt 180.0 lb

## 2022-09-13 DIAGNOSIS — I6302 Cerebral infarction due to thrombosis of basilar artery: Secondary | ICD-10-CM | POA: Diagnosis not present

## 2022-09-13 NOTE — Patient Instructions (Addendum)
Below is our plan:  Stroke:  Left pontine infarct  likely secondary small vessel disease source : Residual deficit: right sided weakness. Continue asa '81mg'$  for secondary stroke prevention. Discuss rosuvastatin with PCP. I recommend statin therapy to help lower risk of having another stroke. Discussed secondary stroke prevention measures and importance of close PCP follow up for aggressive stroke risk factor management. I have gone over the pathophysiology of stroke, warning signs and symptoms, risk factors and their management in some detail with instructions to go to the closest emergency room for symptoms of concern. HTN: BP goal <130/90. Continue to monitor closely with PCP. per PCP HLD: LDL goal <70. Recent LDL 103. Consider starting Crestor (rosuvastatin) '20mg'$  per PCP.  Right sided weakness: continue working with PT Chronic back pain: continue close follow up with PCP. Consider referral to spine specialist. Imbalance: continue working with PT Right knee pain: consider orthopedic consult  Muscle cramps: discuss with PCP. Consider rechecking magnesium levels. Continue tizanidine as directed by PCP.  Please make sure you are staying well hydrated. I recommend 50-60 ounces daily. Well balanced diet and regular exercise encouraged. Consistent sleep schedule with 6-8 hours recommended.   Please continue follow up with care team as directed.   Follow up with me as needed   You may receive a survey regarding today's visit. I encourage you to leave honest feed back as I do use this information to improve patient care. Thank you for seeing me today!

## 2022-09-23 ENCOUNTER — Other Ambulatory Visit (HOSPITAL_COMMUNITY): Payer: Self-pay

## 2022-09-23 ENCOUNTER — Other Ambulatory Visit: Payer: Self-pay

## 2022-09-27 ENCOUNTER — Other Ambulatory Visit (HOSPITAL_COMMUNITY): Payer: Self-pay

## 2022-09-27 ENCOUNTER — Other Ambulatory Visit: Payer: Self-pay

## 2022-09-29 ENCOUNTER — Other Ambulatory Visit (HOSPITAL_COMMUNITY): Payer: Self-pay

## 2022-09-29 ENCOUNTER — Other Ambulatory Visit: Payer: Self-pay

## 2022-10-25 ENCOUNTER — Other Ambulatory Visit: Payer: Self-pay

## 2022-11-22 ENCOUNTER — Other Ambulatory Visit (HOSPITAL_COMMUNITY): Payer: Self-pay

## 2022-11-22 ENCOUNTER — Other Ambulatory Visit: Payer: Self-pay

## 2022-12-06 ENCOUNTER — Other Ambulatory Visit (HOSPITAL_COMMUNITY): Payer: Self-pay

## 2022-12-06 ENCOUNTER — Other Ambulatory Visit: Payer: Self-pay

## 2022-12-06 MED ORDER — AMLODIPINE BESYLATE 5 MG PO TABS
5.0000 mg | ORAL_TABLET | Freq: Every day | ORAL | 1 refills | Status: AC
  Filled 2022-12-06 (×2): qty 90, 90d supply, fill #0
  Filled 2023-09-29: qty 90, 90d supply, fill #1

## 2022-12-06 MED ORDER — PREGABALIN 25 MG PO CAPS
25.0000 mg | ORAL_CAPSULE | Freq: Two times a day (BID) | ORAL | 1 refills | Status: AC
  Filled 2022-12-06 (×2): qty 60, 30d supply, fill #0

## 2022-12-06 MED ORDER — ALPRAZOLAM 2 MG PO TABS
2.0000 mg | ORAL_TABLET | Freq: Every day | ORAL | 3 refills | Status: DC
  Filled 2022-12-06 – 2022-12-20 (×2): qty 30, 30d supply, fill #0
  Filled 2023-01-16: qty 30, 30d supply, fill #1
  Filled 2023-02-17: qty 30, 30d supply, fill #2
  Filled 2023-03-17: qty 30, 30d supply, fill #3

## 2022-12-21 ENCOUNTER — Other Ambulatory Visit: Payer: Self-pay

## 2023-01-03 ENCOUNTER — Other Ambulatory Visit (HOSPITAL_COMMUNITY): Payer: Self-pay

## 2023-01-04 ENCOUNTER — Other Ambulatory Visit (HOSPITAL_COMMUNITY): Payer: Self-pay

## 2023-01-04 MED ORDER — MAGNESIUM OXIDE -MG SUPPLEMENT 400 (240 MG) MG PO TABS
400.0000 mg | ORAL_TABLET | Freq: Every day | ORAL | 3 refills | Status: AC
  Filled 2023-01-04: qty 90, 90d supply, fill #0

## 2023-01-05 ENCOUNTER — Other Ambulatory Visit: Payer: Self-pay

## 2023-01-17 ENCOUNTER — Other Ambulatory Visit: Payer: Self-pay

## 2023-01-25 ENCOUNTER — Other Ambulatory Visit (HOSPITAL_COMMUNITY): Payer: Self-pay

## 2023-01-25 MED ORDER — SERTRALINE HCL 100 MG PO TABS
100.0000 mg | ORAL_TABLET | Freq: Two times a day (BID) | ORAL | 1 refills | Status: DC
Start: 2023-01-25 — End: 2023-10-18
  Filled 2023-01-25: qty 180, 90d supply, fill #0
  Filled 2023-07-27: qty 180, 90d supply, fill #1

## 2023-02-18 ENCOUNTER — Other Ambulatory Visit (HOSPITAL_COMMUNITY): Payer: Self-pay

## 2023-02-18 ENCOUNTER — Other Ambulatory Visit: Payer: Self-pay

## 2023-03-07 ENCOUNTER — Encounter: Payer: Self-pay | Admitting: Pharmacist

## 2023-03-07 ENCOUNTER — Other Ambulatory Visit: Payer: Self-pay

## 2023-03-07 ENCOUNTER — Other Ambulatory Visit (HOSPITAL_COMMUNITY): Payer: Self-pay

## 2023-03-07 MED ORDER — DOXYCYCLINE HYCLATE 50 MG PO CAPS
50.0000 mg | ORAL_CAPSULE | Freq: Two times a day (BID) | ORAL | 0 refills | Status: AC
  Filled 2023-03-07: qty 14, 7d supply, fill #0

## 2023-03-07 MED ORDER — FLUCONAZOLE 100 MG PO TABS
100.0000 mg | ORAL_TABLET | Freq: Every day | ORAL | 0 refills | Status: AC
  Filled 2023-03-07: qty 10, 10d supply, fill #0

## 2023-03-16 ENCOUNTER — Telehealth: Payer: Self-pay | Admitting: *Deleted

## 2023-03-16 NOTE — Telephone Encounter (Signed)
Left message for patient to call.

## 2023-03-16 NOTE — Telephone Encounter (Signed)
-----   Message from Amy Lomax sent at 03/16/2023  9:16 AM EDT ----- Can you guys check with patient to make sure they were able to get orders needed for foot drop brace. I had singed through PT but orders were cancelled. I just want to make sure they have what they need. ----- Message ----- From: Shelby Mattocks Job Sent: 03/16/2023   5:35 AM EDT To: Shawnie Dapper, NP

## 2023-03-17 ENCOUNTER — Other Ambulatory Visit (HOSPITAL_COMMUNITY): Payer: Self-pay

## 2023-03-23 NOTE — Telephone Encounter (Signed)
Per Amy " I went back and reviewed the notes from PT when assessed in the hospital. They did not recommend a foot drop brace. Can you please find out who he is seeing outpatient and if they have recommended any equipment. I have not been able to locate an order for the brace. "

## 2023-03-23 NOTE — Telephone Encounter (Signed)
Charles Hale based on pt reply it sounds like the order were never received by the company. Can you place a new order.

## 2023-04-17 ENCOUNTER — Other Ambulatory Visit (HOSPITAL_COMMUNITY): Payer: Self-pay

## 2023-04-18 ENCOUNTER — Other Ambulatory Visit (HOSPITAL_COMMUNITY): Payer: Self-pay

## 2023-04-20 ENCOUNTER — Other Ambulatory Visit (HOSPITAL_COMMUNITY): Payer: Self-pay

## 2023-04-21 ENCOUNTER — Other Ambulatory Visit (HOSPITAL_COMMUNITY): Payer: Self-pay

## 2023-04-21 MED ORDER — ALPRAZOLAM 2 MG PO TABS
2.0000 mg | ORAL_TABLET | Freq: Every day | ORAL | 3 refills | Status: AC
  Filled 2023-04-21: qty 30, 30d supply, fill #0
  Filled 2023-05-17 – 2023-05-19 (×2): qty 30, 30d supply, fill #1
  Filled 2023-06-15: qty 30, 30d supply, fill #2
  Filled 2023-07-11: qty 30, 30d supply, fill #3

## 2023-05-18 ENCOUNTER — Other Ambulatory Visit: Payer: Self-pay

## 2023-05-18 ENCOUNTER — Other Ambulatory Visit (HOSPITAL_COMMUNITY): Payer: Self-pay

## 2023-05-23 ENCOUNTER — Other Ambulatory Visit: Payer: Self-pay

## 2023-05-23 ENCOUNTER — Other Ambulatory Visit (HOSPITAL_COMMUNITY): Payer: Self-pay

## 2023-05-23 MED ORDER — NYSTATIN 100000 UNIT/GM EX POWD
1.0000 | Freq: Two times a day (BID) | CUTANEOUS | 1 refills | Status: AC
  Filled 2023-05-23: qty 240, 90d supply, fill #0

## 2023-05-23 MED ORDER — OMEPRAZOLE 20 MG PO CPDR
20.0000 mg | DELAYED_RELEASE_CAPSULE | Freq: Every day | ORAL | 1 refills | Status: DC
Start: 2023-05-21 — End: 2023-07-25
  Filled 2023-05-23: qty 30, 30d supply, fill #0
  Filled 2023-06-15: qty 30, 30d supply, fill #1

## 2023-05-23 MED ORDER — NAPROXEN 250 MG PO TABS
250.0000 mg | ORAL_TABLET | Freq: Two times a day (BID) | ORAL | 1 refills | Status: DC
  Filled 2023-05-23: qty 60, 30d supply, fill #0
  Filled 2023-06-15: qty 60, 30d supply, fill #1

## 2023-05-24 ENCOUNTER — Other Ambulatory Visit: Payer: Self-pay

## 2023-06-07 ENCOUNTER — Other Ambulatory Visit (HOSPITAL_COMMUNITY): Payer: Self-pay

## 2023-06-11 IMAGING — CT CT ANGIO HEAD-NECK (W OR W/O PERF)
1 of 11 series · 5 of 33 positions shown · IV contrast (omnipaque)
Comparison: None.

CLINICAL DATA: Stroke/TIA, fall yesterday with tingling and
numbness in the legs



[Series 11: axial thin · axial · 0.39mm/px · z∈[+1216,+1461]mm · 5 of 363 slices shown]
[im 61/363  soft-tissue]
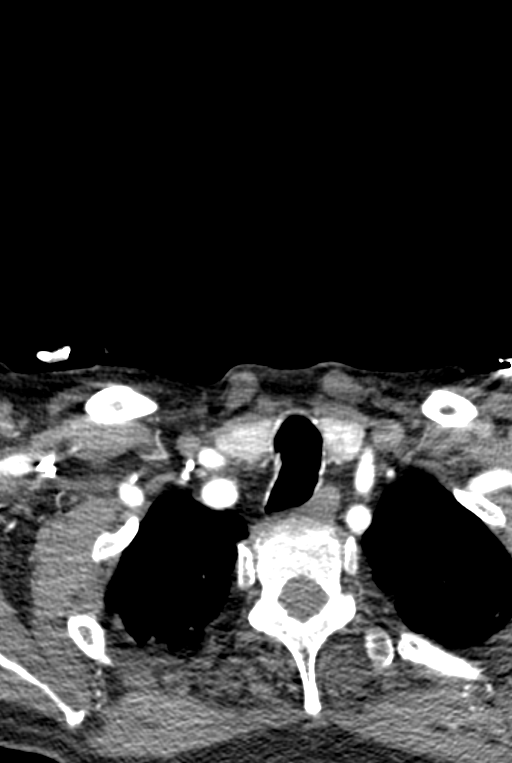
[im 121/363  bone]
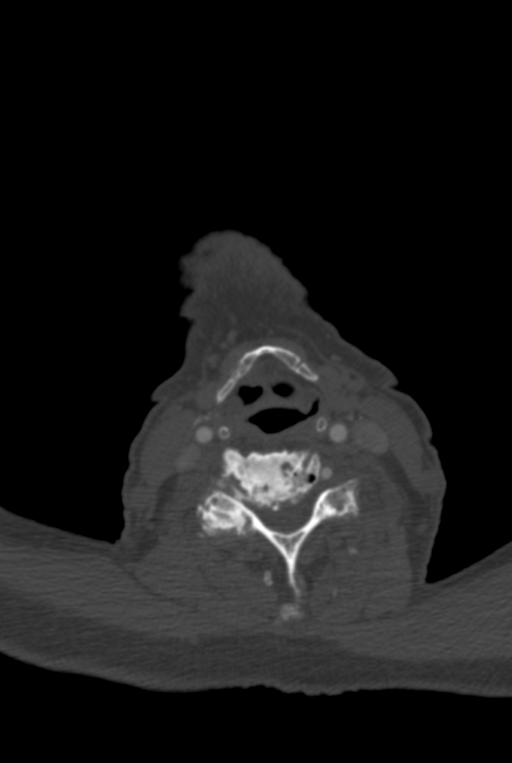
[im 182/363  soft-tissue]
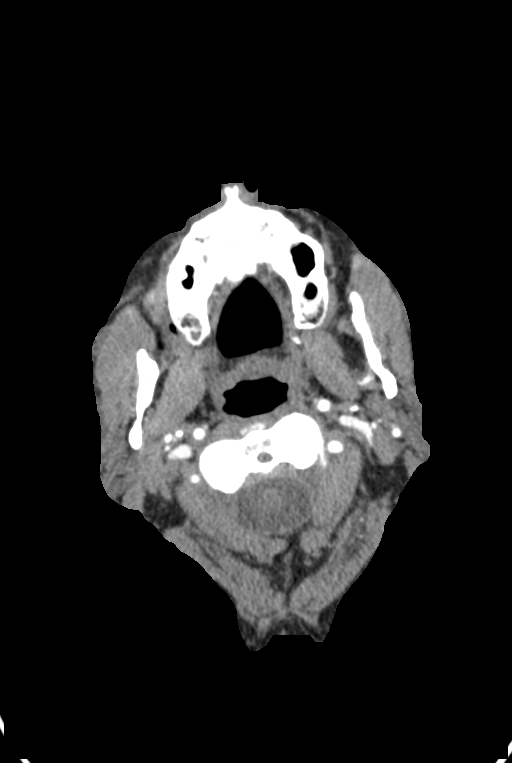
[im 242/363  bone]
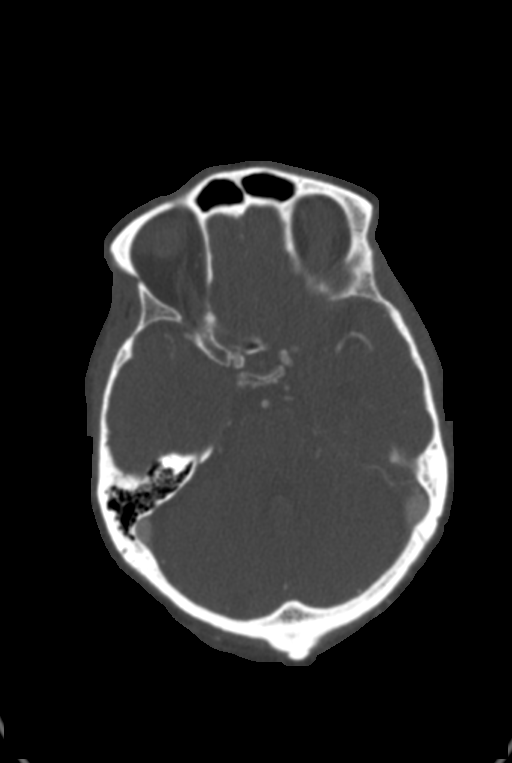
[im 302/363  soft-tissue]
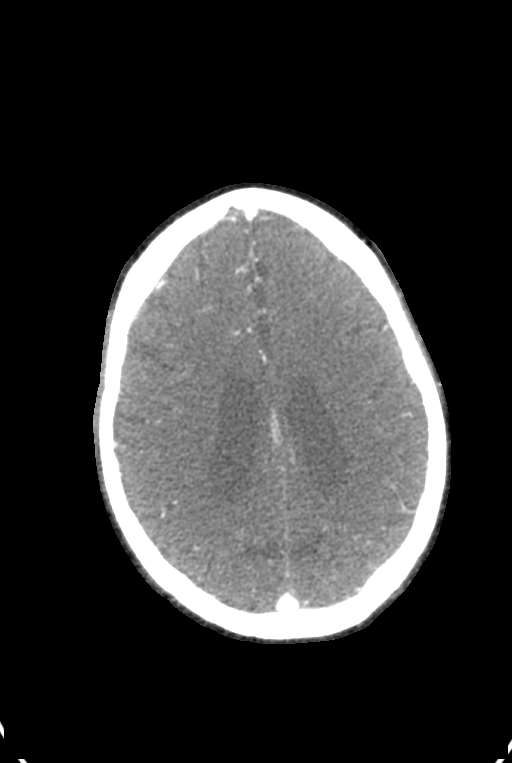

[5 of 33 positions shown; findings below may reference images not displayed]

FINDINGS: CT HEAD FINDINGS

Brain: There is no evidence of acute intracranial hemorrhage,
extra-axial fluid collection, or acute infarct.

There is mild global parenchymal volume loss. The lateral ventricles
are dilated which appears out of proportion to the degree of
parenchymal volume loss, and there is crowding of the gyri at the
vertex. Patchy hypodensity in the subcortical and periventricular
white matter is nonspecific but likely reflects sequela of chronic
white matter microangiopathy.

There is no solid mass lesion.  There is no midline shift.

Vascular: There is calcification of the bilateral cavernous ICAs.

Skull: Normal. Negative for fracture or focal lesion.

Sinuses and orbits: The paranasal sinuses are clear. Bilateral lens
implants are in place. The globes and orbits are otherwise
unremarkable.

Review of the MIP images confirms the above findings

CTA NECK FINDINGS

Aortic arch: There is mild calcified atherosclerotic plaque of the
aortic arch. The origins of the major branch vessels are patent.
There is no evidence of aneurysm or dissection to the level imaged.

Right carotid system: There is soft and calcified atherosclerotic
plaque in the proximal right internal carotid artery resulting in up
to approximately 50-60% stenosis. There is severe stenosis at the
origin of the right external carotid artery (13-77). The high
cervical internal carotid artery is tortuous. There is no evidence
of dissection or aneurysm.

Left carotid system: There is calcified atherosclerotic plaque in
the proximal left internal carotid artery resulting in up to
approximately 40-50% stenosis. The left external carotid artery is
patent. There is no evidence of dissection or aneurysm. The high
cervical left internal carotid artery is tortuous.

Vertebral arteries: The vertebral arteries are patent, without
hemodynamically significant stenosis, occlusion, dissection, or
aneurysm.

Skeleton: There is advanced multilevel degenerative change
throughout the cervical spine, most severe at C5-C6. There is
degenerative pannus about the dens resulting in mild narrowing of
the craniocervical junction. There is no visible canal hematoma.
There is no acute osseous abnormality or aggressive osseous lesion.

Other neck: The soft tissues are unremarkable.

Upper chest: There is biapical scarring. There are a few small
nodules measuring up to 4 mm.

Review of the MIP images confirms the above findings

CTA HEAD FINDINGS

Anterior circulation: There is calcified atherosclerotic plaque in
the bilateral intracranial ICAs without hemodynamically significant
stenosis or occlusion.

The bilateral MCAs are patent.

There is focal high-grade stenosis/occlusion of the left A2 segment
with distal reconstitution (11-282). The bilateral ACAs are
otherwise patent.

There is no aneurysm.

Posterior circulation:

There is a PICA termination of the right vertebral artery, a normal
variant. There is mild calcified atherosclerotic plaque in the left
V4 segment resulting in mild stenosis. The basilar artery is patent.

There is focal moderate stenosis of the proximal right P2 segment
(11-252). There is mild multifocal irregularity of the distal right
PCA. There is focal severe stenosis at the left P1/P2 junction
(11-247). The left PCA is otherwise patent with mild multifocal
irregularity of the distal branches.

There is no aneurysm.

Venous sinuses: Patent.

Anatomic variants: As above.

Review of the MIP images confirms the above findings
IMPRESSION: CT head:

1. No acute intracranial pathology.
2. Dilation of the lateral ventricles which appears out of
proportion to degree of parenchymal volume loss. This may reflect
central greater than peripheral parenchymal volume loss; however,
normal pressure hydrocephalus should be considered.
3. Mild chronic white matter microangiopathy.

CTA head and neck:

1. Focal severe stenosis/occlusion of the left A2 segment with
distal reconstitution.
2. Additional intracranial atherosclerotic disease as detailed above
resulting in up to moderate stenosis of the proximal right P2
segment and severe stenosis of the left P1/P2 junction. No other
high-grade stenosis of the intracranial vasculature.
3. Calcified atherosclerotic plaque in the bilateral carotid bulbs
resulting in up to approximately 50-60% stenosis on the right and
40-50% stenosis on the left.
4. Stenosis at the origin of the right external carotid artery.
5. Advanced multilevel degenerative changes in the cervical spine,
most severe at C5-C6.
6. Sub 6 mm pulmonary nodules in the lung apices. No follow-up
needed if patient is low-risk. Non-contrast chest CT can be
considered in 12 months if patient is high-risk. This recommendation
follows the consensus statement: Guidelines for Management of
Incidental Pulmonary Nodules Detected on CT Images: From the

## 2023-06-11 IMAGING — DX DG HIP (WITH OR WITHOUT PELVIS) 2-3V*L*
3 series · 3 of 3 positions shown · non-contrast
Comparison: None.

CLINICAL DATA: Fall.  LEFT hip pain

EXAM:
DG HIP (WITH OR WITHOUT PELVIS) 2-3V LEFT

[pelvis ap]
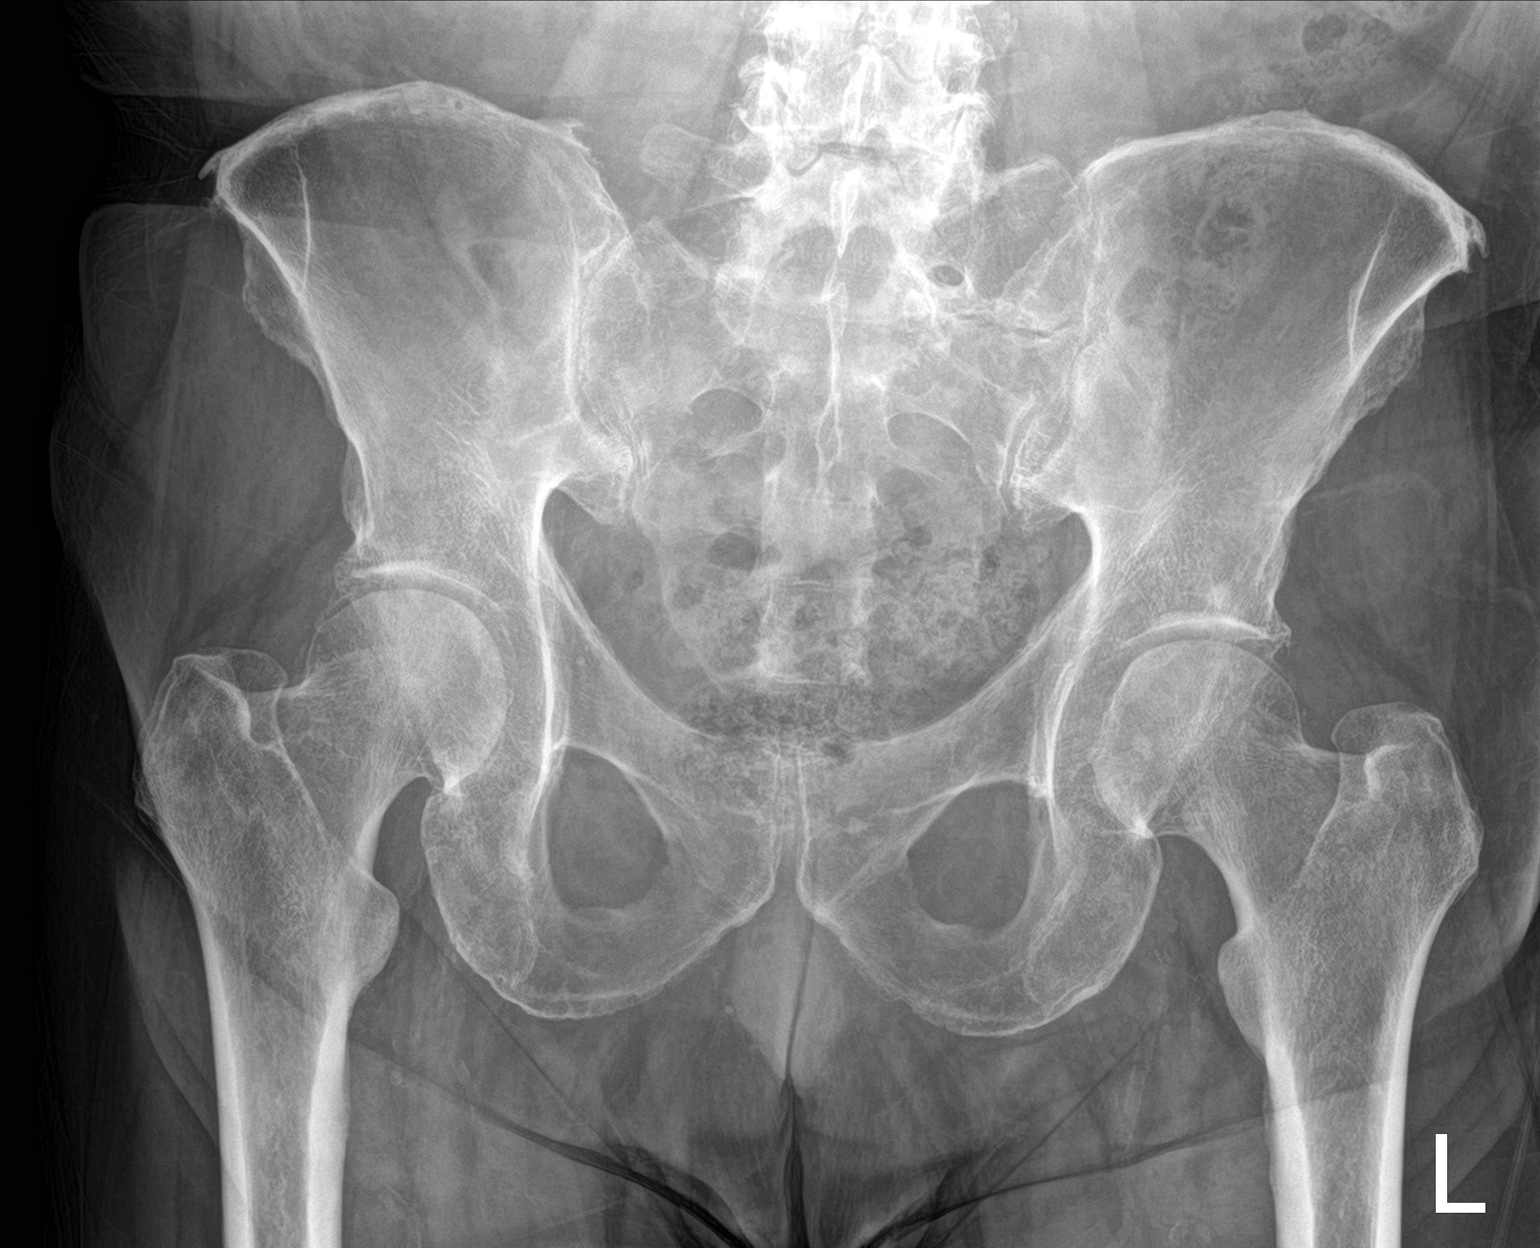

[hip ap]
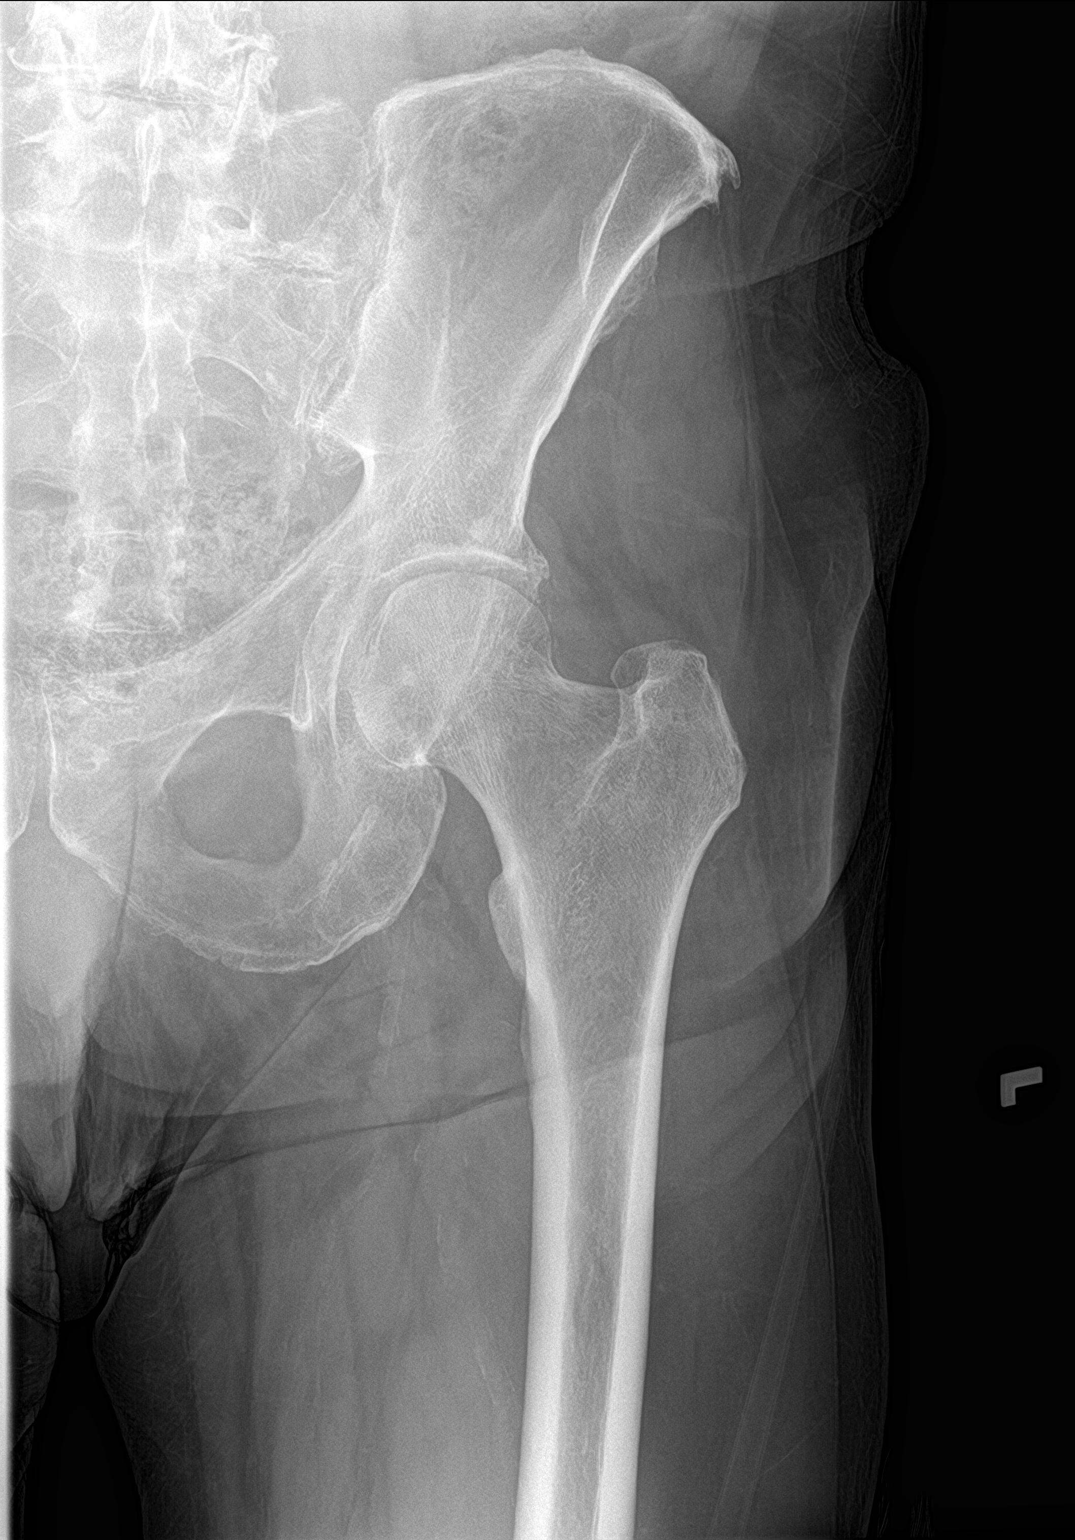

[hip lat]
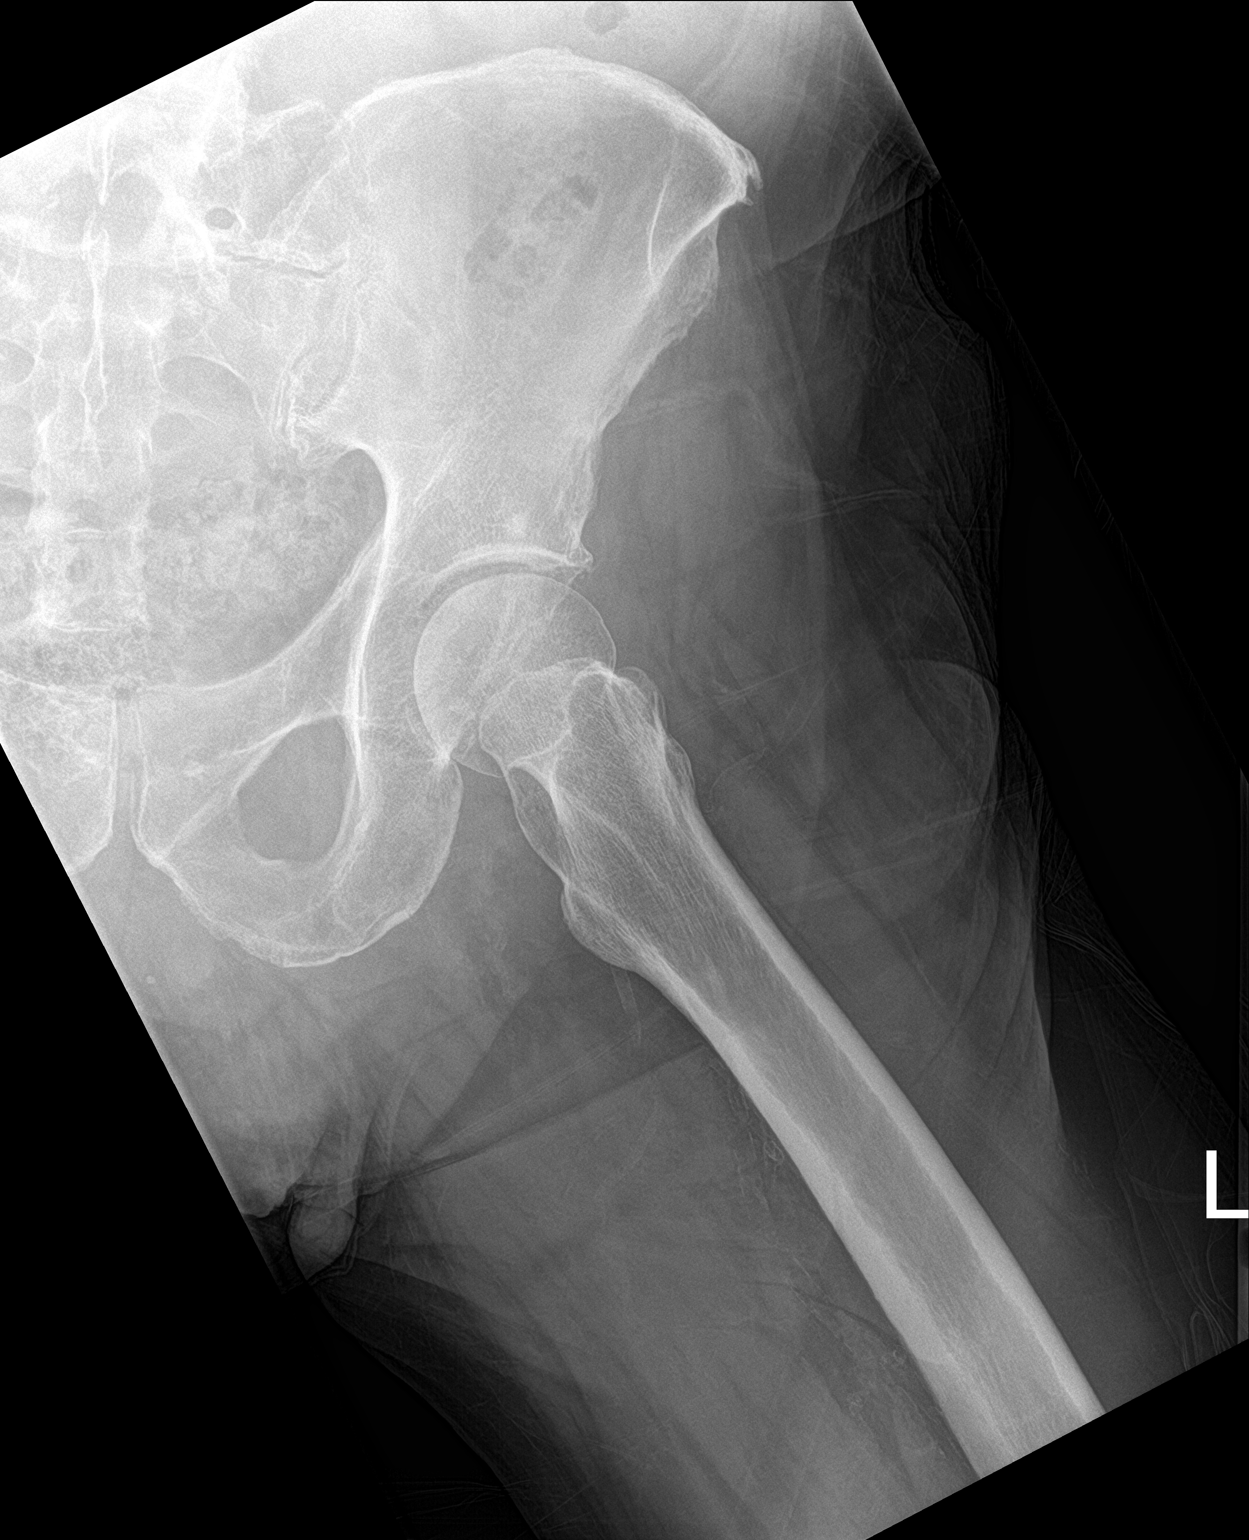

[3 of 3 positions shown; findings below may reference images not displayed]

FINDINGS: Hips are located. No evidence of pelvic fracture or sacral fracture.
Dedicated view of the LEFT hip demonstrates no femoral neck
fracture.
IMPRESSION: No pelvic fracture or hip fracture

## 2023-06-15 ENCOUNTER — Other Ambulatory Visit: Payer: Self-pay

## 2023-06-15 ENCOUNTER — Other Ambulatory Visit (HOSPITAL_COMMUNITY): Payer: Self-pay

## 2023-06-17 ENCOUNTER — Other Ambulatory Visit (HOSPITAL_COMMUNITY): Payer: Self-pay

## 2023-06-20 ENCOUNTER — Other Ambulatory Visit: Payer: Self-pay

## 2023-07-12 ENCOUNTER — Other Ambulatory Visit: Payer: Self-pay

## 2023-07-24 ENCOUNTER — Other Ambulatory Visit (HOSPITAL_COMMUNITY): Payer: Self-pay

## 2023-07-25 ENCOUNTER — Other Ambulatory Visit (HOSPITAL_COMMUNITY): Payer: Self-pay

## 2023-07-25 ENCOUNTER — Other Ambulatory Visit: Payer: Self-pay

## 2023-07-25 MED ORDER — NAPROXEN 250 MG PO TABS
250.0000 mg | ORAL_TABLET | Freq: Two times a day (BID) | ORAL | 1 refills | Status: DC
  Filled 2023-07-25: qty 60, 30d supply, fill #0
  Filled 2023-08-20: qty 60, 30d supply, fill #1

## 2023-07-25 MED ORDER — OMEPRAZOLE 20 MG PO CPDR
20.0000 mg | DELAYED_RELEASE_CAPSULE | Freq: Every day | ORAL | 1 refills | Status: DC
  Filled 2023-07-25: qty 30, 30d supply, fill #0
  Filled 2023-08-14 – 2023-08-17 (×2): qty 30, 30d supply, fill #1

## 2023-07-27 ENCOUNTER — Other Ambulatory Visit: Payer: Self-pay

## 2023-08-14 ENCOUNTER — Other Ambulatory Visit (HOSPITAL_COMMUNITY): Payer: Self-pay

## 2023-08-15 ENCOUNTER — Other Ambulatory Visit (HOSPITAL_COMMUNITY): Payer: Self-pay

## 2023-08-15 ENCOUNTER — Other Ambulatory Visit: Payer: Self-pay

## 2023-08-15 MED ORDER — ALPRAZOLAM 2 MG PO TABS
2.0000 mg | ORAL_TABLET | Freq: Every day | ORAL | 3 refills | Status: DC
  Filled 2023-08-15: qty 30, 30d supply, fill #0
  Filled 2023-09-11: qty 30, 30d supply, fill #1
  Filled 2023-10-13: qty 30, 30d supply, fill #3
  Filled 2023-10-13: qty 30, 30d supply, fill #2
  Filled 2023-11-10: qty 30, 30d supply, fill #3

## 2023-08-20 ENCOUNTER — Other Ambulatory Visit (HOSPITAL_COMMUNITY): Payer: Self-pay

## 2023-08-22 ENCOUNTER — Other Ambulatory Visit: Payer: Self-pay

## 2023-08-22 ENCOUNTER — Other Ambulatory Visit (HOSPITAL_COMMUNITY): Payer: Self-pay

## 2023-09-12 ENCOUNTER — Other Ambulatory Visit (HOSPITAL_COMMUNITY): Payer: Self-pay

## 2023-09-12 ENCOUNTER — Other Ambulatory Visit: Payer: Self-pay

## 2023-09-12 MED ORDER — NAPROXEN 250 MG PO TABS
250.0000 mg | ORAL_TABLET | Freq: Two times a day (BID) | ORAL | 1 refills | Status: DC
  Filled 2023-09-12: qty 60, 30d supply, fill #0
  Filled 2023-11-28: qty 60, 30d supply, fill #1

## 2023-09-12 MED ORDER — OMEPRAZOLE 20 MG PO CPDR
20.0000 mg | DELAYED_RELEASE_CAPSULE | Freq: Every day | ORAL | 1 refills | Status: DC
  Filled 2023-09-12: qty 30, 30d supply, fill #0
  Filled 2023-11-28: qty 30, 30d supply, fill #1

## 2023-09-12 MED ORDER — SERTRALINE HCL 200 MG PO CAPS
200.0000 mg | ORAL_CAPSULE | Freq: Every day | ORAL | 3 refills | Status: AC
  Filled 2023-09-12 – 2023-11-28 (×4): qty 90, 90d supply, fill #0

## 2023-09-13 ENCOUNTER — Other Ambulatory Visit (HOSPITAL_COMMUNITY): Payer: Self-pay

## 2023-09-13 ENCOUNTER — Other Ambulatory Visit: Payer: Self-pay

## 2023-09-13 MED ORDER — TRIAMCINOLONE ACETONIDE 0.1 % EX CREA
1.0000 | TOPICAL_CREAM | Freq: Two times a day (BID) | CUTANEOUS | 0 refills | Status: AC
  Filled 2023-09-13: qty 30, 14d supply, fill #0

## 2023-09-23 ENCOUNTER — Other Ambulatory Visit (HOSPITAL_BASED_OUTPATIENT_CLINIC_OR_DEPARTMENT_OTHER): Payer: Self-pay

## 2023-09-23 MED ORDER — HYDROCODONE-ACETAMINOPHEN 5-325 MG PO TABS
1.0000 | ORAL_TABLET | Freq: Four times a day (QID) | ORAL | 0 refills | Status: AC | PRN
  Filled 2023-09-23 – 2023-09-26 (×2): qty 20, 5d supply, fill #0

## 2023-09-26 ENCOUNTER — Other Ambulatory Visit (HOSPITAL_COMMUNITY): Payer: Self-pay

## 2023-09-26 ENCOUNTER — Other Ambulatory Visit (HOSPITAL_BASED_OUTPATIENT_CLINIC_OR_DEPARTMENT_OTHER): Payer: Self-pay

## 2023-09-27 ENCOUNTER — Other Ambulatory Visit: Payer: Self-pay

## 2023-09-30 ENCOUNTER — Other Ambulatory Visit (HOSPITAL_COMMUNITY): Payer: Self-pay

## 2023-10-13 ENCOUNTER — Other Ambulatory Visit: Payer: Self-pay

## 2023-10-13 ENCOUNTER — Other Ambulatory Visit (HOSPITAL_COMMUNITY): Payer: Self-pay

## 2023-10-18 ENCOUNTER — Other Ambulatory Visit (HOSPITAL_COMMUNITY): Payer: Self-pay

## 2023-10-18 ENCOUNTER — Other Ambulatory Visit: Payer: Self-pay

## 2023-10-18 MED ORDER — SERTRALINE HCL 100 MG PO TABS
100.0000 mg | ORAL_TABLET | Freq: Two times a day (BID) | ORAL | 1 refills | Status: AC
  Filled 2023-10-18: qty 180, 90d supply, fill #0
  Filled 2024-06-14: qty 180, 90d supply, fill #1

## 2023-11-11 ENCOUNTER — Other Ambulatory Visit: Payer: Self-pay

## 2023-11-11 ENCOUNTER — Other Ambulatory Visit (HOSPITAL_COMMUNITY): Payer: Self-pay

## 2023-11-29 ENCOUNTER — Other Ambulatory Visit: Payer: Self-pay

## 2023-11-29 ENCOUNTER — Other Ambulatory Visit (HOSPITAL_COMMUNITY): Payer: Self-pay

## 2023-12-07 ENCOUNTER — Other Ambulatory Visit (HOSPITAL_COMMUNITY): Payer: Self-pay

## 2023-12-08 ENCOUNTER — Other Ambulatory Visit (HOSPITAL_COMMUNITY): Payer: Self-pay

## 2023-12-08 ENCOUNTER — Other Ambulatory Visit: Payer: Self-pay

## 2023-12-08 MED ORDER — ALPRAZOLAM 2 MG PO TABS
2.0000 mg | ORAL_TABLET | Freq: Every day | ORAL | 3 refills | Status: AC
Start: 2023-12-07 — End: ?
  Filled 2023-12-08: qty 30, 30d supply, fill #0
  Filled 2024-01-03 – 2024-01-05 (×2): qty 30, 30d supply, fill #1
  Filled 2024-02-06: qty 30, 30d supply, fill #2
  Filled 2024-02-21 – 2024-03-03 (×2): qty 30, 30d supply, fill #3

## 2024-01-03 ENCOUNTER — Other Ambulatory Visit (HOSPITAL_COMMUNITY): Payer: Self-pay

## 2024-01-03 MED ORDER — CIPROFLOXACIN-DEXAMETHASONE 0.3-0.1 % OT SUSP
4.0000 [drp] | Freq: Two times a day (BID) | OTIC | 0 refills | Status: AC
  Filled 2024-01-03: qty 7.5, 19d supply, fill #0

## 2024-01-03 MED ORDER — ROPINIROLE HCL 0.25 MG PO TABS
0.2500 mg | ORAL_TABLET | Freq: Three times a day (TID) | ORAL | 0 refills | Status: AC
  Filled 2024-01-03: qty 90, 30d supply, fill #0

## 2024-01-04 ENCOUNTER — Encounter: Payer: Self-pay | Admitting: Pharmacist

## 2024-01-04 ENCOUNTER — Other Ambulatory Visit: Payer: Self-pay

## 2024-01-04 ENCOUNTER — Other Ambulatory Visit (HOSPITAL_COMMUNITY): Payer: Self-pay

## 2024-01-18 ENCOUNTER — Other Ambulatory Visit (HOSPITAL_COMMUNITY): Payer: Self-pay

## 2024-01-18 ENCOUNTER — Other Ambulatory Visit: Payer: Self-pay

## 2024-01-18 MED ORDER — NAPROXEN 250 MG PO TABS
250.0000 mg | ORAL_TABLET | Freq: Two times a day (BID) | ORAL | 1 refills | Status: DC
  Filled 2024-01-18: qty 60, 30d supply, fill #0
  Filled 2024-02-13: qty 60, 30d supply, fill #1

## 2024-01-18 MED ORDER — OMEPRAZOLE 20 MG PO CPDR
20.0000 mg | DELAYED_RELEASE_CAPSULE | Freq: Every day | ORAL | 1 refills | Status: AC
  Filled 2024-01-18 – 2024-01-27 (×2): qty 30, 30d supply, fill #0
  Filled 2024-02-13: qty 30, 30d supply, fill #1

## 2024-01-19 ENCOUNTER — Other Ambulatory Visit (HOSPITAL_COMMUNITY): Payer: Self-pay

## 2024-01-27 ENCOUNTER — Other Ambulatory Visit: Payer: Self-pay

## 2024-01-27 ENCOUNTER — Other Ambulatory Visit (HOSPITAL_COMMUNITY): Payer: Self-pay

## 2024-02-06 ENCOUNTER — Other Ambulatory Visit: Payer: Self-pay

## 2024-02-06 ENCOUNTER — Other Ambulatory Visit (HOSPITAL_COMMUNITY): Payer: Self-pay

## 2024-02-13 ENCOUNTER — Other Ambulatory Visit: Payer: Self-pay

## 2024-02-13 ENCOUNTER — Other Ambulatory Visit (HOSPITAL_COMMUNITY): Payer: Self-pay

## 2024-02-21 ENCOUNTER — Other Ambulatory Visit (HOSPITAL_COMMUNITY): Payer: Self-pay

## 2024-02-21 MED ORDER — ALPRAZOLAM 2 MG PO TABS
2.0000 mg | ORAL_TABLET | Freq: Every evening | ORAL | 3 refills | Status: AC
  Filled 2024-07-02: qty 30, 30d supply, fill #0
  Filled 2024-08-14 – 2024-08-16 (×5): qty 30, 30d supply, fill #1
  Filled ????-??-??: fill #1

## 2024-03-05 ENCOUNTER — Other Ambulatory Visit: Payer: Self-pay

## 2024-03-05 ENCOUNTER — Other Ambulatory Visit (HOSPITAL_COMMUNITY): Payer: Self-pay

## 2024-03-08 ENCOUNTER — Other Ambulatory Visit (HOSPITAL_COMMUNITY): Payer: Self-pay

## 2024-03-29 ENCOUNTER — Other Ambulatory Visit (HOSPITAL_COMMUNITY): Payer: Self-pay

## 2024-03-29 MED ORDER — OMEPRAZOLE 20 MG PO CPDR
20.0000 mg | DELAYED_RELEASE_CAPSULE | Freq: Every day | ORAL | 1 refills | Status: AC
  Filled 2024-03-29: qty 90, 90d supply, fill #0
  Filled 2024-07-28: qty 90, 90d supply, fill #1

## 2024-03-29 MED ORDER — AMLODIPINE BESYLATE 5 MG PO TABS
5.0000 mg | ORAL_TABLET | Freq: Every day | ORAL | 1 refills | Status: AC
  Filled 2024-03-29: qty 90, 90d supply, fill #0
  Filled 2024-06-11: qty 90, 90d supply, fill #1

## 2024-03-29 MED ORDER — ALPRAZOLAM 2 MG PO TABS
2.0000 mg | ORAL_TABLET | Freq: Every day | ORAL | 3 refills | Status: DC
  Filled 2024-03-30 – 2024-04-02 (×2): qty 30, 30d supply, fill #0
  Filled 2024-04-30: qty 30, 30d supply, fill #1
  Filled 2024-05-30: qty 30, 30d supply, fill #2
  Filled 2024-08-01: qty 30, 30d supply, fill #3

## 2024-03-29 MED ORDER — NAPROXEN 250 MG PO TABS: 250.0000 mg | ORAL_TABLET | Freq: Two times a day (BID) | ORAL | 1 refills | Status: AC

## 2024-03-30 ENCOUNTER — Other Ambulatory Visit: Payer: Self-pay

## 2024-03-30 ENCOUNTER — Other Ambulatory Visit (HOSPITAL_COMMUNITY): Payer: Self-pay

## 2024-04-02 ENCOUNTER — Other Ambulatory Visit (HOSPITAL_COMMUNITY): Payer: Self-pay

## 2024-04-24 ENCOUNTER — Other Ambulatory Visit (HOSPITAL_COMMUNITY): Payer: Self-pay

## 2024-04-24 ENCOUNTER — Other Ambulatory Visit: Payer: Self-pay

## 2024-04-24 MED ORDER — PREDNISONE 5 MG (21) PO TBPK
ORAL_TABLET | ORAL | 0 refills | Status: DC
  Filled 2024-04-24: qty 21, 6d supply, fill #0

## 2024-04-29 ENCOUNTER — Other Ambulatory Visit (HOSPITAL_COMMUNITY): Payer: Self-pay

## 2024-05-01 ENCOUNTER — Other Ambulatory Visit: Payer: Self-pay

## 2024-05-01 ENCOUNTER — Other Ambulatory Visit (HOSPITAL_COMMUNITY): Payer: Self-pay

## 2024-05-31 ENCOUNTER — Other Ambulatory Visit (HOSPITAL_COMMUNITY): Payer: Self-pay

## 2024-06-11 ENCOUNTER — Other Ambulatory Visit (HOSPITAL_COMMUNITY): Payer: Self-pay

## 2024-06-15 ENCOUNTER — Other Ambulatory Visit (HOSPITAL_COMMUNITY): Payer: Self-pay

## 2024-07-03 ENCOUNTER — Other Ambulatory Visit: Payer: Self-pay

## 2024-07-03 ENCOUNTER — Other Ambulatory Visit (HOSPITAL_COMMUNITY): Payer: Self-pay

## 2024-07-03 MED ORDER — TELMISARTAN 20 MG PO TABS
20.0000 mg | ORAL_TABLET | Freq: Every day | ORAL | 0 refills | Status: DC
  Filled 2024-07-03: qty 30, 30d supply, fill #0

## 2024-07-03 MED ORDER — NAPROXEN 250 MG PO TABS
250.0000 mg | ORAL_TABLET | Freq: Two times a day (BID) | ORAL | 1 refills | Status: AC
  Filled 2024-07-03: qty 180, 90d supply, fill #0

## 2024-07-03 MED ORDER — SERTRALINE HCL 50 MG PO TABS
50.0000 mg | ORAL_TABLET | Freq: Every day | ORAL | 0 refills | Status: AC
  Filled 2024-07-03: qty 90, 90d supply, fill #0

## 2024-07-03 MED ORDER — SERTRALINE HCL 25 MG PO TABS
25.0000 mg | ORAL_TABLET | Freq: Every day | ORAL | 0 refills | Status: AC
  Filled 2024-07-03: qty 90, 90d supply, fill #0

## 2024-07-04 ENCOUNTER — Other Ambulatory Visit: Payer: Self-pay

## 2024-07-28 ENCOUNTER — Other Ambulatory Visit (HOSPITAL_COMMUNITY): Payer: Self-pay

## 2024-08-02 ENCOUNTER — Other Ambulatory Visit (HOSPITAL_COMMUNITY): Payer: Self-pay

## 2024-08-02 ENCOUNTER — Other Ambulatory Visit: Payer: Self-pay

## 2024-08-05 ENCOUNTER — Other Ambulatory Visit (HOSPITAL_COMMUNITY): Payer: Self-pay

## 2024-08-14 ENCOUNTER — Other Ambulatory Visit: Payer: Self-pay

## 2024-08-15 ENCOUNTER — Other Ambulatory Visit: Payer: Self-pay

## 2024-08-16 ENCOUNTER — Other Ambulatory Visit (HOSPITAL_COMMUNITY): Payer: Self-pay

## 2024-08-16 ENCOUNTER — Other Ambulatory Visit (HOSPITAL_BASED_OUTPATIENT_CLINIC_OR_DEPARTMENT_OTHER): Payer: Self-pay

## 2024-08-16 ENCOUNTER — Other Ambulatory Visit: Payer: Self-pay

## 2024-08-16 MED ORDER — TELMISARTAN 20 MG PO TABS
20.0000 mg | ORAL_TABLET | Freq: Every day | ORAL | 0 refills | Status: AC
  Filled 2024-08-16: qty 30, 30d supply, fill #0

## 2024-08-23 ENCOUNTER — Other Ambulatory Visit (HOSPITAL_COMMUNITY): Payer: Self-pay

## 2024-08-23 MED ORDER — ALPRAZOLAM 2 MG PO TABS
2.0000 mg | ORAL_TABLET | Freq: Every day | ORAL | 3 refills | Status: AC
  Filled 2024-08-24: qty 30, 30d supply, fill #0
